# Patient Record
Sex: Female | Born: 1970 | Race: Black or African American | Hispanic: No | Marital: Married | Smoking: Never smoker
Health system: Southern US, Community
[De-identification: ages and names within clinical notes are randomized; demographics above are authoritative.]

## PROBLEM LIST (undated history)

## (undated) DIAGNOSIS — N6310 Unspecified lump in the right breast, unspecified quadrant: Secondary | ICD-10-CM

## (undated) DIAGNOSIS — N920 Excessive and frequent menstruation with regular cycle: Secondary | ICD-10-CM

## (undated) DIAGNOSIS — I10 Essential (primary) hypertension: Secondary | ICD-10-CM

## (undated) DIAGNOSIS — D219 Benign neoplasm of connective and other soft tissue, unspecified: Secondary | ICD-10-CM

## (undated) DIAGNOSIS — E559 Vitamin D deficiency, unspecified: Secondary | ICD-10-CM

## (undated) DIAGNOSIS — IMO0002 Reserved for concepts with insufficient information to code with codable children: Secondary | ICD-10-CM

## (undated) DIAGNOSIS — D649 Anemia, unspecified: Secondary | ICD-10-CM

## (undated) DIAGNOSIS — L680 Hirsutism: Secondary | ICD-10-CM

## (undated) HISTORY — DX: Unspecified lump in the right breast, unspecified quadrant: N63.10

## (undated) HISTORY — DX: Excessive and frequent menstruation with regular cycle: N92.0

## (undated) HISTORY — DX: Hirsutism: L68.0

## (undated) HISTORY — DX: Reserved for concepts with insufficient information to code with codable children: IMO0002

## (undated) HISTORY — PX: LIPOSUCTION: SHX10

## (undated) HISTORY — DX: Vitamin D deficiency, unspecified: E55.9

## (undated) HISTORY — PX: TUBAL LIGATION: SHX77

## (undated) HISTORY — PX: HYSTEROSCOPY WITH D & C: SHX1775

## (undated) HISTORY — PX: ABDOMINOPLASTY: SUR9

## (undated) HISTORY — DX: Benign neoplasm of connective and other soft tissue, unspecified: D21.9

---

## 1991-09-06 DIAGNOSIS — IMO0002 Reserved for concepts with insufficient information to code with codable children: Secondary | ICD-10-CM

## 1991-09-06 DIAGNOSIS — R87619 Unspecified abnormal cytological findings in specimens from cervix uteri: Secondary | ICD-10-CM

## 1991-09-06 HISTORY — DX: Unspecified abnormal cytological findings in specimens from cervix uteri: R87.619

## 1991-09-06 HISTORY — DX: Reserved for concepts with insufficient information to code with codable children: IMO0002

## 2008-09-05 HISTORY — PX: OTHER SURGICAL HISTORY: SHX169

## 2009-05-13 ENCOUNTER — Ambulatory Visit: Payer: Self-pay | Admitting: Family Medicine

## 2009-05-13 DIAGNOSIS — I10 Essential (primary) hypertension: Secondary | ICD-10-CM | POA: Insufficient documentation

## 2009-09-07 ENCOUNTER — Ambulatory Visit: Payer: Self-pay | Admitting: Family Medicine

## 2009-11-09 ENCOUNTER — Telehealth: Payer: Self-pay | Admitting: Family Medicine

## 2009-11-26 ENCOUNTER — Encounter (INDEPENDENT_AMBULATORY_CARE_PROVIDER_SITE_OTHER): Payer: Self-pay | Admitting: Cardiology

## 2009-11-26 ENCOUNTER — Ambulatory Visit (HOSPITAL_COMMUNITY): Admission: RE | Admit: 2009-11-26 | Discharge: 2009-11-26 | Payer: Self-pay | Admitting: Cardiology

## 2010-05-17 ENCOUNTER — Encounter: Admission: RE | Admit: 2010-05-17 | Discharge: 2010-05-17 | Payer: Self-pay | Admitting: Internal Medicine

## 2010-06-18 ENCOUNTER — Encounter: Admission: RE | Admit: 2010-06-18 | Discharge: 2010-06-18 | Payer: Self-pay | Admitting: Obstetrics and Gynecology

## 2010-09-26 ENCOUNTER — Encounter: Payer: Self-pay | Admitting: Internal Medicine

## 2010-10-05 NOTE — Letter (Signed)
Summary: Physician Authorization for Diet Program  Physician Authorization for Diet Program   Imported By: Maryln Gottron 09/11/2009 10:44:33  _____________________________________________________________________  External Attachment:    Type:   Image     Comment:   External Document

## 2010-10-05 NOTE — Assessment & Plan Note (Signed)
Summary: CONJUCTIVITIS // RS   Vital Signs:  Patient profile:   40 year old female Temp:     98.7 degrees F oral BP sitting:   140 / 90  (left arm) Cuff size:   large  Vitals Entered By: Sid Falcon LPN (September 07, 2009 11:58 AM) CC: Conjunctivitis both eyes?   History of Present Illness: Acute visit. 3 to four-day history of bilateral yellowish eye drainage and matting.   Mom and daughter with similar sxs. Denies blurred vision, nasal congestion, or fever. Used her daughter's sodium sulfate drops but had swelling of eye and severe burning. Uses contacts but keeping out since infection. No eye injury.  Problems Prior to Update: 1)  Family History Diabetes 1st Degree Relative  (ICD-V18.0) 2)  Hypertension  (ICD-401.9)  Allergies: 1)  Lisinopril (Lisinopril) 2)  Amoxicillin (Amoxicillin)  Review of Systems      See HPI  Physical Exam  General:  Well-developed,well-nourished,in no acute distress; alert,appropriate and cooperative throughout examination Head:  No preauricular adenopathy. Eyes:  mild conj erythema.  No crusted or purulent drainage at this time.pupils equal, pupils round, pupils reactive to light, corneas and lenses clear, no injection, no iris abnormalities, and no optic disk abnormalities.   Ears:  External ear exam shows no significant lesions or deformities.  Otoscopic examination reveals clear canals, tympanic membranes are intact bilaterally without bulging, retraction, inflammation or discharge. Hearing is grossly normal bilaterally. Nose:  External nasal examination shows no deformity or inflammation. Nasal mucosa are pink and moist without lesions or exudates.   Impression & Recommendations:  Problem # 1:  CONJUNCTIVITIS, BACTERIAL (ICD-372.03)  Her updated medication list for this problem includes:    Polytrim 10000-0.1 Unit/ml-% Soln (Polymyxin b-trimethoprim) .Marland Kitchen... 2 drops ou qid for 5 days  Complete Medication List: 1)  Amlodipine Besylate  5 Mg Tabs (Amlodipine besylate) .... One by mouth once daily 2)  Hydrochlorothiazide 25 Mg Tabs (Hydrochlorothiazide) .... One half tablet by mouth once daily 3)  Polytrim 10000-0.1 Unit/ml-% Soln (Polymyxin b-trimethoprim) .... 2 drops ou qid for 5 days Prescriptions: POLYTRIM 10000-0.1 UNIT/ML-% SOLN (POLYMYXIN B-TRIMETHOPRIM) 2 drops OU qid for 5 days  #96ml x 0   Entered and Authorized by:   Evelena Peat MD   Signed by:   Evelena Peat MD on 09/07/2009   Method used:   Electronically to        [facility] (retail)       [address]       [place], [place]  [address]       Ph: [phone]       Fax: [phone]   RxID:   [id number]

## 2010-10-05 NOTE — Progress Notes (Signed)
Summary: Pt has a new PCP-----FYI  Phone Note Call from Patient Call back at Home Phone [phone]   Caller: Patient----live call Summary of Call: Pt called and stated that she sees another primary care doctor. She said someone here called her and told her that she will need an ov with Dr Caryl Never. Initial call taken by: Warnell Forester,  November 09, 2009 11:05 AM  Follow-up for Phone Call        noted. Follow-up by: Evelena Peat MD,  November 09, 2009 12:52 PM

## 2011-08-12 ENCOUNTER — Emergency Department (HOSPITAL_COMMUNITY): Payer: BC Managed Care – PPO

## 2011-08-12 ENCOUNTER — Emergency Department (HOSPITAL_COMMUNITY)
Admission: EM | Admit: 2011-08-12 | Discharge: 2011-08-12 | Disposition: A | Discharge status: Home / Self Care | Payer: BC Managed Care – PPO | Attending: Emergency Medicine | Admitting: Emergency Medicine

## 2011-08-12 DIAGNOSIS — R05 Cough: Secondary | ICD-10-CM | POA: Insufficient documentation

## 2011-08-12 DIAGNOSIS — I10 Essential (primary) hypertension: Secondary | ICD-10-CM | POA: Insufficient documentation

## 2011-08-12 DIAGNOSIS — IMO0001 Reserved for inherently not codable concepts without codable children: Secondary | ICD-10-CM | POA: Insufficient documentation

## 2011-08-12 DIAGNOSIS — R5383 Other fatigue: Secondary | ICD-10-CM | POA: Insufficient documentation

## 2011-08-12 DIAGNOSIS — J069 Acute upper respiratory infection, unspecified: Secondary | ICD-10-CM | POA: Insufficient documentation

## 2011-08-12 DIAGNOSIS — R509 Fever, unspecified: Secondary | ICD-10-CM | POA: Insufficient documentation

## 2011-08-12 DIAGNOSIS — R059 Cough, unspecified: Secondary | ICD-10-CM | POA: Insufficient documentation

## 2011-08-12 DIAGNOSIS — R0602 Shortness of breath: Secondary | ICD-10-CM | POA: Insufficient documentation

## 2011-08-12 DIAGNOSIS — R51 Headache: Secondary | ICD-10-CM | POA: Insufficient documentation

## 2011-08-12 DIAGNOSIS — R5381 Other malaise: Secondary | ICD-10-CM | POA: Insufficient documentation

## 2011-08-12 HISTORY — DX: Anemia, unspecified: D64.9

## 2011-08-12 HISTORY — DX: Essential (primary) hypertension: I10

## 2011-08-12 LAB — DIFFERENTIAL
Basophils Relative: 1 % (ref 0–1)
Eosinophils Absolute: 0 10*3/uL (ref 0.0–0.7)
Eosinophils Relative: 0 % (ref 0–5)
Lymphocytes Relative: 39 % (ref 12–46)
Monocytes Relative: 9 % (ref 3–12)
Neutro Abs: 2.4 10*3/uL (ref 1.7–7.7)
Neutrophils Relative %: 51 % (ref 43–77)

## 2011-08-12 LAB — CBC
HCT: 33.3 % — ABNORMAL LOW (ref 36.0–46.0)
Hemoglobin: 11.1 g/dL — ABNORMAL LOW (ref 12.0–15.0)
RBC: 3.84 MIL/uL — ABNORMAL LOW (ref 3.87–5.11)

## 2011-08-12 LAB — PREGNANCY, URINE: Preg Test, Ur: NEGATIVE

## 2011-08-12 MED ORDER — OXYCODONE-ACETAMINOPHEN 5-325 MG PO TABS: 1.0000 | ORAL_TABLET | ORAL | Status: AC | PRN

## 2011-08-12 MED ORDER — SODIUM CHLORIDE 0.9 % IV BOLUS (SEPSIS)
500.0000 mL | Freq: Once | INTRAVENOUS | Status: AC
  Administered 2011-08-12: 500 mL via INTRAVENOUS

## 2011-08-12 NOTE — ED Notes (Signed)
Here with c/o sob and weakness as well as continued cough and congestion after week long flu and fever.  Pt denies pain at this time.

## 2011-08-12 NOTE — Discharge Instructions (Signed)

## 2011-08-12 NOTE — ED Notes (Signed)
Pt presents with 1 week h/o 5 day h/o fever and generalized body aches that began suddenly.  Pt reports cough that began yesteday, is dry, but pt reports shortness of breath.  +headaches; -nausea or vomiting.

## 2011-08-12 NOTE — ED Provider Notes (Signed)
History     CSN: [id number] Arrival date & time: 08/12/2011 12:42 PM   First MD Initiated Contact with Patient 08/12/11 1424      Chief Complaint  Patient presents with  . Fever    (Consider location/radiation/quality/duration/timing/severity/associated sxs/prior treatment) Patient is a 40 y.o. female presenting with fever. The history is provided by the patient.  Fever Primary symptoms of the febrile illness include fever, fatigue, cough, shortness of breath and myalgias. Primary symptoms do not include abdominal pain. The current episode started 3 to 5 days ago.   patient has had fever and myalgias for the last 5 days. She a cough that began yesterday. Today she has increasing shortness of breath. She does have some headaches. She states that she has a history of low iron. She states her hemoglobin is 7. She was previously on iron, but she stopped it after her period stop on spironolactone. She's since had a couple periods and was not back on the iron. She's had fatigue has not been able to walk and drive as much. No chest pain. No nausea vomiting diarrhea. No melena. No relief with cough medicine from the pharmacy.  Past Medical History  Diagnosis Date  . Hypertension   . Anemia     Past Surgical History  Procedure Date  . Cesarean section     No family history on file.  History  Substance Use Topics  . Smoking status: Never Smoker   . Smokeless tobacco: Not on file  . Alcohol Use: No    OB History    Grav Para Term Preterm Abortions TAB SAB Ect Mult Living                  Review of Systems  Constitutional: Positive for fever, appetite change and fatigue.  HENT: Negative for sore throat and neck pain.   Respiratory: Positive for cough and shortness of breath.   Cardiovascular: Negative for chest pain.  Gastrointestinal: Negative for abdominal pain and constipation.  Genitourinary: Negative for vaginal bleeding.  Musculoskeletal: Positive for myalgias. Negative  for back pain.  Neurological: Negative for numbness.  Psychiatric/Behavioral: Negative for confusion.    Allergies  Amoxicillin; Lisinopril; and Morphine and related  Home Medications   Current Outpatient Rx  Name Route Sig Dispense Refill  . BISACODYL 5 MG PO TBEC Oral Take 10 mg by mouth daily as needed. constipation     . DILTIAZEM HCL ER 180 MG PO CP24 Oral Take 180 mg by mouth daily.      Arnette Schaumann PLUS PO Oral Take 1 capsule by mouth daily.      Marland Kitchen SPIRONOLACTONE 50 MG PO TABS Oral Take 25 mg by mouth 2 (two) times daily.      . OXYCODONE-ACETAMINOPHEN 5-325 MG PO TABS Oral Take 1 tablet by mouth every 4 (four) hours as needed for pain. 15 tablet 0    BP 133/82  Pulse 64  Temp(Src) 98 F (36.7 C) (Oral)  Resp 15  Ht 5\' 7"  (1.702 m)  Wt 162 lb (73.483 kg)  BMI 25.37 kg/m2  SpO2 100%  LMP 08/12/2011  Physical Exam  Nursing note and vitals reviewed. Constitutional: She is oriented to person, place, and time. She appears well-developed and well-nourished.  HENT:  Head: Normocephalic and atraumatic.  Mouth/Throat: No oropharyngeal exudate.       Mild posterior pharyngeal erythema without exudate.  Eyes: EOM are normal. Pupils are equal, round, and reactive to light.  Neck: Normal range of motion.  Neck supple.  Cardiovascular: Normal rate, regular rhythm and normal heart sounds.   No murmur heard. Pulmonary/Chest: Effort normal and breath sounds normal. No respiratory distress. She has no wheezes. She has no rales.  Abdominal: Soft. Bowel sounds are normal. She exhibits no distension. There is no tenderness. There is no rebound and no guarding.  Musculoskeletal: Normal range of motion.  Neurological: She is alert and oriented to person, place, and time. No cranial nerve deficit.  Skin: Skin is warm and dry.  Psychiatric: She has a normal mood and affect. Her speech is normal.    ED Course  Procedures (including critical care time)  Labs Reviewed  CBC - Abnormal;  Notable for the following:    RBC 3.84 (*)    Hemoglobin 11.1 (*)    HCT 33.3 (*)    All other components within normal limits  DIFFERENTIAL  PREGNANCY, URINE   Dg Chest 2 View  08/12/2011  *RADIOLOGY REPORT*  Clinical Data: Fever and shortness of breath.  CHEST - 2 VIEW  Comparison: None.  Findings: Lungs are clear.  Heart size is normal.  No pneumothorax or pleural effusion.  IMPRESSION: Negative chest.  Original Report Authenticated By: Bernadene Bell. D'ALESSIO, M.D.     1. URI (upper respiratory infection)       MDM  Cough and fevers. Reassuring x-ray. History of anemia hemoglobin is now 11. She'll be discharged home.        Juliet Rude. Rubin Payor, MD 08/12/11 [phone]

## 2011-09-09 ENCOUNTER — Other Ambulatory Visit: Payer: Self-pay | Admitting: Obstetrics and Gynecology

## 2011-09-09 DIAGNOSIS — N6310 Unspecified lump in the right breast, unspecified quadrant: Secondary | ICD-10-CM

## 2011-09-15 ENCOUNTER — Ambulatory Visit
Admission: RE | Admit: 2011-09-15 | Discharge: 2011-09-15 | Disposition: A | Discharge status: Home / Self Care | Payer: BC Managed Care – PPO | Source: Ambulatory Visit | Attending: Obstetrics and Gynecology | Admitting: Obstetrics and Gynecology

## 2011-09-15 ENCOUNTER — Other Ambulatory Visit: Payer: Self-pay | Admitting: Obstetrics and Gynecology

## 2011-09-15 DIAGNOSIS — N6310 Unspecified lump in the right breast, unspecified quadrant: Secondary | ICD-10-CM

## 2011-09-18 DIAGNOSIS — N63 Unspecified lump in unspecified breast: Secondary | ICD-10-CM | POA: Insufficient documentation

## 2012-04-16 ENCOUNTER — Other Ambulatory Visit: Payer: Self-pay | Admitting: Obstetrics and Gynecology

## 2012-04-18 ENCOUNTER — Other Ambulatory Visit: Payer: Self-pay

## 2012-04-18 ENCOUNTER — Other Ambulatory Visit: Payer: Self-pay | Admitting: Obstetrics and Gynecology

## 2012-04-18 MED ORDER — INTEGRA PLUS PO CAPS: 1.0000 | ORAL_CAPSULE | Freq: Every day | ORAL | Status: DC

## 2012-04-18 NOTE — Telephone Encounter (Signed)
Spoke with pt informed rx sent to pharm pt voice understanding 

## 2012-04-18 NOTE — Telephone Encounter (Signed)
NIC[facility]LE/RX °

## 2012-05-22 ENCOUNTER — Encounter: Payer: Self-pay | Admitting: Obstetrics and Gynecology

## 2012-05-30 ENCOUNTER — Other Ambulatory Visit: Payer: Self-pay

## 2012-05-30 MED ORDER — FERROUS SULFATE 325 (65 FE) MG PO TABS: ORAL_TABLET | ORAL | Status: DC

## 2012-06-07 ENCOUNTER — Ambulatory Visit (INDEPENDENT_AMBULATORY_CARE_PROVIDER_SITE_OTHER): Payer: BC Managed Care – PPO | Admitting: Obstetrics and Gynecology

## 2012-06-07 ENCOUNTER — Encounter: Payer: Self-pay | Admitting: Obstetrics and Gynecology

## 2012-06-07 VITALS — BP 140/82 | Wt 187.0 lb

## 2012-06-07 DIAGNOSIS — N920 Excessive and frequent menstruation with regular cycle: Secondary | ICD-10-CM

## 2012-06-07 NOTE — Progress Notes (Signed)
When did bleeding start: 05/31/12 How  Long: 5 days How often changing pad/tampon: every 2 hours Bleeding Disorders: yes anemic Cramping: yes Contraception: yes BTL Fibroids: yes Hormone Therapy: no New Medications: no Menopausal Symptoms: no Vag. Discharge: no Abdominal Pain: no Increased Stress: no Pt states that her periods last the same amount of days as normal but are much heavier. She changes an ultra tampon q 1-2 hrs.  She is anemic.  She never took the lysteda and her menses is q 18-27 days.   Physical Examination: General appearance - alert, well appearing, and in no distress Heart - normal rate and regular rhythm Abdomen - soft, nontender, nondistended, no masses or organomegaly Pelvic - normal external genitalia, vulva, vagina, cervix, uterus and adnexa Menorrhagia EMBX/SHG @[place]  micronor to help stop the bleeding

## 2012-06-11 ENCOUNTER — Other Ambulatory Visit: Payer: Self-pay | Admitting: Obstetrics and Gynecology

## 2012-06-11 ENCOUNTER — Telehealth: Payer: Self-pay

## 2012-06-11 MED ORDER — NORETHINDRONE 0.35 MG PO TABS: 1.0000 | ORAL_TABLET | Freq: Every day | ORAL | Status: DC

## 2012-06-11 NOTE — Telephone Encounter (Signed)
VM message from pt requesting rx ND was supposed to send after 06/07/12 visit. Pt didn't indicate which med.

## 2012-06-11 NOTE — Telephone Encounter (Signed)
Tc to pt regarding msg.  Lm on vm that Micronor has been e-prescribed to pt's pharmacy, pt to call office with any questions.

## 2012-06-13 ENCOUNTER — Telehealth: Payer: Self-pay

## 2012-06-13 NOTE — Telephone Encounter (Signed)
Spoke with pt informed her [facility] was [facility] per [facility] and to call back if she has any other issues. Pt agrees and understands.

## 2012-06-13 NOTE — Telephone Encounter (Signed)
VM from pt. Pt states pharmacy didn't receive rx for Micronor.

## 2012-09-20 ENCOUNTER — Ambulatory Visit: Payer: BC Managed Care – PPO | Admitting: Obstetrics and Gynecology

## 2012-09-20 ENCOUNTER — Encounter: Payer: Self-pay | Admitting: Obstetrics and Gynecology

## 2012-09-20 ENCOUNTER — Ambulatory Visit: Payer: BC Managed Care – PPO

## 2012-09-20 VITALS — BP 140/82 | HR 84 | Wt 181.0 lb

## 2012-09-20 DIAGNOSIS — D219 Benign neoplasm of connective and other soft tissue, unspecified: Secondary | ICD-10-CM

## 2012-09-20 DIAGNOSIS — N920 Excessive and frequent menstruation with regular cycle: Secondary | ICD-10-CM

## 2012-09-20 LAB — POCT URINE PREGNANCY: Preg Test, Ur: NEGATIVE

## 2012-09-20 NOTE — Addendum Note (Signed)
Addended by: Henreitta Leber on: 09/20/2012 01:53 PM   Modules accepted: Orders

## 2012-09-20 NOTE — Patient Instructions (Signed)
Uterine Artery Embolization for Fibroids Uterine fibroids are non-cancerous (benign) smooth muscle tumors of the uterus. When they become large, they may produce symptoms of pain and bleeding. Fibroids are sometimes individually removed during surgery or removed with the uterus (hysterectomy).  One non-surgical treatment used to shrink fibroids is called uterine artery embolization. A specialist (interventional radiologist) uses a thin plastic hose(catheter) to inject material that blocks off the blood supply to the fibroid. In time, this causes the fibroid to shrink. PROCEDURE  Under local anesthetic (a medication that numbs part of the body) the radiologist makes a small cut in the groin. A catheter is then inserted into the main artery of the leg. Using fluoroscopy, your radiologist guides the catheter through the artery to the uterus. A series of images are taken while dye is injected. This is done to provide a road map of the blood supply to the uterus and fibroids. Tiny plastic spheres about the size of sand grains are then injected through the catheter. Metal coils may sometimes also be used to help block the artery. The particles lodge in tiny branches of the uterine artery that supplies blood to the fibroids. The procedure is repeated on the artery that supplies the other side of the uterus. The hospital stay is usually overnight. Normal activity can resume after about a week. Mild pain and cramping following the procedure is easily treated with medication and anti-inflammatory drugs. These usually last only a couple days.  RISKS AND COMPLICATIONS  Injury to the uterus from decreased blood supply may happen.  This could require removal of the uterus (hysterectomy).  Pain and bleeding can occur.  Infection and abscess (a cyst filled with pus).  A cyst filled with blood (hematoma).  Blood infection (septicemia).  Amenorrhea (no menstrual period).  Dying of tissue cells that cannot recover  (necrosis) to the bladder or lips of the vagina.  Fistula (a connection between organs or from organ to the skin).  Blood clot in the lung (pulmonary embolus).  Rarely death. EXPECTED OUTCOME An ultrasound or MRI is done in 6 months to make sure the fibroids have shrunk. The fibroids usually shrink to about half their original size. In most cases these effects are long lasting.   The uterus also shrinks but does not die. You may not be able to get pregnant following this procedure.  It cannot be estimated what the effects of the procedure will be on menses. Usually there is less bleeding.  The procedure may cause premature menopause or loss of menstrual cycle. HOME CARE INSTRUCTIONS   Follow your caregiver's advice regarding medications given to you, diet, activity and when to begin sexual activity.  See your caregiver for follow up care as directed.  Do not take aspirin it can cause bleeding. Only take over-the-counter or prescription medicines for pain, discomfort, or fever as directed by your caregiver.  Care for and change dressing as directed. SEEK MEDICAL CARE IF:   You develop a temperature of 102 F (38.9 C) or higher.  There is redness, swelling and pain around the wound.  You have pus draining from the wound.  You develop a rash. SEEK IMMEDIATE MEDICAL CARE IF:   You have bleeding from the wound.  You have difficulty breathing.  You develop chest pain.  You develop belly (abdominal) pain.  You develop leg pain.  You become dizzy and pass out. Document Released: 11/07/2005 Document Revised: 11/14/2011 Document Reviewed: 10/04/2007 [facility] Patient Information 2013 [place], [place].

## 2012-09-20 NOTE — Progress Notes (Addendum)
42 YO with fibroids  presents for a sono-hysterogram and endometrial biopsy because of persistent menorrhagia. Patient previously had been given management options to include ablation, Mirena, fibroid embolization, myomectomy and hormonal therapy however she wants to manage her fibroids and not just her bleeding.  O: U/S-SHG-uterus 9.43 x 8.92 x 7.31 cm with normal appearing ovaries;  #3 measurable fibroids: anterior right myometrial 6.0 x 5.5 x 5.4 cm; anterior fundal?subserosal: 3.4 x 3.8 x 3.7 cm; and anterior mid-uterine-myometrial 1.8 x 1.6 x 1.9 cm; Saline infusion of uterine cavity did not reveal any focal lesions, normal cavity shape and endometrial walls appear smooth (confirmed by 3D imaging)  UPT-negative  A: Menorrhagia     Fibroids     SHG & Endometrial Biopsy     S/P Tubal Sterilization  P:  Patient states she wants fibroid embolization-referrred for consult with interventional radiologist       Endometrial biopsy sampling to pathology-pending       RTO-AEx with Dr. Normand Sloop or prn  Henreitta Leber, PA-C

## 2012-09-24 LAB — PATHOLOGY

## 2012-10-01 ENCOUNTER — Telehealth: Payer: Self-pay

## 2012-10-01 DIAGNOSIS — D219 Benign neoplasm of connective and other soft tissue, unspecified: Secondary | ICD-10-CM

## 2012-10-01 NOTE — Telephone Encounter (Signed)
Spoke with pt informed lab results informed referral to dr.yamatgata for [place] pt has appt 10/02/12 at 10:00 pt voice understanding pt will call to r/s appt if unable to get off work

## 2012-10-02 ENCOUNTER — Inpatient Hospital Stay: Admission: RE | Admit: 2012-10-02 | Payer: BC Managed Care – PPO | Source: Ambulatory Visit

## 2012-10-10 ENCOUNTER — Telehealth: Payer: Self-pay

## 2012-10-10 NOTE — Telephone Encounter (Signed)
VM from pt request Intergra Plus rf. Lm for pt to c/b.

## 2012-10-11 ENCOUNTER — Telehealth: Payer: Self-pay

## 2012-10-11 NOTE — Telephone Encounter (Signed)
Please see msg

## 2012-10-16 NOTE — Telephone Encounter (Signed)
Spoke with pt requesting Integra Plus. Pt needs labs before ND is able to refill her iron per NG. Pt has an appt Feb. 27th & ND will evaluate her then. Pt agrees and has no further questions.

## 2012-11-01 ENCOUNTER — Ambulatory Visit: Payer: BC Managed Care – PPO | Admitting: Obstetrics and Gynecology

## 2012-11-02 ENCOUNTER — Telehealth: Payer: Self-pay | Admitting: Obstetrics and Gynecology

## 2012-11-02 NOTE — Telephone Encounter (Signed)
Call transferred to MB for scheduling.

## 2012-11-05 ENCOUNTER — Encounter: Payer: BC Managed Care – PPO | Admitting: Obstetrics and Gynecology

## 2012-11-07 ENCOUNTER — Encounter: Payer: Self-pay | Admitting: Certified Nurse Midwife

## 2012-11-07 ENCOUNTER — Ambulatory Visit: Payer: BC Managed Care – PPO | Admitting: Certified Nurse Midwife

## 2012-11-07 VITALS — BP 130/70 | Ht 67.0 in | Wt 186.0 lb

## 2012-11-07 DIAGNOSIS — N926 Irregular menstruation, unspecified: Secondary | ICD-10-CM

## 2012-11-08 ENCOUNTER — Telehealth: Payer: Self-pay | Admitting: Obstetrics and Gynecology

## 2012-11-08 MED ORDER — METRONIDAZOLE 500 MG PO TABS: 500.0000 mg | ORAL_TABLET | Freq: Three times a day (TID) | ORAL | Status: AC

## 2012-11-08 MED ORDER — FLUCONAZOLE 100 MG PO TABS: ORAL_TABLET | ORAL | Status: DC

## 2012-11-08 NOTE — Telephone Encounter (Signed)
Pt was seen by CA 11-07-2012 Pt is calling regarding Rx not being sent There is not a completed office note by CA in pt chart. Will have to consult w/ CA today  Pt called to be made aware  Pt not ava left message on pt voicemail  LC CMA

## 2012-11-25 NOTE — Progress Notes (Signed)
Pt. Here with c/o vagina d/c with irritation No vaginal lesions noted Cycles irreg. At times Pap is due O Alert and oriented  Thyroid nl  Heart and lungs nl  Back: no CVAT  Abd: soft and non tender  Bimanual - creamy whitish d/c without lesions   Pelvic WNL A Vaginitis  P Condoms encouraged  Pap done  Wet Prep done-Yeast  Diflucan 150 mgs

## 2013-01-07 ENCOUNTER — Other Ambulatory Visit: Payer: Self-pay

## 2013-01-07 DIAGNOSIS — Z1231 Encounter for screening mammogram for malignant neoplasm of breast: Secondary | ICD-10-CM

## 2013-02-12 ENCOUNTER — Ambulatory Visit: Payer: BC Managed Care – PPO

## 2013-05-24 ENCOUNTER — Ambulatory Visit: Payer: BC Managed Care – PPO

## 2013-06-07 ENCOUNTER — Ambulatory Visit: Payer: BC Managed Care – PPO

## 2013-06-14 ENCOUNTER — Ambulatory Visit
Admission: RE | Admit: 2013-06-14 | Discharge: 2013-06-14 | Disposition: A | Discharge status: Home / Self Care | Payer: BC Managed Care – PPO | Source: Ambulatory Visit

## 2013-06-14 DIAGNOSIS — Z1231 Encounter for screening mammogram for malignant neoplasm of breast: Secondary | ICD-10-CM

## 2013-10-09 ENCOUNTER — Ambulatory Visit (INDEPENDENT_AMBULATORY_CARE_PROVIDER_SITE_OTHER): Payer: BC Managed Care – PPO | Admitting: Emergency Medicine

## 2013-10-09 VITALS — BP 156/88 | HR 86 | Temp 98.6°F | Resp 17 | Ht 67.0 in | Wt 185.0 lb

## 2013-10-09 DIAGNOSIS — J020 Streptococcal pharyngitis: Secondary | ICD-10-CM

## 2013-10-09 DIAGNOSIS — J029 Acute pharyngitis, unspecified: Secondary | ICD-10-CM

## 2013-10-09 LAB — POCT RAPID STREP A (OFFICE): RAPID STREP A SCREEN: POSITIVE — AB

## 2013-10-09 MED ORDER — CLINDAMYCIN HCL 300 MG PO CAPS: 300.0000 mg | ORAL_CAPSULE | Freq: Three times a day (TID) | ORAL | Status: DC

## 2013-10-09 NOTE — Patient Instructions (Signed)
Strep Throat  Strep throat is an infection of the throat caused by a bacteria named Streptococcus pyogenes. Your caregiver may call the infection streptococcal "tonsillitis" or "pharyngitis" depending on whether there are signs of inflammation in the tonsils or back of the throat. Strep throat is most common in children aged 44 15 years during the cold months of the year, but it can occur in people of any age during any season. This infection is spread from person to person (contagious) through coughing, sneezing, or other close contact.  SYMPTOMS   · Fever or chills.  · Painful, swollen, red tonsils or throat.  · Pain or difficulty when swallowing.  · White or yellow spots on the tonsils or throat.  · Swollen, tender lymph nodes or "glands" of the neck or under the jaw.  · Red rash all over the body (rare).  DIAGNOSIS   Many different infections can cause the same symptoms. A test must be done to confirm the diagnosis so the right treatment can be given. A "rapid strep test" can help your caregiver make the diagnosis in a few minutes. If this test is not available, a light swab of the infected area can be used for a throat culture test. If a throat culture test is done, results are usually available in a day or two.  TREATMENT   Strep throat is treated with antibiotic medicine.  HOME CARE INSTRUCTIONS   · Gargle with 1 tsp of salt in 1 cup of warm water, 3 4 times per day or as needed for comfort.  · Family members who also have a sore throat or fever should be tested for strep throat and treated with antibiotics if they have the strep infection.  · Make sure everyone in your household washes their hands well.  · Do not share food, drinking cups, or personal items that could cause the infection to spread to others.  · You may need to eat a soft food diet until your sore throat gets better.  · Drink enough water and fluids to keep your urine clear or pale yellow. This will help prevent dehydration.  · Get plenty of  rest.  · Stay home from school, daycare, or work until you have been on antibiotics for 24 hours.  · Only take over-the-counter or prescription medicines for pain, discomfort, or fever as directed by your caregiver.  · If antibiotics are prescribed, take them as directed. Finish them even if you start to feel better.  SEEK MEDICAL CARE IF:   · The glands in your neck continue to enlarge.  · You develop a rash, cough, or earache.  · You cough up green, yellow-brown, or bloody sputum.  · You have pain or discomfort not controlled by medicines.  · Your problems seem to be getting worse rather than better.  SEEK IMMEDIATE MEDICAL CARE IF:   · You develop any new symptoms such as vomiting, severe headache, stiff or painful neck, chest pain, shortness of breath, or trouble swallowing.  · You develop severe throat pain, drooling, or changes in your voice.  · You develop swelling of the neck, or the skin on the neck becomes red and tender.  · You have a fever.  · You develop signs of dehydration, such as fatigue, dry mouth, and decreased urination.  · You become increasingly sleepy, or you cannot wake up completely.  Document Released: 08/19/2000 Document Revised: 08/08/2012 Document Reviewed: 10/21/2010  Exit[employer]® Patient Information ©2014 [employer].

## 2013-10-09 NOTE — Progress Notes (Signed)
[facility] [address], [place]  [address] [phone]  Date:  10/09/2013   Name:  Caitlin Stark   DOB:  03-08-1971   MRN:  [id number]  PCP:  Eulas Post, MD    Chief Complaint: Sore Throat   History of Present Illness:  Caitlin Stark is a 43 y.o. very pleasant female patient who presents with the following:  Treated for strep and completed antibiotic 2 weeks ago.   Was given clindamycin.  Now has recurrent symptoms of sore throat.  No cough or coryza.  No fever but chilled.  No nausea or vomiting.  No stool change or rash.  No improvement with over the counter medications or other home remedies. Denies other complaint or health concern today.   Patient Active Problem List   Diagnosis Date Noted  . HYPERTENSION 05/13/2009    Past Medical History  Diagnosis Date  . Hypertension   . Anemia   . Menorrhagia   . Fibroid   . Hirsutism   . Breast mass, right   . Abnormal Pap smear 1993    Past Surgical History  Procedure Laterality Date  . Cesarean section    . Tubal ligation    . Hysteroscopy w/d&c    . Abdominoplasty    . Liposuction    . Diastasis repair  2010    History  Substance Use Topics  . Smoking status: Never Smoker   . Smokeless tobacco: Not on file  . Alcohol Use: No    Family History  Problem Relation Age of Onset  . Hypertension Other   . Anemia Other   . Autoimmune disease Other     Allergies  Allergen Reactions  . Amoxicillin     REACTION: eyes swelling  . Lisinopril     REACTION: swelling of eyes and lower lip  . Morphine And Related Hives    Medication list has been reviewed and updated.  Current Outpatient Prescriptions on File Prior to Visit  Medication Sig Dispense Refill  . bisacodyl (DULCOLAX) 5 MG EC tablet Take 10 mg by mouth daily as needed. constipation       . fluconazole (DIFLUCAN) 100 MG tablet Diflucan 150 mgs  1 tablet  1  . diltiazem (DILACOR XR) 180 MG 24 hr capsule Take 180 mg  by mouth daily.         No current facility-administered medications on file prior to visit.    Review of Systems:  As per HPI, otherwise negative.    Physical Examination: Filed Vitals:   10/09/13 0956  BP: 156/88  Pulse: 86  Temp: 98.6 F (37 C)  Resp: 17   Filed Vitals:   10/09/13 0956  Height: 5\' 7"  (1.702 m)  Weight: 185 lb (83.915 kg)   Body mass index is 28.97 kg/(m^2). Ideal Body Weight: Weight in (lb) to have BMI = 25: 159.3  GEN: WDWN, NAD, Non-toxic, A & O x 3 HEENT: Atraumatic, Normocephalic. Neck supple. No masses, No LAD. Ears and Nose: No external deformity. CV: RRR, No M/G/R. No JVD. No thrill. No extra heart sounds. PULM: CTA B, no wheezes, crackles, rhonchi. No retractions. No resp. distress. No accessory muscle use. ABD: S, NT, ND, +BS. No rebound. No HSM. EXTR: No c/c/e NEURO Normal gait.  PSYCH: Normally interactive. Conversant. Not depressed or anxious appearing.  Calm demeanor.    Assessment and Plan: Strep throat  Signed,  Ellison Carwin, MD

## 2014-06-19 ENCOUNTER — Other Ambulatory Visit: Payer: Self-pay

## 2014-06-19 DIAGNOSIS — Z1239 Encounter for other screening for malignant neoplasm of breast: Secondary | ICD-10-CM

## 2014-07-09 ENCOUNTER — Ambulatory Visit: Payer: BC Managed Care – PPO

## 2014-07-25 ENCOUNTER — Other Ambulatory Visit: Payer: Self-pay

## 2014-07-25 DIAGNOSIS — Z1231 Encounter for screening mammogram for malignant neoplasm of breast: Secondary | ICD-10-CM

## 2014-07-30 ENCOUNTER — Ambulatory Visit
Admission: RE | Admit: 2014-07-30 | Discharge: 2014-07-30 | Disposition: A | Discharge status: Home / Self Care | Payer: BC Managed Care – PPO | Source: Ambulatory Visit

## 2014-07-30 DIAGNOSIS — Z1231 Encounter for screening mammogram for malignant neoplasm of breast: Secondary | ICD-10-CM

## 2014-12-10 ENCOUNTER — Other Ambulatory Visit: Payer: Self-pay | Admitting: Internal Medicine

## 2014-12-10 ENCOUNTER — Ambulatory Visit
Admission: RE | Admit: 2014-12-10 | Discharge: 2014-12-10 | Disposition: A | Discharge status: Home / Self Care | Payer: BC Managed Care – PPO | Source: Ambulatory Visit | Attending: Internal Medicine | Admitting: Internal Medicine

## 2014-12-10 DIAGNOSIS — R059 Cough, unspecified: Secondary | ICD-10-CM

## 2014-12-10 DIAGNOSIS — R05 Cough: Secondary | ICD-10-CM

## 2014-12-10 DIAGNOSIS — M791 Myalgia, unspecified site: Secondary | ICD-10-CM

## 2016-10-17 ENCOUNTER — Encounter: Payer: Self-pay | Admitting: Podiatry

## 2016-10-17 ENCOUNTER — Ambulatory Visit: Payer: BC Managed Care – PPO

## 2016-10-17 ENCOUNTER — Ambulatory Visit (INDEPENDENT_AMBULATORY_CARE_PROVIDER_SITE_OTHER): Payer: BC Managed Care – PPO

## 2016-10-17 ENCOUNTER — Ambulatory Visit (INDEPENDENT_AMBULATORY_CARE_PROVIDER_SITE_OTHER): Payer: BC Managed Care – PPO | Admitting: Podiatry

## 2016-10-17 DIAGNOSIS — M79671 Pain in right foot: Secondary | ICD-10-CM | POA: Diagnosis not present

## 2016-10-17 DIAGNOSIS — M722 Plantar fascial fibromatosis: Secondary | ICD-10-CM

## 2016-10-17 DIAGNOSIS — M79672 Pain in left foot: Secondary | ICD-10-CM

## 2016-10-17 MED ORDER — MELOXICAM 15 MG PO TABS: 15.0000 mg | ORAL_TABLET | Freq: Every day | ORAL | 2 refills | Status: DC

## 2016-10-17 MED ORDER — TRIAMCINOLONE ACETONIDE 10 MG/ML IJ SUSP
10.0000 mg | Freq: Once | INTRAMUSCULAR | Status: AC
  Administered 2016-10-17: 10 mg

## 2016-10-17 NOTE — Patient Instructions (Signed)

## 2016-10-17 NOTE — Progress Notes (Signed)
   Subjective:    Patient ID: Caitlin Stark, female    DOB: 05-Jun-1971, 46 y.o.   MRN: [id number]  HPI  46 year old female presents the office they for complaints of bilateral foot pain the left side worse than the right. She has had  Pain for about 1 month and she believes this may have started from an injury although she cannot recall a specific injury which started her symptoms. The only injury she can recall stepping on an uneven surface on the ground but she did not have any sudden pain after this. 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 She describes as a throbbing sensation to the plantar aspect of her heel. She's had no recent treatment for this. No other complaints at this time. Review of Systems  All other systems reviewed and are negative.      Objective:   Physical Exam General: AAO x3, NAD  Dermatological: Skin is warm, dry and supple bilateral. Nails x 10 are well manicured; remaining integument appears unremarkable at this time. There are no open sores, no preulcerative lesions, no rash or signs of infection present.  Vascular: Dorsalis Pedis artery and Posterior Tibial artery pedal pulses are 2/4 bilateral with immedate capillary fill time. There is no pain with calf compression, swelling, warmth, erythema.   Neruologic: Grossly intact via light touch bilateral. Vibratory intact via tuning fork bilateral. Protective threshold with Semmes Wienstein monofilament intact to all pedal sites bilateral. Negative tinel sign.   Musculoskeletal: Tenderness to palpation along the plantar medial tubercle of the calcaneus at the insertion of plantar fascia on the left >> right foot. There is no pain along the course of the plantar fascia within the arch of the foot. Plantar fascia appears to be intact. There is no pain with lateral compression of the calcaneus or pain with vibratory  sensation. There is no pain along the course or insertion of the achilles tendon. No other areas of tenderness to bilateral lower extremities. Muscular strength 5/5 in all groups tested bilateral.  Gait: Unassisted, Nonantalgic.     Assessment & Plan:  46 year old female with bilateral heel pain with the left side >> right; plantar fasciitis  -Treatment options discussed including all alternatives, risks, and complications -Etiology of symptoms were discussed -X-rays were obtained and reviewed with the patient. No evidence of acute fracture -Patient elects to proceed with steroid injection into the left heel. Under sterile skin preparation, a total of 2.5cc of kenalog 10, 0.5% Marcaine plain, and 2% lidocaine plain were infiltrated into the symptomatic area without complication. A band-aid was applied. Patient tolerated the injection well without complication. Post-injection care with discussed with the patient. Discussed with the patient to ice the area over the next couple of days to help prevent a steroid flare.  -Plantar fascial brace bilaterally -Prescribed mobic. Discussed side effects of the medication and directed to stop if any are to occur and call the office.  -She was scanned for orthotics but will check insurance and let her know. She will think about getting these.  -Stretching and icing daily.  -Discussed shoe changes.  -Follow-up in 3 weeks or sooner if any problems arise. In the meantime, encouraged to call the office with any questions, concerns, change in symptoms.   Celesta Gentile, DPM

## 2016-10-19 ENCOUNTER — Telehealth: Payer: Self-pay | Admitting: *Deleted

## 2016-10-19 NOTE — Telephone Encounter (Signed)
Called patient and left a message stating that her insurance pays at 80% and 20% is patient responsibility and to call the office back to let us know if she wants them or just to hold off in getting the inserts. Lattie Haw

## 2017-01-01 ENCOUNTER — Emergency Department (HOSPITAL_COMMUNITY): Payer: BC Managed Care – PPO

## 2017-01-01 ENCOUNTER — Encounter (HOSPITAL_COMMUNITY): Payer: Self-pay | Admitting: Emergency Medicine

## 2017-01-01 ENCOUNTER — Emergency Department (HOSPITAL_COMMUNITY)
Admission: EM | Admit: 2017-01-01 | Discharge: 2017-01-01 | Disposition: A | Discharge status: Home / Self Care | Payer: BC Managed Care – PPO | Attending: Emergency Medicine | Admitting: Emergency Medicine

## 2017-01-01 DIAGNOSIS — M546 Pain in thoracic spine: Secondary | ICD-10-CM | POA: Diagnosis not present

## 2017-01-01 DIAGNOSIS — Z79899 Other long term (current) drug therapy: Secondary | ICD-10-CM | POA: Diagnosis not present

## 2017-01-01 DIAGNOSIS — I10 Essential (primary) hypertension: Secondary | ICD-10-CM | POA: Diagnosis not present

## 2017-01-01 LAB — I-STAT TROPONIN, ED: TROPONIN I, POC: 0 ng/mL (ref 0.00–0.08)

## 2017-01-01 MED ORDER — NAPROXEN 500 MG PO TABS: 500.0000 mg | ORAL_TABLET | Freq: Two times a day (BID) | ORAL | 0 refills | Status: DC

## 2017-01-01 MED ORDER — METHOCARBAMOL 500 MG PO TABS: 1000.0000 mg | ORAL_TABLET | Freq: Four times a day (QID) | ORAL | 0 refills | Status: DC

## 2017-01-01 NOTE — ED Triage Notes (Signed)
Pt reports having sharp stabbing pain in right back that began suddenly tonight. Pt also reports that pain is worse with movement.

## 2017-01-01 NOTE — ED Notes (Signed)
Pt denies any heavy lifting, pulling, or urinary symptoms.

## 2017-01-01 NOTE — Discharge Instructions (Signed)
Please read and follow all provided instructions.  Your diagnoses today include:  1. Acute right-sided thoracic back pain     Tests performed today include:  Vital signs - see below for your results today  EKG - abnormal but no new changes suspected  Troponin - no signs of heart muscle irritation  Chest x-ray - no pneumonia or other problems with the lungs or bones  Medications prescribed:   Naproxen - anti-inflammatory pain medication  Do not exceed 500mg  naproxen every 12 hours, take with food  You have been prescribed an anti-inflammatory medication or NSAID. Take with food. Take smallest effective dose for the shortest duration needed for your pain. Stop taking if you experience stomach pain or vomiting.    Robaxin (methocarbamol) - muscle relaxer medication  DO NOT drive or perform any activities that require you to be awake and alert because this medicine can make you drowsy.   Take any prescribed medications only as directed.  Home care instructions:   Follow any educational materials contained in this packet  Please rest, use ice or heat on your back for the next several days  Do not lift, push, pull anything more than 10 pounds for the next week  Follow-up instructions: Please follow-up with your primary care provider in the next 1 week for further evaluation of your symptoms if not improved.   Return instructions:  SEEK IMMEDIATE MEDICAL ATTENTION IF YOU HAVE:  New numbness, tingling, weakness, or problem with the use of your arms or legs  Severe back pain not relieved with medications  Loss control of your bowels or bladder  Increasing pain in any areas of the body (such as chest or abdominal pain)  Shortness of breath, dizziness, or fainting.   Worsening nausea (feeling sick to your stomach), vomiting, fever, or sweats  Any other emergent concerns regarding your health   Additional Information:  Your vital signs today were: BP 136/84 (BP  Location: Left Arm)    Pulse 64    Temp 97.9 F (36.6 C) (Oral)    Resp 16    Ht 5\' 7"  (1.702 m)    Wt 79.4 kg    LMP 09/10/2012    SpO2 100%    BMI 27.41 kg/m  If your blood pressure (BP) was elevated above 135/85 this visit, please have this repeated by your doctor within one month. --------------

## 2017-01-01 NOTE — ED Notes (Signed)
Patient transported to X-ray 

## 2017-01-01 NOTE — ED Provider Notes (Signed)
[facility] DEPT Provider Note   CSN: [id number] Arrival date & time: 01/01/17  0157     History   Chief Complaint Chief Complaint  Patient presents with  . Back Pain    HPI Caitlin Stark is a 46 y.o. female.  Patient with history of abnormal EKG presents with complaint of acute onset of right thoracic back pain which awoke her from sleep at 10:30 PM. Patient denies preceding injuries. She has not had pain like this in the past. It is sharp in nature and changes with position. She denies any associated chest pains, shortness of breath. No lightheadedness or syncope. Pain is not made worse with deep breathing. No recent fevers or cough. She took Tylenol which did not improve her symptoms. No abdominal pain including right upper quadrant pain, vomiting. No history of cholecystectomy. Patient denies risk factors for pulmonary embolism including: unilateral leg swelling, history of DVT/PE/other blood clots, use of exogenous hormones, recent immobilizations, recent surgery, recent travel (>4hr segment), malignancy, hemoptysis. The course is constant. Aggravating factors: none. Alleviating factors: none.         Past Medical History:  Diagnosis Date  . Abnormal Pap smear 1993  . Anemia   . Breast mass, right   . Fibroid   . Hirsutism   . Hypertension   . Menorrhagia     Patient Active Problem List   Diagnosis Date Noted  . HYPERTENSION 05/13/2009    Past Surgical History:  Procedure Laterality Date  . ABDOMINOPLASTY    . CESAREAN SECTION    . Diastasis Repair  2010  . HYSTEROSCOPY W/D&C    . LIPOSUCTION    . TUBAL LIGATION      OB History    Gravida Para Term Preterm AB Living   2 2       2    SAB TAB Ectopic Multiple Live Births                   Home Medications    Prior to Admission medications   Medication Sig Start Date End Date Taking? Authorizing Provider  bisacodyl (DULCOLAX) 5 MG EC tablet Take 10 mg by mouth daily as needed. constipation      Historical Provider, MD  diltiazem (DILACOR XR) 180 MG 24 hr capsule Take 180 mg by mouth daily.      Historical Provider, MD  MATZIM LA 180 MG 24 hr tablet  10/10/16   Historical Provider, MD  meloxicam (MOBIC) 15 MG tablet Take 1 tablet (15 mg total) by mouth daily. 10/17/16 10/17/17  Trula Slade, DPM  methocarbamol (ROBAXIN) 500 MG tablet Take 2 tablets (1,000 mg total) by mouth 4 (four) times daily. 01/01/17   Carlisle Cater, PA-C  naproxen (NAPROSYN) 500 MG tablet Take 1 tablet (500 mg total) by mouth 2 (two) times daily. 01/01/17   Carlisle Cater, PA-C  spironolactone (ALDACTONE) 25 MG tablet  09/13/16   Historical Provider, MD    Family History Family History  Problem Relation Age of Onset  . Hypertension Other   . Anemia Other   . Autoimmune disease Other     Social History Social History  Substance Use Topics  . Smoking status: Never Smoker  . Smokeless tobacco: Never Used  . Alcohol use No     Allergies   Amoxicillin; Lisinopril; and Morphine and related   Review of Systems Review of Systems  Constitutional: Negative for diaphoresis, fever and unexpected weight change.  HENT: Negative for rhinorrhea and sore throat.  Eyes: Negative for redness.  Respiratory: Negative for cough and shortness of breath.   Cardiovascular: Negative for chest pain, palpitations and leg swelling.  Gastrointestinal: Negative for abdominal pain, constipation, diarrhea, nausea and vomiting.       Negative for fecal incontinence.   Genitourinary: Negative for dysuria, flank pain, hematuria, pelvic pain, vaginal bleeding and vaginal discharge.       Negative for urinary incontinence or retention.  Musculoskeletal: Positive for back pain. Negative for myalgias and neck pain.  Skin: Negative for rash.  Neurological: Negative for syncope, weakness, light-headedness, numbness and headaches.       Denies saddle paresthesias.  Psychiatric/Behavioral: The patient is not nervous/anxious.       Physical Exam Updated Vital Signs BP 136/84 (BP Location: Left Arm)   Pulse 64   Temp 97.9 F (36.6 C) (Oral)   Resp 16   Ht 5\' 7"  (1.702 m)   Wt 79.4 kg   LMP 09/10/2012   SpO2 100%   BMI 27.41 kg/m   Physical Exam  Constitutional: She appears well-developed and well-nourished.  HENT:  Head: Normocephalic and atraumatic.  Eyes: Conjunctivae are normal. Right eye exhibits no discharge. Left eye exhibits no discharge.  Neck: Normal range of motion. Neck supple.  Cardiovascular: Normal rate, regular rhythm and normal heart sounds.   Pulmonary/Chest: Effort normal and breath sounds normal.  Abdominal: Soft. There is no tenderness. There is no CVA tenderness.  Musculoskeletal: Normal range of motion.       Cervical back: Normal.       Thoracic back: She exhibits tenderness and spasm. She exhibits normal range of motion and no bony tenderness.       Lumbar back: Normal.       Back:  No step-off noted with palpation of spine.   Neurological: She is alert. She has normal strength and normal reflexes. No sensory deficit.  5/5 strength in entire lower extremities bilaterally. No sensation deficit.   Skin: Skin is warm and dry. No rash noted.  Psychiatric: She has a normal mood and affect.  Nursing note and vitals reviewed.    ED Treatments / Results  Labs (all labs ordered are listed, but only abnormal results are displayed) [place], ED   ED ECG REPORT   Date: 01/01/2017  Rate: 55  Rhythm: sinus bradycardia  QRS Axis: normal  Intervals: normal  ST/T Wave abnormalities: nonspecific ST/T changes  Conduction Disutrbances:none  Narrative Interpretation:   Old EKG Reviewed: none available  I have personally reviewed the EKG tracing and agree with the computerized printout as noted.  Radiology Dg Chest 2 View  Result Date: 01/01/2017 CLINICAL DATA:  Hervey Ard RIGHT infrascapular pain. History of hypertension. EXAM: CHEST  2 VIEW COMPARISON:   Chest radiograph December 10, 2014 FINDINGS: Cardiomediastinal silhouette is normal. No pleural effusions or focal consolidations. Trachea projects midline and there is no pneumothorax. Soft tissue planes and included osseous structures are non-suspicious. IMPRESSION: Normal chest. Electronically Signed   By: Elon Alas M.D.   On: 01/01/2017 04:11    Procedures Procedures (including critical care time)  Medications Ordered in ED Medications - No data to display   Initial Impression / Assessment and Plan / ED Course  I have reviewed the triage vital signs and the nursing notes.  Pertinent labs & imaging results that were available during my care of the patient were reviewed by me and considered in my medical decision making (see chart for details).  Vital signs reviewed and are as follows: Vitals:   01/01/17 0204  BP: 136/84  Pulse: 64  Resp: 16  Temp: 97.9 F (36.6 C)   EKG reviewed. Consistent with patient history of T-wave inversions, however I do not have an old one for comparison. Patient reports having stress testing in the past which was negative.  Patient is concerned about her heart because she had a brother that died in his early 6s of heart attack. Testing performed tonight to help rule this out and reassured patient. We discussed likelihood of blood clot which is low given history and exam.   Patient husband updated on results. Will treat with muscle relaxer, heat, NSAIDs. Encouraged PCP follow-up if symptoms are not improving. Patient provided with a copy of her EKG.  Final Clinical Impressions(s) / ED Diagnoses   Final diagnoses:  Acute right-sided thoracic back pain   Patient with back pain. Patient has palpable spasm and tenderness below right scapula. I suspect her pain is musculoskeletal in nature. Given her concerns we evaluated her for cardiopulmonary etiology. EKG is unchanged the patient history, troponin is negative, chest x-ray is clear. Patient is  PERC negative. No neurological deficits. Patient is ambulatory. No warning symptoms of back pain including: fecal incontinence, urinary retention or overflow incontinence, night sweats, waking from sleep with back pain, unexplained fevers or weight loss, h/o cancer, IVDU, recent trauma. No concern for cauda equina, epidural abscess, or other serious cause of back pain. Conservative measures such as rest, ice/heat and pain medicine indicated with PCP follow-up if no improvement with conservative management.    New Prescriptions New Prescriptions   METHOCARBAMOL (ROBAXIN) 500 MG TABLET    Take 2 tablets (1,000 mg total) by mouth 4 (four) times daily.   NAPROXEN (NAPROSYN) 500 MG TABLET    Take 1 tablet (500 mg total) by mouth 2 (two) times daily.     Carlisle Cater, PA-C 01/01/17 [place], MD 01/01/17 [phone]

## 2017-05-25 ENCOUNTER — Encounter: Payer: Self-pay | Admitting: Family Medicine

## 2017-08-24 ENCOUNTER — Encounter (INDEPENDENT_AMBULATORY_CARE_PROVIDER_SITE_OTHER): Payer: BC Managed Care – PPO

## 2017-09-06 ENCOUNTER — Ambulatory Visit (INDEPENDENT_AMBULATORY_CARE_PROVIDER_SITE_OTHER): Payer: BC Managed Care – PPO | Admitting: Family Medicine

## 2017-09-06 ENCOUNTER — Encounter (INDEPENDENT_AMBULATORY_CARE_PROVIDER_SITE_OTHER): Payer: Self-pay | Admitting: Family Medicine

## 2017-09-06 VITALS — BP 116/73 | HR 60 | Temp 98.0°F | Ht 67.0 in | Wt 197.0 lb

## 2017-09-06 DIAGNOSIS — Z0289 Encounter for other administrative examinations: Secondary | ICD-10-CM

## 2017-09-06 DIAGNOSIS — Z1331 Encounter for screening for depression: Secondary | ICD-10-CM | POA: Diagnosis not present

## 2017-09-06 DIAGNOSIS — R5383 Other fatigue: Secondary | ICD-10-CM | POA: Diagnosis not present

## 2017-09-06 DIAGNOSIS — I10 Essential (primary) hypertension: Secondary | ICD-10-CM | POA: Diagnosis not present

## 2017-09-06 DIAGNOSIS — R0602 Shortness of breath: Secondary | ICD-10-CM

## 2017-09-06 DIAGNOSIS — E669 Obesity, unspecified: Secondary | ICD-10-CM

## 2017-09-06 DIAGNOSIS — Z6830 Body mass index (BMI) 30.0-30.9, adult: Secondary | ICD-10-CM | POA: Diagnosis not present

## 2017-09-06 DIAGNOSIS — E559 Vitamin D deficiency, unspecified: Secondary | ICD-10-CM

## 2017-09-06 NOTE — Progress Notes (Signed)
Office: [phone]  /  Fax: [phone]   HPI:   Chief Complaint: [place] (MR# [id number]) is a 47 y.o. female who presents on 09/06/2017 for obesity evaluation and treatment. Current BMI is Body mass index is 30.85 kg/m.Caitlin Stark has struggled with weight gain since having her kids, worse with early menopause and has been unsuccessful in either losing weight or maintaining long term weight loss. 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 Caitlin Stark attended our information session and states she is currently in the action stage of change and ready to dedicate time achieving and maintaining a healthier weight.  Caitlin Stark states her family eats meals together she thinks her family will eat healthier with  her her desired weight loss is 34 lbs she started gaining weight after having children her heaviest weight ever was 217 lbs. she has significant food cravings issues  she snacks frequently in the evenings she frequently eats larger portions than normal  she has binge eating behaviors she struggles with emotional eating    Fatigue Caitlin Stark feels her energy is lower than it should be. This has worsened with weight gain and has not worsened recently. Caitlin Stark admits to daytime somnolence and  denies waking up still tired. Patient is at risk for obstructive sleep apnea. Patent has a history of symptoms of daytime fatigue. Patient generally gets 8 hours of sleep per night, and states they generally have restful sleep. Snoring is not present. Apneic episodes are present. Epworth Sleepiness Score is 6  Dyspnea on exertion Caitlin Stark notes increasing shortness of breath with exercising and seems to be worsening over time with weight gain. She notes getting out of breath sooner with activity than she used to. This has not gotten worse recently. Caitlin Stark denies orthopnea.  Vitamin D deficiency Caitlin Stark has a diagnosis of vitamin D deficiency, no recent labs. She is not currently taking  vit D or multi vitamin. Caitlin Stark admits fatigue and denies nausea, vomiting or muscle weakness.  Hypertension Caitlin Stark is a 47 y.o. female with hypertension. Her blood pressure is stable on Diltiazem and Spironolactone. Caitlin Stark denies chest pain or shortness of breath on exertion. She is working weight loss to help control her blood pressure with the goal of decreasing her risk of heart attack and stroke. Caitlin Stark blood pressure is currently stable.  Depression Screen Caitlin Stark's Food and Mood (modified PHQ-9) score was  Depression screen PHQ 2/9 09/06/2017  Decreased Interest 1  Down, Depressed, Hopeless 2  PHQ - 2 Score 3  Altered sleeping 1  Tired, decreased energy 2  Change in appetite 0  Feeling bad or failure about yourself  2  Trouble concentrating 0  Moving slowly or fidgety/restless 0  Suicidal thoughts 0  PHQ-9 Score 8  Difficult doing work/chores Somewhat difficult    ALLERGIES: Allergies  Allergen Reactions  . Amoxicillin     REACTION: eyes swelling  . Lisinopril     REACTION: swelling of eyes and lower lip  . Morphine And Related Hives    MEDICATIONS: Current Outpatient Medications on File Prior to Visit  Medication Sig Dispense Refill  . diltiazem (DILACOR XR) 180 MG 24 hr capsule Take 180 mg by mouth daily.      Marland Kitchen spironolactone (ALDACTONE) 25 MG tablet      No current facility-administered medications on file prior to visit.     PAST MEDICAL HISTORY: Past Medical History:  Diagnosis Date  . Abnormal Pap smear 1993  . Anemia   .  Breast mass, right   . Fibroid   . Hirsutism   . Hypertension   . Menorrhagia   . Vitamin D deficiency     PAST SURGICAL HISTORY: Past Surgical History:  Procedure Laterality Date  . ABDOMINOPLASTY    . CESAREAN SECTION    . Diastasis Repair  2010  . HYSTEROSCOPY W/D&C    . LIPOSUCTION    . TUBAL LIGATION      SOCIAL HISTORY: Social History   Tobacco Use  . Smoking status: Never Smoker  .  Smokeless tobacco: Never Used  Substance Use Topics  . Alcohol use: No  . Drug use: No    FAMILY HISTORY: Family History  Problem Relation Age of Onset  . Hypertension Other   . Anemia Other   . Autoimmune disease Other   . Diabetes Mother   . Hypertension Mother   . Hyperlipidemia Mother   . Obesity Mother   . Diabetes Father   . Hyperlipidemia Father   . Hypertension Father   . Sudden death Father   . Obesity Father     ROS: Review of Systems  Constitutional: Positive for malaise/fatigue.  HENT: Positive for congestion (nasal stuffiness).   Eyes:       Wear Glasses or Contacts  Respiratory: Positive for shortness of breath (on exertion).   Cardiovascular: Negative for chest pain.  Gastrointestinal: Positive for constipation. Negative for nausea and vomiting.  Musculoskeletal:       Muscle or Joint Pain Negative muscle weakness  Neurological: Negative for headaches.    PHYSICAL EXAM: Blood pressure 116/73, pulse 60, temperature 98 F (36.7 C), temperature source Oral, height 5\' 7"  (1.702 m), weight 197 lb (89.4 kg), last menstrual period 09/10/2012, SpO2 98 %. Body mass index is 30.85 kg/m. Physical Exam  Constitutional: She is oriented to person, place, and time. She appears well-developed and well-nourished.  Cardiovascular: Normal rate.  Pulmonary/Chest: Effort normal.  Musculoskeletal: Normal range of motion.  Neurological: She is oriented to person, place, and time.  Skin: Skin is warm and dry.  Psychiatric: She has a normal mood and affect. Her behavior is normal.  Vitals reviewed.   RECENT LABS AND TESTS: BMET No results found for: NA, K, CL, CO2, GLUCOSE, BUN, CREATININE, CALCIUM, GFRNONAA, GFRAA No results found for: HGBA1C No results found for: INSULIN CBC    Component Value Date/Time   WBC 5.0 08/12/2011 1508   RBC 3.84 (L) 08/12/2011 1508   HGB 11.1 (L) 08/12/2011 1508   HCT 33.3 (L) 08/12/2011 1508   PLT 244 08/12/2011 1508   MCV 86.7  08/12/2011 1508   MCH 28.9 08/12/2011 1508   MCHC 33.3 08/12/2011 1508   RDW 14.0 08/12/2011 1508   LYMPHSABS 2.0 08/12/2011 1508   MONOABS 0.5 08/12/2011 1508   EOSABS 0.0 08/12/2011 1508   BASOSABS 0.1 08/12/2011 1508   Iron/TIBC/Ferritin/ %Sat No results found for: IRON, TIBC, FERRITIN, IRONPCTSAT Lipid Panel  No results found for: CHOL, TRIG, HDL, CHOLHDL, VLDL, LDLCALC, LDLDIRECT Hepatic Function Panel  No results found for: PROT, ALBUMIN, AST, ALT, ALKPHOS, BILITOT, BILIDIR, IBILI No results found for: TSH  ECG  shows NSR with a rate of 63 BPM INDIRECT CALORIMETER done today shows a VO2 of 160 and a REE of 1111. Her calculated basal metabolic rate is 6789 thus her basal metabolic rate is worse than expected.    ASSESSMENT AND PLAN: Other fatigue - Plan: EKG 12-Lead, Vitamin B12, Folate, Hemoglobin A1c, Insulin, random, Lipid Panel With LDL/HDL  Ratio, T3, T4, free, TSH, CBC With Differential  Shortness of breath on exertion  Essential hypertension - Plan: Comprehensive metabolic panel  Vitamin D deficiency - Plan: VITAMIN D 25 Hydroxy (Vit-D Deficiency, Fractures)  Depression screening  Class 1 obesity with serious comorbidity and body mass index (BMI) of 30.0 to 30.9 in adult, unspecified obesity type  PLAN:  Fatigue Caitlin Stark was informed that her fatigue may be related to obesity, depression or many other causes. Labs will be ordered, and in the meanwhile Ayse has agreed to work on diet, exercise and weight loss to help with fatigue. Proper sleep hygiene was discussed including the need for 7-8 hours of quality sleep each night. A sleep study was not ordered based on symptoms and Epworth score.  Dyspnea on exertion Caitlin Stark's shortness of breath appears to be obesity related and exercise induced. She has agreed to work on weight loss and gradually increase exercise to treat her exercise induced shortness of breath. If Tiyona follows our instructions and loses weight  without improvement of her shortness of breath, we will plan to refer to pulmonology. We will monitor this condition regularly. Caitlin Stark agrees to this plan.  Vitamin D Deficiency Caitlin Stark was informed that low vitamin D levels contributes to fatigue and are associated with obesity, breast, and colon cancer. We will check labs and will follow up for routine testing of vitamin D, at least 2-3 times per year. Caitlin Stark agrees to follow up with our clinic in 2 weeks.  Hypertension We discussed sodium restriction, working on healthy weight loss, and a regular exercise program as the means to achieve improved blood pressure control. Caitlin Stark agreed with this plan and agreed to follow up as directed. We will continue to monitor her blood pressure as well as her progress with the above lifestyle modifications. We will check labs and she will continue her medications as prescribed and will watch for signs of hypotension as she continues her lifestyle modifications.  Depression Screen Caitlin Stark had a mildly positive depression screening. Depression is commonly associated with obesity and often results in emotional eating behaviors. We will monitor this closely and work on CBT to help improve the non-hunger eating patterns. Referral to Psychology may be required if no improvement is seen as she continues in our clinic.  Obesity Caitlin Stark is currently in the action stage of change and her goal is to continue with weight loss efforts She has agreed to follow the Category 2 plan Caitlin Stark has been instructed to work up to a goal of 150 minutes of combined cardio and strengthening exercise per week for weight loss and overall health benefits. We discussed the following Behavioral Modification Strategies today: increasing lean protein intake, decreasing simple carbohydrates, work on meal planning and easy cooking plans and dealing with family or coworker sabotage  Caitlin Stark has agreed to follow up with our clinic in 2 weeks. She was  informed of the importance of frequent follow up visits to maximize her success with intensive lifestyle modifications for her multiple health conditions. She was informed we would discuss her lab results at her next visit unless there is a critical issue that needs to be addressed sooner. Caitlin Stark agreed to keep her next visit at the agreed upon time to discuss these results.  I, Caitlin Stark, am acting as transcriptionist for Dennard Nip, MD  I have reviewed the above documentation for accuracy and completeness, and I agree with the above. -Dennard Nip, MD    OBESITY BEHAVIORAL INTERVENTION VISIT  Today's visit  was # 1 out of 22.  Starting weight: 197 lbs Starting date: 09/06/17 Today's weight : 197 lbs  Today's date: 09/06/2017 Total lbs lost to date: 0 (Patients must lose 7 lbs in the first 6 months to continue with counseling)   ASK: We discussed the diagnosis of obesity with Cannon Alderman today and Janiesha agreed to give [place] permission to discuss obesity behavioral modification therapy today.  ASSESS: Jozelyn has the diagnosis of obesity and her BMI today is 30.85 [place] is in the action stage of change   ADVISE: Elveta was educated on the multiple health risks of obesity as well as the benefit of weight loss to improve her health. She was advised of the need for long term treatment and the importance of lifestyle modifications.  AGREE: Multiple dietary modification options and treatment options were discussed and  Taryne agreed to follow the Category 2 plan We discussed the following Behavioral Modification Strategies today: increasing lean protein intake, decreasing simple carbohydrates, work on meal planning and easy cooking plans and dealing with family or coworker sabotage

## 2017-09-07 LAB — COMPREHENSIVE METABOLIC PANEL
ALBUMIN: 4.6 g/dL (ref 3.5–5.5)
ALK PHOS: 110 IU/L (ref 39–117)
ALT: 39 IU/L — ABNORMAL HIGH (ref 0–32)
AST: 28 IU/L (ref 0–40)
Albumin/Globulin Ratio: 1.4 (ref 1.2–2.2)
BILIRUBIN TOTAL: 0.4 mg/dL (ref 0.0–1.2)
BUN / CREAT RATIO: 13 (ref 9–23)
BUN: 13 mg/dL (ref 6–24)
CO2: 22 mmol/L (ref 20–29)
CREATININE: 1.04 mg/dL — AB (ref 0.57–1.00)
Calcium: 9.9 mg/dL (ref 8.7–10.2)
Chloride: 99 mmol/L (ref 96–106)
GFR calc non Af Amer: 64 mL/min/{1.73_m2} (ref >59)
GFR, EST AFRICAN AMERICAN: 74 mL/min/{1.73_m2} (ref >59)
GLOBULIN, TOTAL: 3.3 g/dL (ref 1.5–4.5)
Glucose: 82 mg/dL (ref 65–99)
Potassium: 4.2 mmol/L (ref 3.5–5.2)
SODIUM: 140 mmol/L (ref 134–144)
TOTAL PROTEIN: 7.9 g/dL (ref 6.0–8.5)

## 2017-09-07 LAB — CBC WITH DIFFERENTIAL
BASOS: 1 %
Basophils Absolute: 0.1 10*3/uL (ref 0.0–0.2)
EOS (ABSOLUTE): 0.1 10*3/uL (ref 0.0–0.4)
Eos: 2 %
HEMATOCRIT: 40.3 % (ref 34.0–46.6)
HEMOGLOBIN: 13.6 g/dL (ref 11.1–15.9)
IMMATURE GRANS (ABS): 0 10*3/uL (ref 0.0–0.1)
IMMATURE GRANULOCYTES: 0 %
LYMPHS: 29 %
Lymphocytes Absolute: 2.4 10*3/uL (ref 0.7–3.1)
MCH: 29.6 pg (ref 26.6–33.0)
MCHC: 33.7 g/dL (ref 31.5–35.7)
MCV: 88 fL (ref 79–97)
MONOCYTES: 7 %
MONOS ABS: 0.6 10*3/uL (ref 0.1–0.9)
Neutrophils Absolute: 5 10*3/uL (ref 1.4–7.0)
Neutrophils: 61 %
RBC: 4.6 x10E6/uL (ref 3.77–5.28)
RDW: 14.9 % (ref 12.3–15.4)
WBC: 8.2 10*3/uL (ref 3.4–10.8)

## 2017-09-07 LAB — LIPID PANEL WITH LDL/HDL RATIO
CHOLESTEROL TOTAL: 171 mg/dL (ref 100–199)
HDL: 59 mg/dL (ref >39)
LDL Calculated: 98 mg/dL (ref 0–99)
LDl/HDL Ratio: 1.7 ratio (ref 0.0–3.2)
TRIGLYCERIDES: 69 mg/dL (ref 0–149)
VLDL CHOLESTEROL CAL: 14 mg/dL (ref 5–40)

## 2017-09-07 LAB — HEMOGLOBIN A1C
ESTIMATED AVERAGE GLUCOSE: 123 mg/dL
Hgb A1c MFr Bld: 5.9 % — ABNORMAL HIGH (ref 4.8–5.6)

## 2017-09-07 LAB — VITAMIN D 25 HYDROXY (VIT D DEFICIENCY, FRACTURES): VIT D 25 HYDROXY: 20.6 ng/mL — AB (ref 30.0–100.0)

## 2017-09-07 LAB — TSH: TSH: 2.19 u[IU]/mL (ref 0.450–4.500)

## 2017-09-07 LAB — INSULIN, RANDOM: INSULIN: 7 u[IU]/mL (ref 2.6–24.9)

## 2017-09-07 LAB — T3: T3 TOTAL: 131 ng/dL (ref 71–180)

## 2017-09-07 LAB — VITAMIN B12: VITAMIN B 12: 629 pg/mL (ref 232–1245)

## 2017-09-07 LAB — T4, FREE: Free T4: 1.08 ng/dL (ref 0.82–1.77)

## 2017-09-07 LAB — FOLATE: Folate: 13.1 ng/mL (ref >3.0)

## 2017-09-11 ENCOUNTER — Ambulatory Visit (INDEPENDENT_AMBULATORY_CARE_PROVIDER_SITE_OTHER): Payer: BC Managed Care – PPO | Admitting: Family Medicine

## 2017-09-19 ENCOUNTER — Ambulatory Visit (INDEPENDENT_AMBULATORY_CARE_PROVIDER_SITE_OTHER): Payer: BC Managed Care – PPO | Admitting: Family Medicine

## 2017-09-19 VITALS — BP 109/70 | HR 72 | Temp 98.5°F | Ht 67.0 in | Wt 193.0 lb

## 2017-09-19 DIAGNOSIS — R945 Abnormal results of liver function studies: Secondary | ICD-10-CM

## 2017-09-19 DIAGNOSIS — I10 Essential (primary) hypertension: Secondary | ICD-10-CM

## 2017-09-19 DIAGNOSIS — R7303 Prediabetes: Secondary | ICD-10-CM

## 2017-09-19 DIAGNOSIS — E559 Vitamin D deficiency, unspecified: Secondary | ICD-10-CM | POA: Diagnosis not present

## 2017-09-19 DIAGNOSIS — Z6833 Body mass index (BMI) 33.0-33.9, adult: Secondary | ICD-10-CM | POA: Insufficient documentation

## 2017-09-19 DIAGNOSIS — E669 Obesity, unspecified: Secondary | ICD-10-CM | POA: Insufficient documentation

## 2017-09-19 DIAGNOSIS — Z9189 Other specified personal risk factors, not elsewhere classified: Secondary | ICD-10-CM

## 2017-09-19 DIAGNOSIS — Z6830 Body mass index (BMI) 30.0-30.9, adult: Secondary | ICD-10-CM | POA: Diagnosis not present

## 2017-09-19 DIAGNOSIS — R7989 Other specified abnormal findings of blood chemistry: Secondary | ICD-10-CM | POA: Insufficient documentation

## 2017-09-19 MED ORDER — VITAMIN D (ERGOCALCIFEROL) 1.25 MG (50000 UNIT) PO CAPS: 50000.0000 [IU] | ORAL_CAPSULE | ORAL | 0 refills | Status: DC

## 2017-09-19 NOTE — Progress Notes (Signed)
Office: [phone]  /  Fax: [phone]   HPI:   Chief Complaint: OBESITY Caitlin Stark is here to discuss her progress with her obesity treatment plan. She is on the Category 2 plan and is following her eating plan approximately 90 % of the time. She states she is exercising 0 minutes 0 times per week. [name] did well with the Category 2 plan. She struggled with breakfast options. She noted polyphagia the 1st week but this improved the 2nd week.  Her weight is 193 lb (87.5 kg) today and has had a weight loss of 4 pounds over a period of 2 weeks since her last visit. She has lost 4 lbs since starting treatment with [place].  Vitamin D deficiency Caitlin Stark has a new diagnosis of vitamin D deficiency. She is not on Vit D currently. She notes fatigue and denies nausea, vomiting or muscle weakness.  At risk for osteopenia and osteoporosis Caitlin Stark is at higher risk of osteopenia and osteoporosis due to vitamin D deficiency.   Pre-Diabetes Caitlin Stark has a new diagnosis of pre-diabetes based on her elevated Hgb A1c at 5.9 and was informed this puts her at greater risk of developing diabetes. She is doing well on lower simple carbohydrate plan. She is not taking metformin currently and continues to work on diet and exercise to decrease risk of diabetes. She notes polyphagia and denies nausea or hypoglycemia.  Elevated LFT Caitlin Stark has a diagnosis of very mildly elevated ALT. She denies abdominal pain or jaundice and has never been told of any liver problems in the past. She is not on statin and she has a history of mod ETOH intake.  Hypertension Caitlin Stark is a 47 y.o. female with hypertension. Shary's blood pressure is well controlled on spironolactone and diltiazem. She denies chest pain or headaches but she is at risk of blood pressure dropping too low. She is working weight loss to help control her blood pressure with the goal of decreasing her risk of heart attack and stroke. Caitlin Stark's blood pressure is  currently controlled.  ALLERGIES: Allergies  Allergen Reactions  . Amoxicillin     REACTION: eyes swelling  . Lisinopril     REACTION: swelling of eyes and lower lip  . Morphine And Related Hives    MEDICATIONS: Current Outpatient Medications on File Prior to Visit  Medication Sig Dispense Refill  . diltiazem (DILACOR XR) 180 MG 24 hr capsule Take 180 mg by mouth daily.      Marland Kitchen spironolactone (ALDACTONE) 25 MG tablet 25 mg daily.      No current facility-administered medications on file prior to visit.     PAST MEDICAL HISTORY: Past Medical History:  Diagnosis Date  . Abnormal Pap smear 1993  . Anemia   . Breast mass, right   . Fibroid   . Hirsutism   . Hypertension   . Menorrhagia   . Vitamin D deficiency     PAST SURGICAL HISTORY: Past Surgical History:  Procedure Laterality Date  . ABDOMINOPLASTY    . CESAREAN SECTION    . Diastasis Repair  2010  . HYSTEROSCOPY W/D&C    . LIPOSUCTION    . TUBAL LIGATION      SOCIAL HISTORY: Social History   Tobacco Use  . Smoking status: Never Smoker  . Smokeless tobacco: Never Used  Substance Use Topics  . Alcohol use: No  . Drug use: No    FAMILY HISTORY: Family History  Problem Relation Age of Onset  . Hypertension Other   .  Anemia Other   . Autoimmune disease Other   . Diabetes Mother   . Hypertension Mother   . Hyperlipidemia Mother   . Obesity Mother   . Diabetes Father   . Hyperlipidemia Father   . Hypertension Father   . Sudden death Father   . Obesity Father     ROS: Review of Systems  Constitutional: Positive for malaise/fatigue and weight loss.  Cardiovascular: Negative for chest pain.  Gastrointestinal: Negative for nausea and vomiting.  Musculoskeletal:       Negative muscle weakness  Neurological: Negative for headaches.  Endo/Heme/Allergies:       Negative hypoglycemia Positive polyphagia    PHYSICAL EXAM: Blood pressure 109/70, pulse 72, temperature 98.5 F (36.9 C), temperature  source Oral, height 5\' 7"  (1.702 m), weight 193 lb (87.5 kg), last menstrual period 09/10/2012, SpO2 98 %. Body mass index is 30.23 kg/m. Physical Exam  Constitutional: She is oriented to person, place, and time. She appears well-developed and well-nourished.  Cardiovascular: Normal rate.  Pulmonary/Chest: Effort normal.  Musculoskeletal: Normal range of motion.  Neurological: She is oriented to person, place, and time.  Skin: Skin is warm and dry.  Psychiatric: She has a normal mood and affect. Her behavior is normal.  Vitals reviewed.   RECENT LABS AND TESTS: BMET    Component Value Date/Time   NA 140 09/06/2017 0939   K 4.2 09/06/2017 0939   CL 99 09/06/2017 0939   CO2 22 09/06/2017 0939   GLUCOSE 82 09/06/2017 0939   BUN 13 09/06/2017 0939   CREATININE 1.04 (H) 09/06/2017 0939   CALCIUM 9.9 09/06/2017 0939   GFRNONAA 64 09/06/2017 0939   GFRAA 74 09/06/2017 0939   Lab Results  Component Value Date   HGBA1C 5.9 (H) 09/06/2017   Lab Results  Component Value Date   INSULIN 7.0 09/06/2017   CBC    Component Value Date/Time   WBC 8.2 09/06/2017 0939   WBC 5.0 08/12/2011 1508   RBC 4.60 09/06/2017 0939   RBC 3.84 (L) 08/12/2011 1508   HGB 13.6 09/06/2017 0939   HCT 40.3 09/06/2017 0939   PLT 244 08/12/2011 1508   MCV 88 09/06/2017 0939   MCH 29.6 09/06/2017 0939   MCH 28.9 08/12/2011 1508   MCHC 33.7 09/06/2017 0939   MCHC 33.3 08/12/2011 1508   RDW 14.9 09/06/2017 0939   LYMPHSABS 2.4 09/06/2017 0939   MONOABS 0.5 08/12/2011 1508   EOSABS 0.1 09/06/2017 0939   BASOSABS 0.1 09/06/2017 0939   Iron/TIBC/Ferritin/ %Sat No results found for: IRON, TIBC, FERRITIN, IRONPCTSAT Lipid Panel     Component Value Date/Time   CHOL 171 09/06/2017 0939   TRIG 69 09/06/2017 0939   HDL 59 09/06/2017 0939   LDLCALC 98 09/06/2017 0939   Hepatic Function Panel     Component Value Date/Time   PROT 7.9 09/06/2017 0939   ALBUMIN 4.6 09/06/2017 0939   AST 28 09/06/2017  0939   ALT 39 (H) 09/06/2017 0939   ALKPHOS 110 09/06/2017 0939   BILITOT 0.4 09/06/2017 0939      Component Value Date/Time   TSH 2.190 09/06/2017 0939  Results for DAMEISHA, TSCHIDA (MRN [id number]) as of 09/19/2017 16:16  Ref. Range 09/06/2017 09:39  Vitamin D, 25-Hydroxy Latest Ref Range: 30.0 - 100.0 ng/mL 20.6 (L)    ASSESSMENT AND PLAN: Vitamin D deficiency - Plan: Vitamin D, Ergocalciferol, (DRISDOL) 50000 units CAPS capsule  Prediabetes  Elevated LFTs  Essential hypertension  At risk for osteoporosis  Class 1 obesity with serious comorbidity and body mass index (BMI) of 30.0 to 30.9 in adult, unspecified obesity type  PLAN:  Vitamin D Deficiency Summer was informed that low vitamin D levels contributes to fatigue and are associated with obesity, breast, and colon cancer. Caitlin Stark agrees to start prescription Vit D @50 ,000 IU every week #4 with no refills. She will follow up for routine testing of vitamin D, at least 2-3 times per year. She was informed of the risk of over-replacement of vitamin D and agrees to not increase her dose unless she discusses this with [place] first. We will recheck labs in 3 months and Caitlin Stark agrees to follow up with our clinic in 2 weeks.  At risk for osteopenia and osteoporosis Caitlin Stark is at risk for osteopenia and osteoporsis due to her vitamin D deficiency. She was encouraged to take her vitamin D and follow her higher calcium diet and increase strengthening exercise to help strengthen her bones and decrease her risk of osteopenia and osteoporosis.  Pre-Diabetes Caitlin Stark will continue to work on weight loss, diet, exercise, and decreasing simple carbohydrates in her diet to help decrease the risk of diabetes. We dicussed metformin including benefits and risks. She was informed that eating too many simple carbohydrates or too many calories at one sitting increases the likelihood of GI side effects. Caitlin Stark declined metformin for now and a prescription was  not written today. We will recheck labs in 3 months and Caitlin Stark agrees to follow up with our clinic in 2 weeks as directed to monitor her progress.  Elevated LFT We discussed the likely diagnosis of non alcoholic fatty liver disease today and how this condition is obesity related. Caitlin Stark was educated on her risk of developing NASH or even liver failure and the only proven treatment for NAFLD was weight loss. Caitlin Stark agrees to continue with her weight loss efforts with healthier diet and exercise as an essential part of her treatment plan. We will recheck labs in 3 months and Caitlin Stark agrees to follow up with our clinic in 2 weeks.  Hypertension We discussed sodium restriction, working on healthy weight loss, and a regular exercise program as the means to achieve improved blood pressure control. Caitlin Stark agreed with this plan and agreed to follow up as directed. We will continue to monitor her blood pressure as well as her progress with the above lifestyle modifications. Caitlin Stark agrees to decrease spironolactone to 25 mg q AM (no refill needed) and will watch for signs of hypotension as she continues her lifestyle modifications. Caitlin Stark agrees to follow up with our clinic in 2 weeks and we will recheck blood pressure at that time.  Obesity Caitlin Stark is currently in the action stage of change. As such, her goal is to continue with weight loss efforts She has agreed to follow the Category 2 plan with breakfast options Caitlin Stark has been instructed to work up to a goal of 150 minutes of combined cardio and strengthening exercise per week for weight loss and overall health benefits. We discussed the following Behavioral Modification Strategies today: decreasing simple carbohydrates, work on meal planning and easy cooking plans, and no skipping meals   Caitlin Stark has agreed to follow up with our clinic in 2 weeks. She was informed of the importance of frequent follow up visits to maximize her success with intensive  lifestyle modifications for her multiple health conditions.   OBESITY BEHAVIORAL INTERVENTION VISIT  Today's visit was # 2 out of 22.  Starting weight: 197 lbs Starting date:  09/06/17 Today's weight : 193 lbs  Today's date: 09/19/2017 Total lbs lost to date: 4 (Patients must lose 7 lbs in the first 6 months to continue with counseling)   ASK: We discussed the diagnosis of obesity with Caitlin Stark today and Caitlin Stark agreed to give [place] permission to discuss obesity behavioral modification therapy today.  ASSESS: Caitlin Stark has the diagnosis of obesity and her BMI today is 30.22 Caitlin Stark is in the action stage of change   ADVISE: Caitlin Stark was educated on the multiple health risks of obesity as well as the benefit of weight loss to improve her health. She was advised of the need for long term treatment and the importance of lifestyle modifications.  AGREE: Multiple dietary modification options and treatment options were discussed and  Caitlin Stark agreed to the above obesity treatment plan.  I, Trixie Dredge, am acting as transcriptionist for Dennard Nip, MD  I have reviewed the above documentation for accuracy and completeness, and I agree with the above. -Dennard Nip, MD

## 2017-10-02 ENCOUNTER — Ambulatory Visit (INDEPENDENT_AMBULATORY_CARE_PROVIDER_SITE_OTHER): Payer: BC Managed Care – PPO | Admitting: Physician Assistant

## 2017-10-02 VITALS — BP 129/78 | HR 58 | Temp 98.2°F | Ht 67.0 in | Wt 191.0 lb

## 2017-10-02 DIAGNOSIS — I10 Essential (primary) hypertension: Secondary | ICD-10-CM | POA: Diagnosis not present

## 2017-10-02 DIAGNOSIS — Z6830 Body mass index (BMI) 30.0-30.9, adult: Secondary | ICD-10-CM | POA: Diagnosis not present

## 2017-10-02 DIAGNOSIS — E669 Obesity, unspecified: Secondary | ICD-10-CM

## 2017-10-02 NOTE — Progress Notes (Signed)
Office: [phone]  /  Fax: [phone]   HPI:   Chief Complaint: OBESITY Caitlin Stark is here to discuss her progress with her obesity treatment plan. She is on the Category 2 plan and is following her eating plan approximately 100 % of the time. She states she is exercising 0 minutes 0 times per week. Caitlin Stark continues to do well with weight loss. She would like more variety with her her lunch meal. Her weight is 191 lb (86.6 kg) today and has had a weight loss of 2 pounds over a period of 2 weeks since her last visit. She has lost 6 lbs since starting treatment with [place].  Hypertension Caitlin Stark is a 47 y.o. female with hypertension. She is on Dilacor and Aldactone was decreased from 25 mg to 12.5 mg. Caitlin Stark denies chest pain or shortness of breath on exertion. She is working weight loss to help control her blood pressure with the goal of decreasing her risk of heart attack and stroke. Caitlin Stark blood pressure is currently stable.  ALLERGIES: Allergies  Allergen Reactions  . Amoxicillin     REACTION: eyes swelling  . Lisinopril     REACTION: swelling of eyes and lower lip  . Morphine And Related Hives    MEDICATIONS: Current Outpatient Medications on File Prior to Visit  Medication Sig Dispense Refill  . diltiazem (DILACOR XR) 180 MG 24 hr capsule Take 180 mg by mouth daily.      Caitlin Stark Kitchen spironolactone (ALDACTONE) 25 MG tablet 25 mg daily.     . Vitamin D, Ergocalciferol, (DRISDOL) 50000 units CAPS capsule Take 1 capsule (50,000 Units total) by mouth every 7 (seven) days. 4 capsule 0   No current facility-administered medications on file prior to visit.     PAST MEDICAL HISTORY: Past Medical History:  Diagnosis Date  . Abnormal Pap smear 1993  . Anemia   . Breast mass, right   . Fibroid   . Hirsutism   . Hypertension   . Menorrhagia   . Vitamin D deficiency     PAST SURGICAL HISTORY: Past Surgical History:  Procedure Laterality Date  . ABDOMINOPLASTY    .  CESAREAN SECTION    . Diastasis Repair  2010  . HYSTEROSCOPY W/D&C    . LIPOSUCTION    . TUBAL LIGATION      SOCIAL HISTORY: Social History   Tobacco Use  . Smoking status: Never Smoker  . Smokeless tobacco: Never Used  Substance Use Topics  . Alcohol use: No  . Drug use: No    FAMILY HISTORY: Family History  Problem Relation Age of Onset  . Hypertension Other   . Anemia Other   . Autoimmune disease Other   . Diabetes Mother   . Hypertension Mother   . Hyperlipidemia Mother   . Obesity Mother   . Diabetes Father   . Hyperlipidemia Father   . Hypertension Father   . Sudden death Father   . Obesity Father     ROS: Review of Systems  Constitutional: Positive for weight loss.  Respiratory: Negative for shortness of breath (on exertion).   Cardiovascular: Negative for chest pain.    PHYSICAL EXAM: Blood pressure 129/78, pulse (!) 58, temperature 98.2 F (36.8 C), temperature source Oral, height 5\' 7"  (1.702 m), weight 191 lb (86.6 kg), last menstrual period 09/10/2012, SpO2 98 %. Body mass index is 29.91 kg/m. Physical Exam  Constitutional: She is oriented to person, place, and time. She appears well-developed and well-nourished.  Cardiovascular: Bradycardia present.  Pulmonary/Chest: Effort normal.  Musculoskeletal: Normal range of motion.  Neurological: She is oriented to person, place, and time.  Skin: Skin is warm and dry.  Psychiatric: She has a normal mood and affect. Her behavior is normal.  Vitals reviewed.   RECENT LABS AND TESTS: BMET    Component Value Date/Time   NA 140 09/06/2017 0939   K 4.2 09/06/2017 0939   CL 99 09/06/2017 0939   CO2 22 09/06/2017 0939   GLUCOSE 82 09/06/2017 0939   BUN 13 09/06/2017 0939   CREATININE 1.04 (H) 09/06/2017 0939   CALCIUM 9.9 09/06/2017 0939   GFRNONAA 64 09/06/2017 0939   GFRAA 74 09/06/2017 0939   Lab Results  Component Value Date   HGBA1C 5.9 (H) 09/06/2017   Lab Results  Component Value Date     INSULIN 7.0 09/06/2017   CBC    Component Value Date/Time   WBC 8.2 09/06/2017 0939   WBC 5.0 08/12/2011 1508   RBC 4.60 09/06/2017 0939   RBC 3.84 (L) 08/12/2011 1508   HGB 13.6 09/06/2017 0939   HCT 40.3 09/06/2017 0939   PLT 244 08/12/2011 1508   MCV 88 09/06/2017 0939   MCH 29.6 09/06/2017 0939   MCH 28.9 08/12/2011 1508   MCHC 33.7 09/06/2017 0939   MCHC 33.3 08/12/2011 1508   RDW 14.9 09/06/2017 0939   LYMPHSABS 2.4 09/06/2017 0939   MONOABS 0.5 08/12/2011 1508   EOSABS 0.1 09/06/2017 0939   BASOSABS 0.1 09/06/2017 0939   Iron/TIBC/Ferritin/ %Sat No results found for: IRON, TIBC, FERRITIN, IRONPCTSAT Lipid Panel     Component Value Date/Time   CHOL 171 09/06/2017 0939   TRIG 69 09/06/2017 0939   HDL 59 09/06/2017 0939   LDLCALC 98 09/06/2017 0939   Hepatic Function Panel     Component Value Date/Time   PROT 7.9 09/06/2017 0939   ALBUMIN 4.6 09/06/2017 0939   AST 28 09/06/2017 0939   ALT 39 (H) 09/06/2017 0939   ALKPHOS 110 09/06/2017 0939   BILITOT 0.4 09/06/2017 0939      Component Value Date/Time   TSH 2.190 09/06/2017 0939    ASSESSMENT AND PLAN: Essential hypertension  Class 1 obesity with serious comorbidity and body mass index (BMI) of 30.0 to 30.9 in adult, unspecified obesity type  PLAN:  Hypertension We discussed sodium restriction, working on healthy weight loss, and a regular exercise program as the means to achieve improved blood pressure control. Caitlin Stark agreed with this plan and agreed to follow up as directed. We will continue to monitor her blood pressure as well as her progress with the above lifestyle modifications. She was advised to keep a blood pressure log at home and bring for review. Caitlin Stark will continue her medications as prescribed and will watch for signs of hypotension as she continues her lifestyle modifications.  We spent > than 50% of the 15 minute visit on the counseling as documented in the note.  Obesity Caitlin Stark is  currently in the action stage of change. As such, her goal is to continue with weight loss efforts She has agreed to keep a food journal with 400 to 500 calories and 35 grams of protein at lunch daily and follow the Category 2 plan Caitlin Stark has been instructed to work up to a goal of 150 minutes of combined cardio and strengthening exercise per week for weight loss and overall health benefits. We discussed the following Behavioral Modification Strategies today: increasing lean protein intake and  work on meal planning and easy cooking plans  Caitlin Stark has agreed to follow up with our clinic in 2 weeks. She was informed of the importance of frequent follow up visits to maximize her success with intensive lifestyle modifications for her multiple health conditions.   OBESITY BEHAVIORAL INTERVENTION VISIT  Today's visit was # 3 out of 22.  Starting weight: 197 lbs Starting date: 09/06/17 Today's weight : 191 lbs Today's date: 10/02/2017 Total lbs lost to date: 6 (Patients must lose 7 lbs in the first 6 months to continue with counseling)   ASK: We discussed the diagnosis of obesity with Caitlin Stark today and Caitlin Stark agreed to give [place] permission to discuss obesity behavioral modification therapy today.  ASSESS: Caitlin Stark has the diagnosis of obesity and her BMI today is 29.91 Caitlin Stark is in the action stage of change   ADVISE: Caitlin Stark was educated on the multiple health risks of obesity as well as the benefit of weight loss to improve her health. She was advised of the need for long term treatment and the importance of lifestyle modifications.  AGREE: Multiple dietary modification options and treatment options were discussed and  Caitlin Stark agreed to the above obesity treatment plan.   Corey Skains, am acting as transcriptionist for [employer], PA-C I, Lacy Duverney [facility], have reviewed this note and agree with its contents.

## 2017-10-16 ENCOUNTER — Ambulatory Visit (INDEPENDENT_AMBULATORY_CARE_PROVIDER_SITE_OTHER): Payer: BC Managed Care – PPO | Admitting: Physician Assistant

## 2017-10-24 ENCOUNTER — Ambulatory Visit (INDEPENDENT_AMBULATORY_CARE_PROVIDER_SITE_OTHER): Payer: BC Managed Care – PPO | Admitting: Physician Assistant

## 2017-10-26 ENCOUNTER — Ambulatory Visit (INDEPENDENT_AMBULATORY_CARE_PROVIDER_SITE_OTHER): Payer: BC Managed Care – PPO | Admitting: Physician Assistant

## 2017-10-26 VITALS — BP 121/76 | HR 55 | Temp 97.6°F | Ht 67.0 in | Wt 191.0 lb

## 2017-10-26 DIAGNOSIS — E669 Obesity, unspecified: Secondary | ICD-10-CM | POA: Diagnosis not present

## 2017-10-26 DIAGNOSIS — Z6830 Body mass index (BMI) 30.0-30.9, adult: Secondary | ICD-10-CM

## 2017-10-26 DIAGNOSIS — I10 Essential (primary) hypertension: Secondary | ICD-10-CM

## 2017-10-26 MED ORDER — SPIRONOLACTONE 25 MG PO TABS: 25.0000 mg | ORAL_TABLET | Freq: Two times a day (BID) | ORAL | Status: DC

## 2017-10-30 NOTE — Progress Notes (Signed)
Office: [phone]  /  Fax: [phone]   HPI:   Chief Complaint: OBESITY Caitlin Stark is here to discuss her progress with her obesity treatment plan. She is on the keep a food journal with 400 to 500 calories and 35 grams of protein at lunch daily and the Category 2 plan and is following her eating plan approximately 100 % of the time. She states she is exercising 0 minutes 0 times per week. Caitlin Stark maintained her weight. She states she is frustrated that she has not lost any weight. Caitlin Stark states she follows the structured meal plan, however, she often exceeds her allowed snack calories.  Her weight is 191 lb (86.6 kg) today and has maintained weight over a period of 3 weeks since her last visit. She has lost 6 lbs since starting treatment with [place].  Hypertension Caitlin Stark is a 47 y.o. female with hypertension. She has been back to taking her Spironolactone 25 mg twice daily. Caitlin Stark denies chest pain or shortness of breath on exertion. She is working weight loss to help control her blood pressure with the goal of decreasing her risk of heart attack and stroke. Caitlin Stark blood pressure is stable.  ALLERGIES: Allergies  Allergen Reactions  . Amoxicillin     REACTION: eyes swelling  . Lisinopril     REACTION: swelling of eyes and lower lip  . Morphine And Related Hives    MEDICATIONS: Current Outpatient Medications on File Prior to Visit  Medication Sig Dispense Refill  . diltiazem (DILACOR XR) 180 MG 24 hr capsule Take 180 mg by mouth daily.      . Vitamin D, Ergocalciferol, (DRISDOL) 50000 units CAPS capsule Take 1 capsule (50,000 Units total) by mouth every 7 (seven) days. 4 capsule 0   No current facility-administered medications on file prior to visit.     PAST MEDICAL HISTORY: Past Medical History:  Diagnosis Date  . Abnormal Pap smear 1993  . Anemia   . Breast mass, right   . Fibroid   . Hirsutism   . Hypertension   . Menorrhagia   . Vitamin D  deficiency     PAST SURGICAL HISTORY: Past Surgical History:  Procedure Laterality Date  . ABDOMINOPLASTY    . CESAREAN SECTION    . Diastasis Repair  2010  . HYSTEROSCOPY W/D&C    . LIPOSUCTION    . TUBAL LIGATION      SOCIAL HISTORY: Social History   Tobacco Use  . Smoking status: Never Smoker  . Smokeless tobacco: Never Used  Substance Use Topics  . Alcohol use: No  . Drug use: No    FAMILY HISTORY: Family History  Problem Relation Age of Onset  . Hypertension Other   . Anemia Other   . Autoimmune disease Other   . Diabetes Mother   . Hypertension Mother   . Hyperlipidemia Mother   . Obesity Mother   . Diabetes Father   . Hyperlipidemia Father   . Hypertension Father   . Sudden death Father   . Obesity Father     ROS: Review of Systems  Constitutional: Negative for weight loss.  Respiratory: Negative for shortness of breath (on exertion).   Cardiovascular: Negative for chest pain.    PHYSICAL EXAM: Blood pressure 121/76, pulse (!) 55, temperature 97.6 F (36.4 C), temperature source Oral, height 5\' 7"  (1.702 m), weight 191 lb (86.6 kg), last menstrual period 09/10/2012, SpO2 98 %. Body mass index is 29.91 kg/m. Physical Exam  Constitutional: She  is oriented to person, place, and time. She appears well-developed and well-nourished.  Cardiovascular: Bradycardia present.  Pulmonary/Chest: Effort normal.  Musculoskeletal: Normal range of motion.  Neurological: She is oriented to person, place, and time.  Skin: Skin is warm and dry.  Psychiatric: She has a normal mood and affect. Her behavior is normal.  Vitals reviewed.   RECENT LABS AND TESTS: BMET    Component Value Date/Time   NA 140 09/06/2017 0939   K 4.2 09/06/2017 0939   CL 99 09/06/2017 0939   CO2 22 09/06/2017 0939   GLUCOSE 82 09/06/2017 0939   BUN 13 09/06/2017 0939   CREATININE 1.04 (H) 09/06/2017 0939   CALCIUM 9.9 09/06/2017 0939   GFRNONAA 64 09/06/2017 0939   GFRAA 74  09/06/2017 0939   Lab Results  Component Value Date   HGBA1C 5.9 (H) 09/06/2017   Lab Results  Component Value Date   INSULIN 7.0 09/06/2017   CBC    Component Value Date/Time   WBC 8.2 09/06/2017 0939   WBC 5.0 08/12/2011 1508   RBC 4.60 09/06/2017 0939   RBC 3.84 (L) 08/12/2011 1508   HGB 13.6 09/06/2017 0939   HCT 40.3 09/06/2017 0939   PLT 244 08/12/2011 1508   MCV 88 09/06/2017 0939   MCH 29.6 09/06/2017 0939   MCH 28.9 08/12/2011 1508   MCHC 33.7 09/06/2017 0939   MCHC 33.3 08/12/2011 1508   RDW 14.9 09/06/2017 0939   LYMPHSABS 2.4 09/06/2017 0939   MONOABS 0.5 08/12/2011 1508   EOSABS 0.1 09/06/2017 0939   BASOSABS 0.1 09/06/2017 0939   Iron/TIBC/Ferritin/ %Sat No results found for: IRON, TIBC, FERRITIN, IRONPCTSAT Lipid Panel     Component Value Date/Time   CHOL 171 09/06/2017 0939   TRIG 69 09/06/2017 0939   HDL 59 09/06/2017 0939   LDLCALC 98 09/06/2017 0939   Hepatic Function Panel     Component Value Date/Time   PROT 7.9 09/06/2017 0939   ALBUMIN 4.6 09/06/2017 0939   AST 28 09/06/2017 0939   ALT 39 (H) 09/06/2017 0939   ALKPHOS 110 09/06/2017 0939   BILITOT 0.4 09/06/2017 0939      Component Value Date/Time   TSH 2.190 09/06/2017 0939    ASSESSMENT AND PLAN: Essential hypertension  Class 1 obesity with serious comorbidity and body mass index (BMI) of 30.0 to 30.9 in adult, unspecified obesity type  PLAN:  Hypertension We discussed sodium restriction, working on healthy weight loss, and a regular exercise program as the means to achieve improved blood pressure control. Kathrene agreed with this plan and agreed to follow up as directed. We will continue to monitor her blood pressure as well as her progress with the above lifestyle modifications. She will continue her medications as prescribed and will watch for signs of hypotension as she continues her lifestyle modifications.  We spent > than 50% of the 15 minute visit on the counseling as  documented in the note.  Obesity Caitlin Stark is currently in the action stage of change. As such, her goal is to continue with weight loss efforts She has agreed to keep a food journal with 400 to 500 calories and 35 grams of protein at lunch daily and follow the Category 2 plan Caitlin Stark has been instructed to work up to a goal of 150 minutes of combined cardio and strengthening exercise per week for weight loss and overall health benefits. We discussed the following Behavioral Modification Strategies today: increasing lean protein intake and better snacking choices  Caitlin Stark has agreed to follow up with our clinic in 2 weeks. She was informed of the importance of frequent follow up visits to maximize her success with intensive lifestyle modifications for her multiple health conditions.    OBESITY BEHAVIORAL INTERVENTION VISIT  Today's visit was # 4 out of 22.  Starting weight: 197 lbs Starting date: 09/06/17 Today's weight : 191 lbs Today's date: 10/26/2017 Total lbs lost to date: 6 (Patients must lose 7 lbs in the first 6 months to continue with counseling)   ASK: We discussed the diagnosis of obesity with Caitlin Stark today and Caitlin Stark agreed to give [place] permission to discuss obesity behavioral modification therapy today.  ASSESS: Caitlin Stark has the diagnosis of obesity and her BMI today is 29.91 Caitlin Stark is in the action stage of change   ADVISE: Caitlin Stark was educated on the multiple health risks of obesity as well as the benefit of weight loss to improve her health. She was advised of the need for long term treatment and the importance of lifestyle modifications.  AGREE: Multiple dietary modification options and treatment options were discussed and  Caitlin Stark agreed to the above obesity treatment plan.   Corey Skains, am acting as transcriptionist for [employer], PA-C I, Lacy Duverney [facility], have reviewed this note and agree with its content.

## 2017-11-09 ENCOUNTER — Ambulatory Visit (INDEPENDENT_AMBULATORY_CARE_PROVIDER_SITE_OTHER): Payer: BC Managed Care – PPO | Admitting: Family Medicine

## 2017-11-09 ENCOUNTER — Ambulatory Visit (INDEPENDENT_AMBULATORY_CARE_PROVIDER_SITE_OTHER): Payer: BC Managed Care – PPO | Admitting: Physician Assistant

## 2017-11-09 VITALS — BP 116/74 | HR 54 | Temp 97.8°F | Ht 67.0 in | Wt 187.0 lb

## 2017-11-09 DIAGNOSIS — E669 Obesity, unspecified: Secondary | ICD-10-CM | POA: Diagnosis not present

## 2017-11-09 DIAGNOSIS — E559 Vitamin D deficiency, unspecified: Secondary | ICD-10-CM | POA: Diagnosis not present

## 2017-11-09 DIAGNOSIS — Z6830 Body mass index (BMI) 30.0-30.9, adult: Secondary | ICD-10-CM | POA: Diagnosis not present

## 2017-11-09 NOTE — Progress Notes (Addendum)
Office: [phone]  /  Fax: [phone]   HPI:   Chief Complaint: OBESITY Caitlin Stark is here to discuss her progress with her obesity treatment plan. She is on the keep a food journal with 400-500 calories and 35 grams of protein at lunch daily and follow the Category 2 plan and is following her eating plan approximately 80 % of the time. She states she is exercising 0 minutes 0 times per week. Caitlin Stark continues to do well with weight loss. She has been sick with stomach virus and has skipped some meals. She is now better and back on track with her eating.  Her weight is 187 lb (84.8 kg) today and has had a weight loss of 4 pounds over a period of 2 weeks since her last visit. She has lost 10 lbs since starting treatment with [place].  Vitamin D Deficiency Caitlin Stark has a diagnosis of vitamin D deficiency. She is currently taking prescription Vit D and denies nausea, vomiting or muscle weakness.  ALLERGIES: Allergies  Allergen Reactions  . Amoxicillin     REACTION: eyes swelling  . Lisinopril     REACTION: swelling of eyes and lower lip  . Morphine And Related Hives    MEDICATIONS: Current Outpatient Medications on File Prior to Visit  Medication Sig Dispense Refill  . diltiazem (DILACOR XR) 180 MG 24 hr capsule Take 180 mg by mouth daily.      Marland Kitchen spironolactone (ALDACTONE) 25 MG tablet Take 1 tablet (25 mg total) by mouth 2 (two) times daily.    . Vitamin D, Ergocalciferol, (DRISDOL) 50000 units CAPS capsule Take 1 capsule (50,000 Units total) by mouth every 7 (seven) days. 4 capsule 0   No current facility-administered medications on file prior to visit.     PAST MEDICAL HISTORY: Past Medical History:  Diagnosis Date  . Abnormal Pap smear 1993  . Anemia   . Breast mass, right   . Fibroid   . Hirsutism   . Hypertension   . Menorrhagia   . Vitamin D deficiency     PAST SURGICAL HISTORY: Past Surgical History:  Procedure Laterality Date  . ABDOMINOPLASTY    . CESAREAN  SECTION    . Diastasis Repair  2010  . HYSTEROSCOPY W/D&C    . LIPOSUCTION    . TUBAL LIGATION      SOCIAL HISTORY: Social History   Tobacco Use  . Smoking status: Never Smoker  . Smokeless tobacco: Never Used  Substance Use Topics  . Alcohol use: No  . Drug use: No    FAMILY HISTORY: Family History  Problem Relation Age of Onset  . Hypertension Other   . Anemia Other   . Autoimmune disease Other   . Diabetes Mother   . Hypertension Mother   . Hyperlipidemia Mother   . Obesity Mother   . Diabetes Father   . Hyperlipidemia Father   . Hypertension Father   . Sudden death Father   . Obesity Father     ROS: Review of Systems  Constitutional: Positive for weight loss.  Gastrointestinal: Negative for nausea and vomiting.  Musculoskeletal:       Negative muscle weakness    PHYSICAL EXAM: Blood pressure 116/74, pulse (!) 54, temperature 97.8 F (36.6 C), temperature source Oral, height 5\' 7"  (1.702 m), weight 187 lb (84.8 kg), last menstrual period 09/10/2012, SpO2 98 %. Body mass index is 29.29 kg/m. Physical Exam  Constitutional: She is oriented to person, place, and time. She appears well-developed  and well-nourished.  Cardiovascular: Bradycardia present.  Pulmonary/Chest: Effort normal.  Musculoskeletal: Normal range of motion.  Neurological: She is oriented to person, place, and time.  Skin: Skin is warm and dry.  Psychiatric: She has a normal mood and affect. Her behavior is normal.  Vitals reviewed.   RECENT LABS AND TESTS: BMET    Component Value Date/Time   NA 140 09/06/2017 0939   K 4.2 09/06/2017 0939   CL 99 09/06/2017 0939   CO2 22 09/06/2017 0939   GLUCOSE 82 09/06/2017 0939   BUN 13 09/06/2017 0939   CREATININE 1.04 (H) 09/06/2017 0939   CALCIUM 9.9 09/06/2017 0939   GFRNONAA 64 09/06/2017 0939   GFRAA 74 09/06/2017 0939   Lab Results  Component Value Date   HGBA1C 5.9 (H) 09/06/2017   Lab Results  Component Value Date   INSULIN  7.0 09/06/2017   CBC    Component Value Date/Time   WBC 8.2 09/06/2017 0939   WBC 5.0 08/12/2011 1508   RBC 4.60 09/06/2017 0939   RBC 3.84 (L) 08/12/2011 1508   HGB 13.6 09/06/2017 0939   HCT 40.3 09/06/2017 0939   PLT 244 08/12/2011 1508   MCV 88 09/06/2017 0939   MCH 29.6 09/06/2017 0939   MCH 28.9 08/12/2011 1508   MCHC 33.7 09/06/2017 0939   MCHC 33.3 08/12/2011 1508   RDW 14.9 09/06/2017 0939   LYMPHSABS 2.4 09/06/2017 0939   MONOABS 0.5 08/12/2011 1508   EOSABS 0.1 09/06/2017 0939   BASOSABS 0.1 09/06/2017 0939   Iron/TIBC/Ferritin/ %Sat No results found for: IRON, TIBC, FERRITIN, IRONPCTSAT Lipid Panel     Component Value Date/Time   CHOL 171 09/06/2017 0939   TRIG 69 09/06/2017 0939   HDL 59 09/06/2017 0939   LDLCALC 98 09/06/2017 0939   Hepatic Function Panel     Component Value Date/Time   PROT 7.9 09/06/2017 0939   ALBUMIN 4.6 09/06/2017 0939   AST 28 09/06/2017 0939   ALT 39 (H) 09/06/2017 0939   ALKPHOS 110 09/06/2017 0939   BILITOT 0.4 09/06/2017 0939      Component Value Date/Time   TSH 2.190 09/06/2017 0939  Results for JAHLEAH, MARISCAL (MRN [id number]) as of 11/09/2017 16:32  Ref. Range 09/06/2017 09:39  Vitamin D, 25-Hydroxy Latest Ref Range: 30.0 - 100.0 ng/mL 20.6 (L)    ASSESSMENT AND PLAN: Vitamin D deficiency  Class 1 obesity with serious comorbidity and body mass index (BMI) of 30.0 to 30.9 in adult, unspecified obesity type - Starting BMI greater then 30  PLAN:  Vitamin D Deficiency Caitlin Stark was informed that low vitamin D levels contributes to fatigue and are associated with obesity, breast, and colon cancer. Caitlin Stark agrees to continue taking prescription Vit D @50 ,000 IU every week #4 and will follow up for routine testing of vitamin D, at least 2-3 times per year. She was informed of the risk of over-replacement of vitamin D and agrees to not increase her dose unless she discusses this with [place] first. Caitlin Stark agrees to follow up with our  clinic in  2 weeks.  We spent > than 50% of the 15 minute visit on the counseling as documented in the note.  Obesity Caitlin Stark is currently in the action stage of change. As such, her goal is to continue with weight loss efforts She has agreed to keep a food journal with 400-500 calories and 35 grams of protein at lunch daily and follow the Category 2 plan Caitlin Stark has been instructed to  work up to a goal of 150 minutes of combined cardio and strengthening exercise per week for weight loss and overall health benefits. We discussed the following Behavioral Modification Strategies today: increasing lean protein intake and no skipping meals   Caitlin Stark has agreed to follow up with our clinic in 2 weeks. She was informed of the importance of frequent follow up visits to maximize her success with intensive lifestyle modifications for her multiple health conditions.   OBESITY BEHAVIORAL INTERVENTION VISIT  Today's visit was # 5 out of 22.  Starting weight: 197 lbs Starting date: 09/06/17 Today's weight : 187 lbs  Today's date: 11/09/2017 Total lbs lost to date: 10 (Patients must lose 7 lbs in the first 6 months to continue with counseling)   ASK: We discussed the diagnosis of obesity with Caitlin Stark today and Caitlin Stark agreed to give [place] permission to discuss obesity behavioral modification therapy today.  ASSESS: Caitlin Stark has the diagnosis of obesity and her BMI today is 29.28 Caitlin Stark is in the action stage of change   ADVISE: Caitlin Stark was educated on the multiple health risks of obesity as well as the benefit of weight loss to improve her health. She was advised of the need for long term treatment and the importance of lifestyle modifications.  AGREE: Multiple dietary modification options and treatment options were discussed and  Caitlin Stark agreed to the above obesity treatment plan.   Caitlin Stark, am acting as transcriptionist for Lacy Duverney, PA-C I, Lacy Duverney [facility], have reviewed this  note and agree with its content.

## 2017-11-27 ENCOUNTER — Ambulatory Visit (INDEPENDENT_AMBULATORY_CARE_PROVIDER_SITE_OTHER): Payer: BC Managed Care – PPO | Admitting: Physician Assistant

## 2017-11-27 VITALS — BP 122/72 | HR 60 | Temp 98.0°F | Ht 67.0 in | Wt 186.0 lb

## 2017-11-27 DIAGNOSIS — R7303 Prediabetes: Secondary | ICD-10-CM | POA: Diagnosis not present

## 2017-11-27 DIAGNOSIS — E669 Obesity, unspecified: Secondary | ICD-10-CM

## 2017-11-27 DIAGNOSIS — Z6830 Body mass index (BMI) 30.0-30.9, adult: Secondary | ICD-10-CM | POA: Diagnosis not present

## 2017-11-27 NOTE — Progress Notes (Signed)
Office: [phone]  /  Fax: [phone]   HPI:   Chief Complaint: OBESITY Caitlin Stark is here to discuss her progress with her obesity treatment plan. She is on the keep a food journal with 400 to 500 calories and 35 grams of protein at lunch and the Category 2 plan and is following her eating plan approximately 90 % of the time. She states she is walking 20 minutes 7 times per week. Caitlin Stark continues to do well with weight loss. She has had increased cravings and she snacked more. Her weight is 186 lb (84.4 kg) today and has had a weight loss of 1 pound over a period of 3 to 4 weeks since her last visit. She has lost 11 lbs since starting treatment with [place].  Pre-Diabetes Caitlin Stark has a diagnosis of pre-diabetes based on her elevated Hgb A1c and was informed this puts her at greater risk of developing diabetes. She is not taking metformin currently and continues to work on diet and exercise to decrease risk of diabetes. She admits polyphagia and denies nausea or hypoglycemia.  ALLERGIES: Allergies  Allergen Reactions  . Amoxicillin     REACTION: eyes swelling  . Lisinopril     REACTION: swelling of eyes and lower lip  . Morphine And Related Hives    MEDICATIONS: Current Outpatient Medications on File Prior to Visit  Medication Sig Dispense Refill  . diltiazem (DILACOR XR) 180 MG 24 hr capsule Take 180 mg by mouth daily.      Marland Kitchen spironolactone (ALDACTONE) 25 MG tablet Take 1 tablet (25 mg total) by mouth 2 (two) times daily.    . Vitamin D, Ergocalciferol, (DRISDOL) 50000 units CAPS capsule Take 1 capsule (50,000 Units total) by mouth every 7 (seven) days. 4 capsule 0   No current facility-administered medications on file prior to visit.     PAST MEDICAL HISTORY: Past Medical History:  Diagnosis Date  . Abnormal Pap smear 1993  . Anemia   . Breast mass, right   . Fibroid   . Hirsutism   . Hypertension   . Menorrhagia   . Vitamin D deficiency     PAST SURGICAL HISTORY: Past  Surgical History:  Procedure Laterality Date  . ABDOMINOPLASTY    . CESAREAN SECTION    . Diastasis Repair  2010  . HYSTEROSCOPY W/D&C    . LIPOSUCTION    . TUBAL LIGATION      SOCIAL HISTORY: Social History   Tobacco Use  . Smoking status: Never Smoker  . Smokeless tobacco: Never Used  Substance Use Topics  . Alcohol use: No  . Drug use: No    FAMILY HISTORY: Family History  Problem Relation Age of Onset  . Hypertension Other   . Anemia Other   . Autoimmune disease Other   . Diabetes Mother   . Hypertension Mother   . Hyperlipidemia Mother   . Obesity Mother   . Diabetes Father   . Hyperlipidemia Father   . Hypertension Father   . Sudden death Father   . Obesity Father     ROS: Review of Systems  Constitutional: Positive for weight loss.  Gastrointestinal: Negative for nausea.  Endo/Heme/Allergies:       Postive for polyphagia  Negative for hypoglycemia    PHYSICAL EXAM: Blood pressure 122/72, pulse 60, temperature 98 F (36.7 C), temperature source Oral, height 5\' 7"  (1.702 m), weight 186 lb (84.4 kg), last menstrual period 09/10/2012, SpO2 98 %. Body mass index is 29.13 kg/m.  Physical Exam  Constitutional: She is oriented to person, place, and time. She appears well-developed and well-nourished.  Cardiovascular: Normal rate.  Pulmonary/Chest: Effort normal.  Musculoskeletal: Normal range of motion.  Neurological: She is oriented to person, place, and time.  Skin: Skin is warm and dry.  Psychiatric: She has a normal mood and affect. Her behavior is normal.  Vitals reviewed.   RECENT LABS AND TESTS: BMET    Component Value Date/Time   NA 140 09/06/2017 0939   K 4.2 09/06/2017 0939   CL 99 09/06/2017 0939   CO2 22 09/06/2017 0939   GLUCOSE 82 09/06/2017 0939   BUN 13 09/06/2017 0939   CREATININE 1.04 (H) 09/06/2017 0939   CALCIUM 9.9 09/06/2017 0939   GFRNONAA 64 09/06/2017 0939   GFRAA 74 09/06/2017 0939   Lab Results  Component Value  Date   HGBA1C 5.9 (H) 09/06/2017   Lab Results  Component Value Date   INSULIN 7.0 09/06/2017   CBC    Component Value Date/Time   WBC 8.2 09/06/2017 0939   WBC 5.0 08/12/2011 1508   RBC 4.60 09/06/2017 0939   RBC 3.84 (L) 08/12/2011 1508   HGB 13.6 09/06/2017 0939   HCT 40.3 09/06/2017 0939   PLT 244 08/12/2011 1508   MCV 88 09/06/2017 0939   MCH 29.6 09/06/2017 0939   MCH 28.9 08/12/2011 1508   MCHC 33.7 09/06/2017 0939   MCHC 33.3 08/12/2011 1508   RDW 14.9 09/06/2017 0939   LYMPHSABS 2.4 09/06/2017 0939   MONOABS 0.5 08/12/2011 1508   EOSABS 0.1 09/06/2017 0939   BASOSABS 0.1 09/06/2017 0939   Iron/TIBC/Ferritin/ %Sat No results found for: IRON, TIBC, FERRITIN, IRONPCTSAT Lipid Panel     Component Value Date/Time   CHOL 171 09/06/2017 0939   TRIG 69 09/06/2017 0939   HDL 59 09/06/2017 0939   LDLCALC 98 09/06/2017 0939   Hepatic Function Panel     Component Value Date/Time   PROT 7.9 09/06/2017 0939   ALBUMIN 4.6 09/06/2017 0939   AST 28 09/06/2017 0939   ALT 39 (H) 09/06/2017 0939   ALKPHOS 110 09/06/2017 0939   BILITOT 0.4 09/06/2017 0939      Component Value Date/Time   TSH 2.190 09/06/2017 0939   Results for [name], [name] (MRN [id number]) as of 11/27/2017 17:14  Ref. Range 09/06/2017 09:39  Vitamin D, 25-Hydroxy Latest Ref Range: 30.0 - 100.0 ng/mL 20.6 (L)   ASSESSMENT AND PLAN: Prediabetes  Class 1 obesity with serious comorbidity and body mass index (BMI) of 30.0 to 30.9 in adult, unspecified obesity type - Starting BMI greater then 30  PLAN:  Pre-Diabetes Caitlin Stark will continue to work on weight loss, exercise, and decreasing simple carbohydrates in her diet to help decrease the risk of diabetes. We dicussed metformin including benefits and risks. She was informed that eating too many simple carbohydrates or too many calories at one sitting increases the likelihood of GI side effects. Caitlin Stark declined metformin today and a prescription was not  written today. Caitlin Stark agreed to follow up with [place] as directed to monitor her progress.  We spent > than 50% of the 15 minute visit on the counseling as documented in the note.  Obesity Caitlin Stark is currently in the action stage of change. As such, her goal is to continue with weight loss efforts She has agreed to keep a food journal with 300 grams of protein at breakfast daily and follow the category 2 plan Caitlin Stark has been instructed to work  up to a goal of 150 minutes of combined cardio and strengthening exercise per week for weight loss and overall health benefits. We discussed the following Behavioral Modification Strategies today: increasing lean protein intake and better snacking choices  Caitlin Stark has agreed to follow up with our clinic in 2 weeks. She was informed of the importance of frequent follow up visits to maximize her success with intensive lifestyle modifications for her multiple health conditions.   OBESITY BEHAVIORAL INTERVENTION VISIT  Today's visit was # 6 out of 22.  Starting weight: 197 lbs Starting date: 09/06/17 Today's weight : 186 lbs Today's date: 11/27/2017 Total lbs lost to date: 11 (Patients must lose 7 lbs in the first 6 months to continue with counseling)   ASK: We discussed the diagnosis of obesity with Caitlin Stark today and Caitlin Stark agreed to give [place] permission to discuss obesity behavioral modification therapy today.  ASSESS: Caitlin Stark has the diagnosis of obesity and her BMI today is 29.12 Caitlin Stark is in the action stage of change   ADVISE: Caitlin Stark was educated on the multiple health risks of obesity as well as the benefit of weight loss to improve her health. She was advised of the need for long term treatment and the importance of lifestyle modifications.  AGREE: Multiple dietary modification options and treatment options were discussed and  Caitlin Stark agreed to the above obesity treatment plan.   Corey Skains, am acting as transcriptionist for [employer], PA-C I, Lacy Duverney [facility], have reviewed this note and agree with its content

## 2017-12-14 ENCOUNTER — Ambulatory Visit (INDEPENDENT_AMBULATORY_CARE_PROVIDER_SITE_OTHER): Payer: BC Managed Care – PPO | Admitting: Family Medicine

## 2018-01-02 ENCOUNTER — Ambulatory Visit (INDEPENDENT_AMBULATORY_CARE_PROVIDER_SITE_OTHER): Payer: BC Managed Care – PPO | Admitting: Family Medicine

## 2018-01-02 VITALS — BP 117/73 | HR 59 | Temp 97.8°F | Ht 67.0 in | Wt 185.0 lb

## 2018-01-02 DIAGNOSIS — E669 Obesity, unspecified: Secondary | ICD-10-CM | POA: Diagnosis not present

## 2018-01-02 DIAGNOSIS — E559 Vitamin D deficiency, unspecified: Secondary | ICD-10-CM | POA: Diagnosis not present

## 2018-01-02 DIAGNOSIS — R7303 Prediabetes: Secondary | ICD-10-CM

## 2018-01-02 DIAGNOSIS — Z9189 Other specified personal risk factors, not elsewhere classified: Secondary | ICD-10-CM

## 2018-01-02 DIAGNOSIS — Z6830 Body mass index (BMI) 30.0-30.9, adult: Secondary | ICD-10-CM

## 2018-01-02 MED ORDER — METFORMIN HCL 500 MG PO TABS: 500.0000 mg | ORAL_TABLET | Freq: Every day | ORAL | 0 refills | Status: DC

## 2018-01-02 MED ORDER — VITAMIN D (ERGOCALCIFEROL) 1.25 MG (50000 UNIT) PO CAPS: 50000.0000 [IU] | ORAL_CAPSULE | ORAL | 0 refills | Status: DC

## 2018-01-02 NOTE — Progress Notes (Signed)
Office: [phone]  /  Fax: [phone]   HPI:   Chief Complaint: OBESITY Caitlin Stark is here to discuss her progress with her obesity treatment plan. She is on the Category 2 plan and is following her eating plan approximately 80 % of the time. She states she is walking 10,000 to 15,000 steps a day for exercise. Caitlin Stark has been snacking on carb rich foods and is over snacking. She is eating significant amounts of snacks and feels frustrated with her weight loss. Her weight is 185 lb (83.9 kg) today and has had a weight loss of 1 pound over a period of 5 weeks since her last visit. She has lost 12 lbs since starting treatment with [place].  Vitamin D deficiency Caitlin Stark has a diagnosis of vitamin D deficiency. She is currently taking vit D and denies nausea, vomiting or muscle weakness.  Pre-Diabetes Caitlin Stark has a diagnosis of pre-diabetes based on her elevated Hgb A1c and was informed this puts her at greater risk of developing diabetes. She is snacking on carbohydrate rich foods. Caitlin Stark had discussed starting metformin with [facility]. Caitlin Stark continues to work on diet and exercise to decrease risk of diabetes. She denies nausea or hypoglycemia.  At risk for diabetes Caitlin Stark is at higher than average risk for developing diabetes due to her obesity and pre-diabetes. She currently denies polyuria or polydipsia.  ALLERGIES: Allergies  Allergen Reactions  . Amoxicillin     REACTION: eyes swelling  . Lisinopril     REACTION: swelling of eyes and lower lip  . Morphine And Related Hives    MEDICATIONS: Current Outpatient Medications on File Prior to Visit  Medication Sig Dispense Refill  . diltiazem (DILACOR XR) 180 MG 24 hr capsule Take 180 mg by mouth daily.      Marland Kitchen spironolactone (ALDACTONE) 25 MG tablet Take 1 tablet (25 mg total) by mouth 2 (two) times daily.    . Vitamin D, Ergocalciferol, (DRISDOL) 50000 units CAPS capsule Take 1 capsule (50,000 Units total) by mouth every 7 (seven) days. 4  capsule 0   No current facility-administered medications on file prior to visit.     PAST MEDICAL HISTORY: Past Medical History:  Diagnosis Date  . Abnormal Pap smear 1993  . Anemia   . Breast mass, right   . Fibroid   . Hirsutism   . Hypertension   . Menorrhagia   . Vitamin D deficiency     PAST SURGICAL HISTORY: Past Surgical History:  Procedure Laterality Date  . ABDOMINOPLASTY    . CESAREAN SECTION    . Diastasis Repair  2010  . HYSTEROSCOPY W/D&C    . LIPOSUCTION    . TUBAL LIGATION      SOCIAL HISTORY: Social History   Tobacco Use  . Smoking status: Never Smoker  . Smokeless tobacco: Never Used  Substance Use Topics  . Alcohol use: No  . Drug use: No    FAMILY HISTORY: Family History  Problem Relation Age of Onset  . Hypertension Other   . Anemia Other   . Autoimmune disease Other   . Diabetes Mother   . Hypertension Mother   . Hyperlipidemia Mother   . Obesity Mother   . Diabetes Father   . Hyperlipidemia Father   . Hypertension Father   . Sudden death Father   . Obesity Father     ROS: Review of Systems  Constitutional: Positive for weight loss.  Gastrointestinal: Negative for nausea and vomiting.  Genitourinary: Negative for frequency.  Musculoskeletal:       Negative for muscle weakness  Endo/Heme/Allergies: Negative for polydipsia.    PHYSICAL EXAM: Blood pressure 117/73, pulse (!) 59, temperature 97.8 F (36.6 C), temperature source Oral, height 5\' 7"  (1.702 m), weight 185 lb (83.9 kg), last menstrual period 09/10/2012, SpO2 99 %. Body mass index is 28.98 kg/m. Physical Exam  Constitutional: She is oriented to person, place, and time. She appears well-developed and well-nourished.  Cardiovascular: Normal rate.  Pulmonary/Chest: Effort normal.  Musculoskeletal: Normal range of motion.  Neurological: She is oriented to person, place, and time.  Skin: Skin is warm and dry.  Psychiatric: She has a normal mood and affect. Her  behavior is normal.  Vitals reviewed.   RECENT LABS AND TESTS: BMET    Component Value Date/Time   NA 140 09/06/2017 0939   K 4.2 09/06/2017 0939   CL 99 09/06/2017 0939   CO2 22 09/06/2017 0939   GLUCOSE 82 09/06/2017 0939   BUN 13 09/06/2017 0939   CREATININE 1.04 (H) 09/06/2017 0939   CALCIUM 9.9 09/06/2017 0939   GFRNONAA 64 09/06/2017 0939   GFRAA 74 09/06/2017 0939   Lab Results  Component Value Date   HGBA1C 5.9 (H) 09/06/2017   Lab Results  Component Value Date   INSULIN 7.0 09/06/2017   CBC    Component Value Date/Time   WBC 8.2 09/06/2017 0939   WBC 5.0 08/12/2011 1508   RBC 4.60 09/06/2017 0939   RBC 3.84 (L) 08/12/2011 1508   HGB 13.6 09/06/2017 0939   HCT 40.3 09/06/2017 0939   PLT 244 08/12/2011 1508   MCV 88 09/06/2017 0939   MCH 29.6 09/06/2017 0939   MCH 28.9 08/12/2011 1508   MCHC 33.7 09/06/2017 0939   MCHC 33.3 08/12/2011 1508   RDW 14.9 09/06/2017 0939   LYMPHSABS 2.4 09/06/2017 0939   MONOABS 0.5 08/12/2011 1508   EOSABS 0.1 09/06/2017 0939   BASOSABS 0.1 09/06/2017 0939   Iron/TIBC/Ferritin/ %Sat No results found for: IRON, TIBC, FERRITIN, IRONPCTSAT Lipid Panel     Component Value Date/Time   CHOL 171 09/06/2017 0939   TRIG 69 09/06/2017 0939   HDL 59 09/06/2017 0939   LDLCALC 98 09/06/2017 0939   Hepatic Function Panel     Component Value Date/Time   PROT 7.9 09/06/2017 0939   ALBUMIN 4.6 09/06/2017 0939   AST 28 09/06/2017 0939   ALT 39 (H) 09/06/2017 0939   ALKPHOS 110 09/06/2017 0939   BILITOT 0.4 09/06/2017 0939      Component Value Date/Time   TSH 2.190 09/06/2017 0939   Results for Caitlin Stark (MRN [id number]) as of 01/02/2018 11:59  Ref. Range 09/06/2017 09:39  Vitamin D, 25-Hydroxy Latest Ref Range: 30.0 - 100.0 ng/mL 20.6 (L)   ASSESSMENT AND PLAN: Vitamin D deficiency - Plan: VITAMIN D 25 Hydroxy (Vit-D Deficiency, Fractures), Vitamin D, Ergocalciferol, (DRISDOL) 50000 units CAPS capsule  Prediabetes  - Plan: Comprehensive metabolic panel, Hemoglobin A1c, Insulin, random, metFORMIN (GLUCOPHAGE) 500 MG tablet  At risk for diabetes mellitus  Class 1 obesity with serious comorbidity and body mass index (BMI) of 30.0 to 30.9 in adult, unspecified obesity type - Starting BMI greater then 30  PLAN:  Vitamin D Deficiency Caitlin Stark was informed that low vitamin D levels contributes to fatigue and are associated with obesity, breast, and colon cancer. She agrees to continue to take prescription Vit D @50 ,000 IU every week #4 with no refills. We will check labs today and she will  follow up for routine testing of vitamin D, at least 2-3 times per year. She was informed of the risk of over-replacement of vitamin D and agrees to not increase her dose unless she discusses this with [place] first. Larrisa agrees to follow up with our clinic in 2 weeks.  Pre-Diabetes Caitlin Stark will continue to work on weight loss, exercise, and decreasing simple carbohydrates in her diet to help decrease the risk of diabetes. We dicussed metformin including benefits and risks. She was informed that eating too many simple carbohydrates or too many calories at one sitting increases the likelihood of GI side effects. Caitlin Stark agrees to start metformin 500 mg qAM #30 with no refills and follow up with [place] as directed to monitor her progress.  Diabetes risk counseling Caitlin Stark was given extended (15 minutes) diabetes prevention counseling today. She is 47 y.o. female and has risk factors for diabetes including obesity and pre-diabetes. We discussed intensive lifestyle modifications today with an emphasis on weight loss as well as increasing exercise and decreasing simple carbohydrates in her diet.  Obesity Caitlin Stark is currently in the action stage of change. As such, her goal is to continue with weight loss efforts She has agreed to follow the Category 2 plan Caitlin Stark has been instructed to work up to a goal of 150 minutes of combined cardio and  strengthening exercise per week for weight loss and overall health benefits. We discussed the following Behavioral Modification Strategies today: increase H2O intake, better snacking choices, planning for success, increasing lean protein intake, work on meal planning and easy cooking plans and ways to avoid night time snacking  Caitlin Stark has agreed to follow up with our clinic in 2 weeks. She was informed of the importance of frequent follow up visits to maximize her success with intensive lifestyle modifications for her multiple health conditions.   OBESITY BEHAVIORAL INTERVENTION VISIT  Today's visit was # 7 out of 22.  Starting weight: 197 lbs Starting date: 09/06/17 Today's weight : 185 lbs Today's date: 01/02/2018 Total lbs lost to date: 12 (Patients must lose 7 lbs in the first 6 months to continue with counseling)   ASK: We discussed the diagnosis of obesity with Caitlin Stark today and Caitlin Stark agreed to give [place] permission to discuss obesity behavioral modification therapy today.  ASSESS: Caitlin Stark has the diagnosis of obesity and her BMI today is 28.97 Caitlin Stark is in the action stage of change   ADVISE: Caitlin Stark was educated on the multiple health risks of obesity as well as the benefit of weight loss to improve her health. She was advised of the need for long term treatment and the importance of lifestyle modifications.  AGREE: Multiple dietary modification options and treatment options were discussed and  Caitlin Stark agreed to the above obesity treatment plan.  I, Doreene Nest, am acting as transcriptionist for Eber Jones, MD  I have reviewed the above documentation for accuracy and completeness, and I agree with the above. - Ilene Qua, MD

## 2018-01-03 LAB — COMPREHENSIVE METABOLIC PANEL
ALBUMIN: 4.6 g/dL (ref 3.5–5.5)
ALK PHOS: 112 IU/L (ref 39–117)
ALT: 14 IU/L (ref 0–32)
AST: 16 IU/L (ref 0–40)
Albumin/Globulin Ratio: 1.5 (ref 1.2–2.2)
BUN / CREAT RATIO: 12 (ref 9–23)
BUN: 12 mg/dL (ref 6–24)
Bilirubin Total: 0.4 mg/dL (ref 0.0–1.2)
CO2: 22 mmol/L (ref 20–29)
CREATININE: 1 mg/dL (ref 0.57–1.00)
Calcium: 10.1 mg/dL (ref 8.7–10.2)
Chloride: 101 mmol/L (ref 96–106)
GFR calc non Af Amer: 67 mL/min/{1.73_m2} (ref >59)
GFR, EST AFRICAN AMERICAN: 78 mL/min/{1.73_m2} (ref >59)
GLUCOSE: 85 mg/dL (ref 65–99)
Globulin, Total: 3.1 g/dL (ref 1.5–4.5)
Potassium: 4.6 mmol/L (ref 3.5–5.2)
Sodium: 142 mmol/L (ref 134–144)
TOTAL PROTEIN: 7.7 g/dL (ref 6.0–8.5)

## 2018-01-03 LAB — HEMOGLOBIN A1C
Est. average glucose Bld gHb Est-mCnc: 117 mg/dL
HEMOGLOBIN A1C: 5.7 % — AB (ref 4.8–5.6)

## 2018-01-03 LAB — VITAMIN D 25 HYDROXY (VIT D DEFICIENCY, FRACTURES): Vit D, 25-Hydroxy: 43.6 ng/mL (ref 30.0–100.0)

## 2018-01-03 LAB — INSULIN, RANDOM: INSULIN: 10.1 u[IU]/mL (ref 2.6–24.9)

## 2018-01-17 ENCOUNTER — Ambulatory Visit (INDEPENDENT_AMBULATORY_CARE_PROVIDER_SITE_OTHER): Payer: BC Managed Care – PPO | Admitting: Physician Assistant

## 2018-01-17 VITALS — BP 119/74 | HR 59 | Temp 98.1°F | Ht 67.0 in | Wt 186.0 lb

## 2018-01-17 DIAGNOSIS — Z9189 Other specified personal risk factors, not elsewhere classified: Secondary | ICD-10-CM

## 2018-01-17 DIAGNOSIS — R7303 Prediabetes: Secondary | ICD-10-CM | POA: Diagnosis not present

## 2018-01-17 DIAGNOSIS — E559 Vitamin D deficiency, unspecified: Secondary | ICD-10-CM

## 2018-01-17 DIAGNOSIS — Z6830 Body mass index (BMI) 30.0-30.9, adult: Secondary | ICD-10-CM | POA: Diagnosis not present

## 2018-01-17 DIAGNOSIS — E669 Obesity, unspecified: Secondary | ICD-10-CM

## 2018-01-17 MED ORDER — VITAMIN D (ERGOCALCIFEROL) 1.25 MG (50000 UNIT) PO CAPS: 50000.0000 [IU] | ORAL_CAPSULE | ORAL | 0 refills | Status: DC

## 2018-01-17 MED ORDER — METFORMIN HCL 500 MG PO TABS: 500.0000 mg | ORAL_TABLET | Freq: Two times a day (BID) | ORAL | 0 refills | Status: DC

## 2018-01-17 NOTE — Progress Notes (Signed)
Office: [phone]  /  Fax: [phone]   HPI:   Chief Complaint: OBESITY Caitlin Stark is here to discuss her progress with her obesity treatment plan. She is on the Category 2 plan and is following her eating plan approximately 70 % of the time. She states she is walking for 60 minutes 7 times per week. Caitlin Stark had increase celebration eating. She is working on increasing her protein intake and follow the meal plan.  Her weight is 186 lb (84.4 kg) today and has gained 1 pound since her last visit. She has lost 11 lbs since starting treatment with [place].  Pre-Diabetes Caitlin Stark has a diagnosis of pre-diabetes based on her elevated Hgb A1c and was informed this puts her at greater risk of developing diabetes. She is taking metformin currently and continues to work on diet and exercise to decrease risk of diabetes. She notes polyphagia in the evening and denies nausea or hypoglycemia.  At risk for diabetes Caitlin Stark is at higher than average risk for developing diabetes due to her obesity and pre-diabetes. She currently denies polyuria or polydipsia.  Vitamin D Deficiency Caitlin Stark has a diagnosis of vitamin D deficiency. She is currently taking prescription Vit D and denies nausea, vomiting or muscle weakness.  ALLERGIES: Allergies  Allergen Reactions  . Amoxicillin     REACTION: eyes swelling  . Lisinopril     REACTION: swelling of eyes and lower lip  . Morphine And Related Hives    MEDICATIONS: Current Outpatient Medications on File Prior to Visit  Medication Sig Dispense Refill  . diltiazem (DILACOR XR) 180 MG 24 hr capsule Take 180 mg by mouth daily.      Marland Kitchen spironolactone (ALDACTONE) 25 MG tablet Take 1 tablet (25 mg total) by mouth 2 (two) times daily.     No current facility-administered medications on file prior to visit.     PAST MEDICAL HISTORY: Past Medical History:  Diagnosis Date  . Abnormal Pap smear 1993  . Anemia   . Breast mass, right   . Fibroid   . Hirsutism   .  Hypertension   . Menorrhagia   . Vitamin D deficiency     PAST SURGICAL HISTORY: Past Surgical History:  Procedure Laterality Date  . ABDOMINOPLASTY    . CESAREAN SECTION    . Diastasis Repair  2010  . HYSTEROSCOPY W/D&C    . LIPOSUCTION    . TUBAL LIGATION      SOCIAL HISTORY: Social History   Tobacco Use  . Smoking status: Never Smoker  . Smokeless tobacco: Never Used  Substance Use Topics  . Alcohol use: No  . Drug use: No    FAMILY HISTORY: Family History  Problem Relation Age of Onset  . Hypertension Other   . Anemia Other   . Autoimmune disease Other   . Diabetes Mother   . Hypertension Mother   . Hyperlipidemia Mother   . Obesity Mother   . Diabetes Father   . Hyperlipidemia Father   . Hypertension Father   . Sudden death Father   . Obesity Father     ROS: Review of Systems  Constitutional: Negative for weight loss.  Gastrointestinal: Negative for nausea and vomiting.  Genitourinary: Negative for frequency.  Musculoskeletal:       Negative muscle weakness  Endo/Heme/Allergies: Negative for polydipsia.       Positive polyphagia Negative hypoglycemia    PHYSICAL EXAM: Blood pressure 119/74, pulse (!) 59, temperature 98.1 F (36.7 C), temperature source Oral, height  5\' 7"  (1.702 m), weight 186 lb (84.4 kg), last menstrual period 09/10/2012, SpO2 98 %. Body mass index is 29.13 kg/m. Physical Exam  Constitutional: She is oriented to person, place, and time. She appears well-developed and well-nourished.  Cardiovascular: Bradycardia present.  Pulmonary/Chest: Effort normal.  Musculoskeletal: Normal range of motion.  Neurological: She is oriented to person, place, and time.  Skin: Skin is warm and dry.  Psychiatric: She has a normal mood and affect. Her behavior is normal.  Vitals reviewed.   RECENT LABS AND TESTS: BMET    Component Value Date/Time   NA 142 01/02/2018 0957   K 4.6 01/02/2018 0957   CL 101 01/02/2018 0957   CO2 22  01/02/2018 0957   GLUCOSE 85 01/02/2018 0957   BUN 12 01/02/2018 0957   CREATININE 1.00 01/02/2018 0957   CALCIUM 10.1 01/02/2018 0957   GFRNONAA 67 01/02/2018 0957   GFRAA 78 01/02/2018 0957   Lab Results  Component Value Date   HGBA1C 5.7 (H) 01/02/2018   HGBA1C 5.9 (H) 09/06/2017   Lab Results  Component Value Date   INSULIN 10.1 01/02/2018   INSULIN 7.0 09/06/2017   CBC    Component Value Date/Time   WBC 8.2 09/06/2017 0939   WBC 5.0 08/12/2011 1508   RBC 4.60 09/06/2017 0939   RBC 3.84 (L) 08/12/2011 1508   HGB 13.6 09/06/2017 0939   HCT 40.3 09/06/2017 0939   PLT 244 08/12/2011 1508   MCV 88 09/06/2017 0939   MCH 29.6 09/06/2017 0939   MCH 28.9 08/12/2011 1508   MCHC 33.7 09/06/2017 0939   MCHC 33.3 08/12/2011 1508   RDW 14.9 09/06/2017 0939   LYMPHSABS 2.4 09/06/2017 0939   MONOABS 0.5 08/12/2011 1508   EOSABS 0.1 09/06/2017 0939   BASOSABS 0.1 09/06/2017 0939   Iron/TIBC/Ferritin/ %Sat No results found for: IRON, TIBC, FERRITIN, IRONPCTSAT Lipid Panel     Component Value Date/Time   CHOL 171 09/06/2017 0939   TRIG 69 09/06/2017 0939   HDL 59 09/06/2017 0939   LDLCALC 98 09/06/2017 0939   Hepatic Function Panel     Component Value Date/Time   PROT 7.7 01/02/2018 0957   ALBUMIN 4.6 01/02/2018 0957   AST 16 01/02/2018 0957   ALT 14 01/02/2018 0957   ALKPHOS 112 01/02/2018 0957   BILITOT 0.4 01/02/2018 0957      Component Value Date/Time   TSH 2.190 09/06/2017 0939  Results for Caitlin, Stark (MRN [id number]) as of 01/17/2018 17:56  Ref. Range 01/02/2018 09:57  Vitamin D, 25-Hydroxy Latest Ref Range: 30.0 - 100.0 ng/mL 43.6    ASSESSMENT AND PLAN: Prediabetes - Plan: metFORMIN (GLUCOPHAGE) 500 MG tablet  Vitamin D deficiency - Plan: Vitamin D, Ergocalciferol, (DRISDOL) 50000 units CAPS capsule  At risk for diabetes mellitus  Class 1 obesity with serious comorbidity and body mass index (BMI) of 30.0 to 30.9 in adult, unspecified obesity  type - Starting BMI greater then 30  PLAN:  Pre-Diabetes Caitlin Stark will continue to work on weight loss, exercise, and decreasing simple carbohydrates in her diet to help decrease the risk of diabetes. We dicussed metformin including benefits and risks. She was informed that eating too many simple carbohydrates or too many calories at one sitting increases the likelihood of GI side effects. Caitlin Stark agrees to increase metformin 500 mg to BID #60 with no refills. Caitlin Stark agrees to follow up with our clinic in 3 weeks as directed to monitor her progress.  Diabetes risk counselling Caitlin Stark was  given extended (15 minutes) diabetes prevention counseling today. She is 47 y.o. female and has risk factors for diabetes including obesity and pre-diabetes. We discussed intensive lifestyle modifications today with an emphasis on weight loss as well as increasing exercise and decreasing simple carbohydrates in her diet.  Vitamin D Deficiency Caitlin Stark was informed that low vitamin D levels contributes to fatigue and are associated with obesity, breast, and colon cancer. Caitlin Stark agrees to continue taking prescription Vit D @50 ,000 IU every week #4 and we will refill for 1 month. She will follow up for routine testing of vitamin D, at least 2-3 times per year. She was informed of the risk of over-replacement of vitamin D and agrees to not increase her dose unless she discusses this with [place] first. Caitlin Stark agrees to follow up with our clinic in 3 weeks.  Obesity Caitlin Stark is currently in the action stage of change. As such, her goal is to continue with weight loss efforts She has agreed to follow the Category 2 plan Caitlin Stark has been instructed to work up to a goal of 150 minutes of combined cardio and strengthening exercise per week for weight loss and overall health benefits. We discussed the following Behavioral Modification Strategies today: increasing lean protein intake and work on meal planning and easy cooking  plans   Anicka has agreed to follow up with our clinic in 3 weeks. She was informed of the importance of frequent follow up visits to maximize her success with intensive lifestyle modifications for her multiple health conditions.   OBESITY BEHAVIORAL INTERVENTION VISIT  Today's visit was # 8 out of 22.  Starting weight: 197 lbs Starting date: 09/16/17 Today's weight : 186 lbs Today's date: 01/17/2018 Total lbs lost to date: 11 (Patients must lose 7 lbs in the first 6 months to continue with counseling)   ASK: We discussed the diagnosis of obesity with Caitlin Stark today and Caitlin Stark agreed to give [place] permission to discuss obesity behavioral modification therapy today.  ASSESS: Caitlin Stark has the diagnosis of obesity and her BMI today is 29.12 Caitlin Stark is in the action stage of change   ADVISE: Caitlin Stark was educated on the multiple health risks of obesity as well as the benefit of weight loss to improve her health. She was advised of the need for long term treatment and the importance of lifestyle modifications.  AGREE: Multiple dietary modification options and treatment options were discussed and  Caitlin Stark agreed to the above obesity treatment plan.   Wilhemena Durie, am acting as transcriptionist for Lacy Duverney, PA-C I, Lacy Duverney [facility], have reviewed this note and agree with its content

## 2018-02-05 ENCOUNTER — Encounter (INDEPENDENT_AMBULATORY_CARE_PROVIDER_SITE_OTHER): Payer: Self-pay

## 2018-02-05 ENCOUNTER — Ambulatory Visit (INDEPENDENT_AMBULATORY_CARE_PROVIDER_SITE_OTHER): Payer: BC Managed Care – PPO | Admitting: Physician Assistant

## 2018-02-14 ENCOUNTER — Ambulatory Visit (INDEPENDENT_AMBULATORY_CARE_PROVIDER_SITE_OTHER): Payer: BC Managed Care – PPO | Admitting: Family Medicine

## 2018-02-14 ENCOUNTER — Encounter (INDEPENDENT_AMBULATORY_CARE_PROVIDER_SITE_OTHER): Payer: Self-pay

## 2018-02-15 ENCOUNTER — Ambulatory Visit (INDEPENDENT_AMBULATORY_CARE_PROVIDER_SITE_OTHER): Payer: BC Managed Care – PPO | Admitting: Family Medicine

## 2018-02-15 VITALS — BP 115/72 | HR 65 | Temp 98.1°F | Ht 67.0 in | Wt 186.0 lb

## 2018-02-15 DIAGNOSIS — Z6830 Body mass index (BMI) 30.0-30.9, adult: Secondary | ICD-10-CM

## 2018-02-15 DIAGNOSIS — E669 Obesity, unspecified: Secondary | ICD-10-CM | POA: Diagnosis not present

## 2018-02-15 DIAGNOSIS — Z9189 Other specified personal risk factors, not elsewhere classified: Secondary | ICD-10-CM

## 2018-02-15 DIAGNOSIS — E559 Vitamin D deficiency, unspecified: Secondary | ICD-10-CM | POA: Diagnosis not present

## 2018-02-15 DIAGNOSIS — R7303 Prediabetes: Secondary | ICD-10-CM | POA: Diagnosis not present

## 2018-02-15 MED ORDER — VITAMIN D (ERGOCALCIFEROL) 1.25 MG (50000 UNIT) PO CAPS: 50000.0000 [IU] | ORAL_CAPSULE | ORAL | 0 refills | Status: DC

## 2018-02-15 MED ORDER — METFORMIN HCL 500 MG PO TABS: 500.0000 mg | ORAL_TABLET | Freq: Two times a day (BID) | ORAL | 0 refills | Status: DC

## 2018-02-15 NOTE — Progress Notes (Signed)
Office: [phone]  /  Fax: [phone]   HPI:   Chief Complaint: OBESITY Caitlin Stark is here to discuss her progress with her obesity treatment plan. She is on the Category 2 plan and is following her eating plan approximately 60 % of the time. She states she is walking 10,000 to 15,000 7 days per week. . Caitlin Stark has some traveling in the next two weeks, in which meals are provided, so she will try to make better choices. Her weight is 186 lb (84.4 kg) today and has maintained weight over a period of 4 weeks since her last visit. She has lost 11 lbs since starting treatment with [place].  Vitamin D deficiency Caitlin Stark has a diagnosis of vitamin D deficiency. Mata is currently taking vit D. Fatigue is improving and she denies nausea, vomiting or muscle weakness.  Pre-Diabetes Caitlin Stark has a diagnosis of prediabetes based on her elevated Hgb A1c and was informed this puts her at greater risk of developing diabetes. Metformin was increased at the last appointment to 1 pill BID, but she noticed some increased gas. Caitlin Stark continues to work on diet and exercise to decrease risk of diabetes. She denies nausea or hypoglycemia.  At risk for diabetes Caitlin Stark is at higher than average risk for developing diabetes due to her obesity and pre-diabetes. She currently denies polyuria or polydipsia.  ALLERGIES: Allergies  Allergen Reactions  . Amoxicillin     REACTION: eyes swelling  . Lisinopril     REACTION: swelling of eyes and lower lip  . Morphine And Related Hives    MEDICATIONS: Current Outpatient Medications on File Prior to Visit  Medication Sig Dispense Refill  . diltiazem (DILACOR XR) 180 MG 24 hr capsule Take 180 mg by mouth daily.      Marland Kitchen spironolactone (ALDACTONE) 25 MG tablet Take 1 tablet (25 mg total) by mouth 2 (two) times daily.     No current facility-administered medications on file prior to visit.     PAST MEDICAL HISTORY: Past Medical History:  Diagnosis Date  . Abnormal Pap  smear 1993  . Anemia   . Breast mass, right   . Fibroid   . Hirsutism   . Hypertension   . Menorrhagia   . Vitamin D deficiency     PAST SURGICAL HISTORY: Past Surgical History:  Procedure Laterality Date  . ABDOMINOPLASTY    . CESAREAN SECTION    . Diastasis Repair  2010  . HYSTEROSCOPY W/D&C    . LIPOSUCTION    . TUBAL LIGATION      SOCIAL HISTORY: Social History   Tobacco Use  . Smoking status: Never Smoker  . Smokeless tobacco: Never Used  Substance Use Topics  . Alcohol use: No  . Drug use: No    FAMILY HISTORY: Family History  Problem Relation Age of Onset  . Hypertension Other   . Anemia Other   . Autoimmune disease Other   . Diabetes Mother   . Hypertension Mother   . Hyperlipidemia Mother   . Obesity Mother   . Diabetes Father   . Hyperlipidemia Father   . Hypertension Father   . Sudden death Father   . Obesity Father     ROS: Review of Systems  Constitutional: Positive for malaise/fatigue. Negative for weight loss.  Gastrointestinal: Negative for nausea and vomiting.       Positive for gas  Genitourinary: Negative for frequency.  Musculoskeletal:       Negative for muscle weakness  Endo/Heme/Allergies: Negative for  polydipsia.       Negative for hypoglycemia    PHYSICAL EXAM: Blood pressure 115/72, pulse 65, temperature 98.1 F (36.7 C), temperature source Oral, height 5\' 7"  (1.702 m), weight 186 lb (84.4 kg), last menstrual period 09/10/2012, SpO2 98 %. Body mass index is 29.13 kg/m. Physical Exam  Constitutional: She is oriented to person, place, and time. She appears well-developed and well-nourished.  Cardiovascular: Normal rate.  Pulmonary/Chest: Effort normal.  Musculoskeletal: Normal range of motion.  Neurological: She is oriented to person, place, and time.  Skin: Skin is warm and dry.  Psychiatric: She has a normal mood and affect. Her behavior is normal.  Vitals reviewed.   RECENT LABS AND TESTS: BMET    Component  Value Date/Time   NA 142 01/02/2018 0957   K 4.6 01/02/2018 0957   CL 101 01/02/2018 0957   CO2 22 01/02/2018 0957   GLUCOSE 85 01/02/2018 0957   BUN 12 01/02/2018 0957   CREATININE 1.00 01/02/2018 0957   CALCIUM 10.1 01/02/2018 0957   GFRNONAA 67 01/02/2018 0957   GFRAA 78 01/02/2018 0957   Lab Results  Component Value Date   HGBA1C 5.7 (H) 01/02/2018   HGBA1C 5.9 (H) 09/06/2017   Lab Results  Component Value Date   INSULIN 10.1 01/02/2018   INSULIN 7.0 09/06/2017   CBC    Component Value Date/Time   WBC 8.2 09/06/2017 0939   WBC 5.0 08/12/2011 1508   RBC 4.60 09/06/2017 0939   RBC 3.84 (L) 08/12/2011 1508   HGB 13.6 09/06/2017 0939   HCT 40.3 09/06/2017 0939   PLT 244 08/12/2011 1508   MCV 88 09/06/2017 0939   MCH 29.6 09/06/2017 0939   MCH 28.9 08/12/2011 1508   MCHC 33.7 09/06/2017 0939   MCHC 33.3 08/12/2011 1508   RDW 14.9 09/06/2017 0939   LYMPHSABS 2.4 09/06/2017 0939   MONOABS 0.5 08/12/2011 1508   EOSABS 0.1 09/06/2017 0939   BASOSABS 0.1 09/06/2017 0939   Iron/TIBC/Ferritin/ %Sat No results found for: IRON, TIBC, FERRITIN, IRONPCTSAT Lipid Panel     Component Value Date/Time   CHOL 171 09/06/2017 0939   TRIG 69 09/06/2017 0939   HDL 59 09/06/2017 0939   LDLCALC 98 09/06/2017 0939   Hepatic Function Panel     Component Value Date/Time   PROT 7.7 01/02/2018 0957   ALBUMIN 4.6 01/02/2018 0957   AST 16 01/02/2018 0957   ALT 14 01/02/2018 0957   ALKPHOS 112 01/02/2018 0957   BILITOT 0.4 01/02/2018 0957      Component Value Date/Time   TSH 2.190 09/06/2017 0939   Results for Caitlin Stark, Caitlin Stark (MRN [id number]) as of 02/15/2018 14:20  Ref. Range 01/02/2018 09:57  Vitamin D, 25-Hydroxy Latest Ref Range: 30.0 - 100.0 ng/mL 43.6   ASSESSMENT AND PLAN: Vitamin D deficiency - Plan: Vitamin D, Ergocalciferol, (DRISDOL) 50000 units CAPS capsule  Prediabetes - Plan: metFORMIN (GLUCOPHAGE) 500 MG tablet  At risk for diabetes mellitus  Class 1  obesity with serious comorbidity and body mass index (BMI) of 30.0 to 30.9 in adult, unspecified obesity type - Starting BMI greater then 30  PLAN:  Vitamin D Deficiency Caitlin Stark was informed that low vitamin D levels contributes to fatigue and are associated with obesity, breast, and colon cancer. She agrees to continue to take prescription Vit D @50 ,000 IU every week #4 with no refills and will follow up for routine testing of vitamin D, at least 2-3 times per year. She was informed of the  risk of over-replacement of vitamin D and agrees to not increase her dose unless she discusses this with [place] first. Caitlin Stark agrees to follow up as directed.  Pre-Diabetes Siren will continue to work on weight loss, exercise, and decreasing simple carbohydrates in her diet to help decrease the risk of diabetes. We dicussed metformin including benefits and risks. She was informed that eating too many simple carbohydrates or too many calories at one sitting increases the likelihood of GI side effects. Caitlin Stark agrees to decrease metformin to 1 pill daily. A prescription was written today for metformin 500 mg BID #60 with no refills. Caitlin Stark agreed to follow up with [place] as directed to monitor her progress.  Diabetes risk counseling Caitlin Stark was given extended (15 minutes) diabetes prevention counseling today. She is 47 y.o. female and has risk factors for diabetes including obesity and pre-diabetes. We discussed intensive lifestyle modifications today with an emphasis on weight loss as well as increasing exercise and decreasing simple carbohydrates in her diet.  Obesity Caitlin Stark is currently in the action stage of change. As such, her goal is to continue with weight loss efforts She has agreed to follow the Category 2 plan Caitlin Stark has been instructed to work up to a goal of 150 minutes of combined cardio and strengthening exercise per week for weight loss and overall health benefits. We discussed the following Behavioral  Modification Strategies today: planning for success, better snacking choices, increasing lean protein intake, increasing vegetables and work on meal planning and easy cooking plans  Caitlin Stark has agreed to follow up with our clinic in 2 weeks. She was informed of the importance of frequent follow up visits to maximize her success with intensive lifestyle modifications for her multiple health conditions.   OBESITY BEHAVIORAL INTERVENTION VISIT  Today's visit was # 9 out of 22.  Starting weight: 197 lbs Starting date: 09/16/17 Today's weight : 186 lbs Today's date: 02/15/2018 Total lbs lost to date: 11 (Patients must lose 7 lbs in the first 6 months to continue with counseling)   ASK: We discussed the diagnosis of obesity with Caitlin Stark today and Caitlin Stark agreed to give [place] permission to discuss obesity behavioral modification therapy today.  ASSESS: Caitlin Stark has the diagnosis of obesity and her BMI today is 29.12 Caitlin Stark is in the action stage of change   ADVISE: Caitlin Stark was educated on the multiple health risks of obesity as well as the benefit of weight loss to improve her health. She was advised of the need for long term treatment and the importance of lifestyle modifications.  AGREE: Multiple dietary modification options and treatment options were discussed and  Caitlin Stark agreed to the above obesity treatment plan.  I, Doreene Nest, am acting as transcriptionist for Eber Jones, MD  I have reviewed the above documentation for accuracy and completeness, and I agree with the above. - Ilene Qua, MD

## 2018-03-05 ENCOUNTER — Ambulatory Visit (INDEPENDENT_AMBULATORY_CARE_PROVIDER_SITE_OTHER): Payer: BC Managed Care – PPO | Admitting: Family Medicine

## 2018-03-05 VITALS — BP 116/71 | HR 67 | Temp 97.6°F | Ht 67.0 in | Wt 190.0 lb

## 2018-03-05 DIAGNOSIS — Z6830 Body mass index (BMI) 30.0-30.9, adult: Secondary | ICD-10-CM

## 2018-03-05 DIAGNOSIS — R7303 Prediabetes: Secondary | ICD-10-CM

## 2018-03-05 DIAGNOSIS — E669 Obesity, unspecified: Secondary | ICD-10-CM | POA: Diagnosis not present

## 2018-03-05 DIAGNOSIS — E559 Vitamin D deficiency, unspecified: Secondary | ICD-10-CM | POA: Diagnosis not present

## 2018-03-05 NOTE — Progress Notes (Signed)
Office: [phone]  /  Fax: [phone]   HPI:   Chief Complaint: OBESITY Caitlin Stark is here to discuss her progress with her obesity treatment plan. She is on the Category 2 plan and is following her eating plan approximately 60 % of the time. She states she was in [place] walking for 2 hours 3 times per week. Caitlin Stark was in [place] last week so found it difficult to eat on plan. Feels like she ate a bit too much chicken the first 3 weeks of the plan.  Her weight is 190 lb (86.2 kg) today and has gained 4 pounds since her last visit. She has lost 7 lbs since starting treatment with [place].  Vitamin D Deficiency Caitlin Stark has a diagnosis of vitamin D deficiency. She is currently taking prescription Vit D. She notes improving fatigue and denies nausea, vomiting or muscle weakness.  Pre-Diabetes Caitlin Stark has a diagnosis of pre-diabetes based on her elevated Hgb A1c and was informed this puts her at greater risk of developing diabetes. She denies hypoglycemia and GI side effects of metformin, and continues to work on diet and exercise to decrease risk of diabetes.   ALLERGIES: Allergies  Allergen Reactions  . Amoxicillin     REACTION: eyes swelling  . Lisinopril     REACTION: swelling of eyes and lower lip  . Morphine And Related Hives    MEDICATIONS: Current Outpatient Medications on File Prior to Visit  Medication Sig Dispense Refill  . diltiazem (DILACOR XR) 180 MG 24 hr capsule Take 180 mg by mouth daily.      . metFORMIN (GLUCOPHAGE) 500 MG tablet Take 1 tablet (500 mg total) by mouth 2 (two) times daily with a meal. 60 tablet 0  . spironolactone (ALDACTONE) 25 MG tablet Take 1 tablet (25 mg total) by mouth 2 (two) times daily.    . Vitamin D, Ergocalciferol, (DRISDOL) 50000 units CAPS capsule Take 1 capsule (50,000 Units total) by mouth every 7 (seven) days. 4 capsule 0   No current facility-administered medications on file prior to visit.     PAST MEDICAL HISTORY: Past Medical  History:  Diagnosis Date  . Abnormal Pap smear 1993  . Anemia   . Breast mass, right   . Fibroid   . Hirsutism   . Hypertension   . Menorrhagia   . Vitamin D deficiency     PAST SURGICAL HISTORY: Past Surgical History:  Procedure Laterality Date  . ABDOMINOPLASTY    . CESAREAN SECTION    . Diastasis Repair  2010  . HYSTEROSCOPY W/D&C    . LIPOSUCTION    . TUBAL LIGATION      SOCIAL HISTORY: Social History   Tobacco Use  . Smoking status: Never Smoker  . Smokeless tobacco: Never Used  Substance Use Topics  . Alcohol use: No  . Drug use: No    FAMILY HISTORY: Family History  Problem Relation Age of Onset  . Hypertension Other   . Anemia Other   . Autoimmune disease Other   . Diabetes Mother   . Hypertension Mother   . Hyperlipidemia Mother   . Obesity Mother   . Diabetes Father   . Hyperlipidemia Father   . Hypertension Father   . Sudden death Father   . Obesity Father     ROS: Review of Systems  Constitutional: Positive for malaise/fatigue. Negative for weight loss.  Gastrointestinal: Negative for nausea and vomiting.  Musculoskeletal:       Negative muscle weakness  Endo/Heme/Allergies:       Negative hypoglycemia    PHYSICAL EXAM: Blood pressure 116/71, pulse 67, temperature 97.6 F (36.4 C), temperature source Oral, height 5\' 7"  (1.702 m), weight 190 lb (86.2 kg), last menstrual period 09/10/2012, SpO2 99 %. Body mass index is 29.76 kg/m. Physical Exam  Constitutional: She is oriented to person, place, and time. She appears well-developed and well-nourished.  Cardiovascular: Normal rate.  Pulmonary/Chest: Effort normal.  Musculoskeletal: Normal range of motion.  Neurological: She is oriented to person, place, and time.  Skin: Skin is warm and dry.  Psychiatric: She has a normal mood and affect. Her behavior is normal.  Vitals reviewed.   RECENT LABS AND TESTS: BMET    Component Value Date/Time   NA 142 01/02/2018 0957   K 4.6  01/02/2018 0957   CL 101 01/02/2018 0957   CO2 22 01/02/2018 0957   GLUCOSE 85 01/02/2018 0957   BUN 12 01/02/2018 0957   CREATININE 1.00 01/02/2018 0957   CALCIUM 10.1 01/02/2018 0957   GFRNONAA 67 01/02/2018 0957   GFRAA 78 01/02/2018 0957   Lab Results  Component Value Date   HGBA1C 5.7 (H) 01/02/2018   HGBA1C 5.9 (H) 09/06/2017   Lab Results  Component Value Date   INSULIN 10.1 01/02/2018   INSULIN 7.0 09/06/2017   CBC    Component Value Date/Time   WBC 8.2 09/06/2017 0939   WBC 5.0 08/12/2011 1508   RBC 4.60 09/06/2017 0939   RBC 3.84 (L) 08/12/2011 1508   HGB 13.6 09/06/2017 0939   HCT 40.3 09/06/2017 0939   PLT 244 08/12/2011 1508   MCV 88 09/06/2017 0939   MCH 29.6 09/06/2017 0939   MCH 28.9 08/12/2011 1508   MCHC 33.7 09/06/2017 0939   MCHC 33.3 08/12/2011 1508   RDW 14.9 09/06/2017 0939   LYMPHSABS 2.4 09/06/2017 0939   MONOABS 0.5 08/12/2011 1508   EOSABS 0.1 09/06/2017 0939   BASOSABS 0.1 09/06/2017 0939   Iron/TIBC/Ferritin/ %Sat No results found for: IRON, TIBC, FERRITIN, IRONPCTSAT Lipid Panel     Component Value Date/Time   CHOL 171 09/06/2017 0939   TRIG 69 09/06/2017 0939   HDL 59 09/06/2017 0939   LDLCALC 98 09/06/2017 0939   Hepatic Function Panel     Component Value Date/Time   PROT 7.7 01/02/2018 0957   ALBUMIN 4.6 01/02/2018 0957   AST 16 01/02/2018 0957   ALT 14 01/02/2018 0957   ALKPHOS 112 01/02/2018 0957   BILITOT 0.4 01/02/2018 0957      Component Value Date/Time   TSH 2.190 09/06/2017 0939  Results for ADREONA, BRAND (MRN [id number]) as of 03/05/2018 15:17  Ref. Range 01/02/2018 09:57  Vitamin D, 25-Hydroxy Latest Ref Range: 30.0 - 100.0 ng/mL 43.6    ASSESSMENT AND PLAN: Vitamin D deficiency  Prediabetes  Class 1 obesity with serious comorbidity and body mass index (BMI) of 30.0 to 30.9 in adult, unspecified obesity type - Starting BMI greater then 30  PLAN:  Vitamin D Deficiency Caitlin Stark was informed that low  vitamin D levels contributes to fatigue and are associated with obesity, breast, and colon cancer. Caitlin Stark agrees to continue taking prescription Vit D @50 ,000 IU every week, no refill needed. She will follow up for routine testing of vitamin D, at least 2-3 times per year. She was informed of the risk of over-replacement of vitamin D and agrees to not increase her dose unless she discusses this with [place] first. Caitlin Stark agrees to follow up with  our clinic in 2 weeks.  Pre-Diabetes Caitlin Stark will continue to work on weight loss, exercise, and decreasing simple carbohydrates in her diet to help decrease the risk of diabetes. We dicussed metformin including benefits and risks. She was informed that eating too many simple carbohydrates or too many calories at one sitting increases the likelihood of GI side effects. Caitlin Stark agrees to continue taking metformin, no refill needed and she agrees to follow up with our clinic in 2 weeks as directed to monitor her progress.  We spent > than 50% of the 15 minute visit on the counseling as documented in the note.  Obesity Caitlin Stark is currently in the action stage of change. As such, her goal is to continue with weight loss efforts She has agreed to follow the Category 2 plan + 100 calories Caitlin Stark has been instructed to work up to a goal of 150 minutes of combined cardio and strengthening exercise per week for weight loss and overall health benefits. We discussed the following Behavioral Modification Strategies today: increasing lean protein intake, increasing vegetables, work on meal planning and easy cooking plans, and planning for success Caitlin Stark is going to start orange therapy.  Caitlin Stark has agreed to follow up with our clinic in 2 weeks. She was informed of the importance of frequent follow up visits to maximize her success with intensive lifestyle modifications for her multiple health conditions.   OBESITY BEHAVIORAL INTERVENTION VISIT  Today's visit was # 10 out of  22.  Starting weight: 197 lbs Starting date: 09/06/17 Today's weight : 190 lbs  Today's date: 03/05/2018 Total lbs lost to date: 7 (Patients must lose 7 lbs in the first 6 months to continue with counseling)   ASK: We discussed the diagnosis of obesity with Caitlin Stark today and Caitlin Stark agreed to give [place] permission to discuss obesity behavioral modification therapy today.  ASSESS: Caitlin Stark has the diagnosis of obesity and her BMI today is 29.75 Caitlin Stark is in the action stage of change   ADVISE: Caitlin Stark was educated on the multiple health risks of obesity as well as the benefit of weight loss to improve her health. She was advised of the need for long term treatment and the importance of lifestyle modifications.  AGREE: Multiple dietary modification options and treatment options were discussed and  Caitlin Stark agreed to the above obesity treatment plan.  I, Trixie Dredge, am acting as transcriptionist for Ilene Qua, MD  I have reviewed the above documentation for accuracy and completeness, and I agree with the above. - Ilene Qua, MD

## 2018-03-22 ENCOUNTER — Ambulatory Visit (INDEPENDENT_AMBULATORY_CARE_PROVIDER_SITE_OTHER): Payer: BC Managed Care – PPO | Admitting: Physician Assistant

## 2018-03-22 VITALS — BP 113/69 | HR 63 | Temp 97.7°F | Ht 67.0 in | Wt 195.0 lb

## 2018-03-22 DIAGNOSIS — E559 Vitamin D deficiency, unspecified: Secondary | ICD-10-CM | POA: Diagnosis not present

## 2018-03-22 DIAGNOSIS — E669 Obesity, unspecified: Secondary | ICD-10-CM | POA: Diagnosis not present

## 2018-03-22 DIAGNOSIS — I10 Essential (primary) hypertension: Secondary | ICD-10-CM

## 2018-03-22 DIAGNOSIS — Z9189 Other specified personal risk factors, not elsewhere classified: Secondary | ICD-10-CM

## 2018-03-22 DIAGNOSIS — Z6830 Body mass index (BMI) 30.0-30.9, adult: Secondary | ICD-10-CM | POA: Diagnosis not present

## 2018-03-22 MED ORDER — VITAMIN D (ERGOCALCIFEROL) 1.25 MG (50000 UNIT) PO CAPS: 50000.0000 [IU] | ORAL_CAPSULE | ORAL | 0 refills | Status: DC

## 2018-03-22 NOTE — Progress Notes (Signed)
Office: [phone]  /  Fax: [phone]   HPI:   Chief Complaint: OBESITY Caitlin Stark Stark here to discuss her progress with her obesity treatment plan. She Stark on the Category 2 plan + 100 calories and Stark following her eating plan approximately 80 % of the time. She states she Stark doing orange theory and home workout for 60 minutes 4 times per week. Caitlin Stark has increased her exercise and has needed and increase in her calories because her hunger has not been as well controlled. Also, pt states she has not been eating the recommended protein on the meal plan due to some financial reasons. Her weight Stark 195 lb (88.5 kg) today and has gained 5 pounds since her last visit. She has lost 2 lbs since starting treatment with [place].  Vitamin D Deficiency Caitlin Stark has a diagnosis of vitamin D deficiency. She Stark currently taking prescription Vit D and denies nausea, vomiting or muscle weakness.  Hypertension Caitlin Stark Stark a 47 y.o. female with hypertension. Caitlin Stark's blood pressure Stark stable and she denies chest pain or shortness of breath. She Stark working weight loss to help control her blood pressure with the goal of decreasing her risk of heart attack and stroke. Caitlin Stark's blood pressure Stark currently controlled.  At risk for cardiovascular disease Caitlin Stark at a higher than average risk for cardiovascular disease due to obesity and hypertension. She currently denies any chest pain.  ALLERGIES: Allergies  Allergen Reactions  . Amoxicillin     REACTION: eyes swelling  . Lisinopril     REACTION: swelling of eyes and lower lip  . Morphine And Related Hives    MEDICATIONS: Current Outpatient Medications on File Prior to Visit  Medication Sig Dispense Refill  . diltiazem (DILACOR XR) 180 MG 24 hr capsule Take 180 mg by mouth daily.      . metFORMIN (GLUCOPHAGE) 500 MG tablet Take 1 tablet (500 mg total) by mouth 2 (two) times daily with a meal. 60 tablet 0  . spironolactone (ALDACTONE) 25 MG tablet  Take 1 tablet (25 mg total) by mouth 2 (two) times daily.    . Vitamin D, Ergocalciferol, (DRISDOL) 50000 units CAPS capsule Take 1 capsule (50,000 Units total) by mouth every 7 (seven) days. 4 capsule 0   No current facility-administered medications on file prior to visit.     PAST MEDICAL HISTORY: Past Medical History:  Diagnosis Date  . Abnormal Pap smear 1993  . Anemia   . Breast mass, right   . Fibroid   . Hirsutism   . Hypertension   . Menorrhagia   . Vitamin D deficiency     PAST SURGICAL HISTORY: Past Surgical History:  Procedure Laterality Date  . ABDOMINOPLASTY    . CESAREAN SECTION    . Diastasis Repair  2010  . HYSTEROSCOPY W/D&C    . LIPOSUCTION    . TUBAL LIGATION      SOCIAL HISTORY: Social History   Tobacco Use  . Smoking status: Never Smoker  . Smokeless tobacco: Never Used  Substance Use Topics  . Alcohol use: No  . Drug use: No    FAMILY HISTORY: Family History  Problem Relation Age of Onset  . Hypertension Other   . Anemia Other   . Autoimmune disease Other   . Diabetes Mother   . Hypertension Mother   . Hyperlipidemia Mother   . Obesity Mother   . Diabetes Father   . Hyperlipidemia Father   . Hypertension Father   .  Sudden death Father   . Obesity Father     ROS: Review of Systems  Constitutional: Negative for weight loss.  Respiratory: Negative for shortness of breath.   Cardiovascular: Negative for chest pain.  Gastrointestinal: Negative for nausea and vomiting.  Musculoskeletal:       Negative muscle weakness    PHYSICAL EXAM: Blood pressure 113/69, pulse 63, temperature 97.7 F (36.5 C), temperature source Oral, height 5\' 7"  (1.702 m), weight 195 lb (88.5 kg), last menstrual period 09/10/2012, SpO2 98 %. Body mass index Stark 30.54 kg/m. Physical Exam  Constitutional: She Stark oriented to person, place, and time. She appears well-developed and well-nourished.  Cardiovascular: Normal rate.  Pulmonary/Chest: Effort normal.    Musculoskeletal: Normal range of motion.  Neurological: She Stark oriented to person, place, and time.  Skin: Skin Stark warm and dry.  Psychiatric: She has a normal mood and affect. Her behavior Stark normal.  Vitals reviewed.   RECENT LABS AND TESTS: BMET    Component Value Date/Time   NA 142 01/02/2018 0957   K 4.6 01/02/2018 0957   CL 101 01/02/2018 0957   CO2 22 01/02/2018 0957   GLUCOSE 85 01/02/2018 0957   BUN 12 01/02/2018 0957   CREATININE 1.00 01/02/2018 0957   CALCIUM 10.1 01/02/2018 0957   GFRNONAA 67 01/02/2018 0957   GFRAA 78 01/02/2018 0957   Lab Results  Component Value Date   HGBA1C 5.7 (H) 01/02/2018   HGBA1C 5.9 (H) 09/06/2017   Lab Results  Component Value Date   INSULIN 10.1 01/02/2018   INSULIN 7.0 09/06/2017   CBC    Component Value Date/Time   WBC 8.2 09/06/2017 0939   WBC 5.0 08/12/2011 1508   RBC 4.60 09/06/2017 0939   RBC 3.84 (L) 08/12/2011 1508   HGB 13.6 09/06/2017 0939   HCT 40.3 09/06/2017 0939   PLT 244 08/12/2011 1508   MCV 88 09/06/2017 0939   MCH 29.6 09/06/2017 0939   MCH 28.9 08/12/2011 1508   MCHC 33.7 09/06/2017 0939   MCHC 33.3 08/12/2011 1508   RDW 14.9 09/06/2017 0939   LYMPHSABS 2.4 09/06/2017 0939   MONOABS 0.5 08/12/2011 1508   EOSABS 0.1 09/06/2017 0939   BASOSABS 0.1 09/06/2017 0939   Iron/TIBC/Ferritin/ %Sat No results found for: IRON, TIBC, FERRITIN, IRONPCTSAT Lipid Panel     Component Value Date/Time   CHOL 171 09/06/2017 0939   TRIG 69 09/06/2017 0939   HDL 59 09/06/2017 0939   LDLCALC 98 09/06/2017 0939   Hepatic Function Panel     Component Value Date/Time   PROT 7.7 01/02/2018 0957   ALBUMIN 4.6 01/02/2018 0957   AST 16 01/02/2018 0957   ALT 14 01/02/2018 0957   ALKPHOS 112 01/02/2018 0957   BILITOT 0.4 01/02/2018 0957      Component Value Date/Time   TSH 2.190 09/06/2017 0939  Results for Caitlin Stark (MRN [id number]) as of 03/22/2018 16:16  Ref. Range 01/02/2018 09:57  Vitamin D,  25-Hydroxy Latest Ref Range: 30.0 - 100.0 ng/mL 43.6    ASSESSMENT AND PLAN: Vitamin D deficiency - Plan: Vitamin D, Ergocalciferol, (DRISDOL) 50000 units CAPS capsule  Essential hypertension  At risk for heart disease  Class 1 obesity with serious comorbidity and body mass index (BMI) of 30.0 to 30.9 in adult, unspecified obesity type  PLAN:  Vitamin D Deficiency Caitlin Stark was informed that low vitamin D levels contributes to fatigue and are associated with obesity, breast, and colon cancer. Caitlin Stark agrees to continue taking  prescription Vit D @50 ,000 IU every week #4 and we will refill for 1 month. She will follow up for routine testing of vitamin D, at least 2-3 times per year. She was informed of the risk of over-replacement of vitamin D and agrees to not increase her dose unless she discusses this with [place] first. Delmy agrees to follow up with our clinic in 2 weeks.  Hypertension We discussed sodium restriction, working on healthy weight loss, and a regular exercise program as the means to achieve improved blood pressure control. Caitlin Stark agreed with this plan and agreed to follow up as directed. We will continue to monitor her blood pressure as well as her progress with the above lifestyle modifications. Caitlin Stark agrees to continue her medications and will watch for signs of hypotension as she continues her lifestyle modifications. Caitlin Stark agrees to follow up with our clinic in 2 weeks.  Cardiovascular risk counselling Caitlin Stark was given extended (15 minutes) coronary artery disease prevention counseling today. She Stark 47 y.o. female and has risk factors for heart disease including obesity and hypertension. We discussed intensive lifestyle modifications today with an emphasis on specific weight loss instructions and strategies. Pt was also informed of the importance of increasing exercise and decreasing saturated fats to help prevent heart disease.  Obesity Caitlin Stark Stark currently in the action stage  of change. As such, her goal Stark to continue with weight loss efforts She has agreed to follow the Category 2 plan + 100 calories Caitlin Stark has been instructed to work up to a goal of 150 minutes of combined cardio and strengthening exercise per week for weight loss and overall health benefits. We discussed the following Behavioral Modification Strategies today: increasing lean protein intake and work on meal planning and easy cooking plans   Caitlin Stark has agreed to follow up with our clinic in 2 weeks. She was informed of the importance of frequent follow up visits to maximize her success with intensive lifestyle modifications for her multiple health conditions.   OBESITY BEHAVIORAL INTERVENTION VISIT  Today's visit was # 11 out of 22.  Starting weight: 197 lbs Starting date: 09/06/17 Today's weight : 195 lbs  Today's date: 03/22/2018 Total lbs lost to date: 2 (Patients must lose 7 lbs in the first 6 months to continue with counseling)   ASK: We discussed the diagnosis of obesity with Caitlin Stark today and Caitlin Stark agreed to give [place] permission to discuss obesity behavioral modification therapy today.  ASSESS: Caitlin Stark has the diagnosis of obesity and her BMI today Stark 30.53 Caitlin Stark Stark in the action stage of change   ADVISE: Caitlin Stark was educated on the multiple health risks of obesity as well as the benefit of weight loss to improve her health. She was advised of the need for long term treatment and the importance of lifestyle modifications.  AGREE: Multiple dietary modification options and treatment options were discussed and  Addelynn agreed to the above obesity treatment plan.   Wilhemena Durie, am acting as transcriptionist for Lacy Duverney, PA-C I, Lacy Duverney [facility], have reviewed this note and agree with its content

## 2018-04-03 ENCOUNTER — Other Ambulatory Visit (INDEPENDENT_AMBULATORY_CARE_PROVIDER_SITE_OTHER): Payer: Self-pay | Admitting: Family Medicine

## 2018-04-03 DIAGNOSIS — R7303 Prediabetes: Secondary | ICD-10-CM

## 2018-04-16 ENCOUNTER — Ambulatory Visit (INDEPENDENT_AMBULATORY_CARE_PROVIDER_SITE_OTHER): Payer: BC Managed Care – PPO | Admitting: Physician Assistant

## 2018-04-23 ENCOUNTER — Ambulatory Visit (INDEPENDENT_AMBULATORY_CARE_PROVIDER_SITE_OTHER): Payer: BC Managed Care – PPO | Admitting: Family Medicine

## 2018-04-23 VITALS — BP 128/77 | HR 68 | Temp 97.8°F | Ht 67.0 in | Wt 200.0 lb

## 2018-04-23 DIAGNOSIS — Z9189 Other specified personal risk factors, not elsewhere classified: Secondary | ICD-10-CM | POA: Diagnosis not present

## 2018-04-23 DIAGNOSIS — E669 Obesity, unspecified: Secondary | ICD-10-CM

## 2018-04-23 DIAGNOSIS — E559 Vitamin D deficiency, unspecified: Secondary | ICD-10-CM | POA: Diagnosis not present

## 2018-04-23 DIAGNOSIS — Z6831 Body mass index (BMI) 31.0-31.9, adult: Secondary | ICD-10-CM

## 2018-04-23 DIAGNOSIS — R7303 Prediabetes: Secondary | ICD-10-CM

## 2018-04-23 DIAGNOSIS — R0602 Shortness of breath: Secondary | ICD-10-CM

## 2018-04-23 DIAGNOSIS — R5383 Other fatigue: Secondary | ICD-10-CM

## 2018-04-23 MED ORDER — VITAMIN D (ERGOCALCIFEROL) 1.25 MG (50000 UNIT) PO CAPS: 50000.0000 [IU] | ORAL_CAPSULE | ORAL | 0 refills | Status: DC

## 2018-04-23 NOTE — Progress Notes (Signed)
Office: [phone]  /  Fax: [phone]   HPI:   Chief Complaint: OBESITY Caitlin Stark is here to discuss her progress with her obesity treatment plan. She is on the Category 2 plan +100 calories and is following her eating plan approximately 85 % of the time. She states she is doing [employer] 60 minutes 3 times per week. Caitlin Stark is frustrated today and has had two visits of gaining weight. She has been consistently exercising at least three times a week. Her weight is 200 lb (90.7 kg) today and has not lost weight since her last visit. She has lost 0 lbs since starting treatment with [place].  Vitamin D deficiency Caitlin Stark has a diagnosis of vitamin D deficiency. Caitlin Stark is currently taking vit D and she admits fatigue, but denies nausea, vomiting or muscle weakness.  At risk for osteopenia and osteoporosis Caitlin Stark is at higher risk of osteopenia and osteoporosis due to vitamin D deficiency.   Pre-Diabetes Caitlin Stark has a diagnosis of prediabetes based on her elevated Hgb A1c and was informed this puts her at greater risk of developing diabetes. Her last A1c from 01/02/18 improved to 5.7. She is taking metformin currently and continues to work on diet and exercise to decrease risk of diabetes. She denies nausea or hypoglycemia.  Fatigue Caitlin Stark reports significant fatigue, even with Vitamin D replacement.   Dyspnea with Exercise Caitlin Stark notes increasing shortness of breath with exercising and seems to be worsening over time with weight gain. She notes getting out of breath sooner with activity than she used to. This has not gotten worse recently. Caitlin Stark denies shortness of breath at rest or orthopnea.  ALLERGIES: Allergies  Allergen Reactions  . Amoxicillin     REACTION: eyes swelling  . Lisinopril     REACTION: swelling of eyes and lower lip  . Morphine And Related Hives    MEDICATIONS: Current Outpatient Medications on File Prior to Visit  Medication Sig Dispense Refill  . diltiazem  (DILACOR XR) 180 MG 24 hr capsule Take 180 mg by mouth daily.      . metFORMIN (GLUCOPHAGE) 500 MG tablet TAKE  (1)  TABLET TWICE A DAY WITH MEALS (BREAKFAST AND SUPPER) 60 tablet 0  . spironolactone (ALDACTONE) 25 MG tablet Take 1 tablet (25 mg total) by mouth 2 (two) times daily.     No current facility-administered medications on file prior to visit.     PAST MEDICAL HISTORY: Past Medical History:  Diagnosis Date  . Abnormal Pap smear 1993  . Anemia   . Breast mass, right   . Fibroid   . Hirsutism   . Hypertension   . Menorrhagia   . Vitamin D deficiency     PAST SURGICAL HISTORY: Past Surgical History:  Procedure Laterality Date  . ABDOMINOPLASTY    . CESAREAN SECTION    . Diastasis Repair  2010  . HYSTEROSCOPY W/D&C    . LIPOSUCTION    . TUBAL LIGATION      SOCIAL HISTORY: Social History   Tobacco Use  . Smoking status: Never Smoker  . Smokeless tobacco: Never Used  Substance Use Topics  . Alcohol use: No  . Drug use: No    FAMILY HISTORY: Family History  Problem Relation Age of Onset  . Hypertension Other   . Anemia Other   . Autoimmune disease Other   . Diabetes Mother   . Hypertension Mother   . Hyperlipidemia Mother   . Obesity Mother   . Diabetes Father   .  Hyperlipidemia Father   . Hypertension Father   . Sudden death Father   . Obesity Father     ROS: Review of Systems  Constitutional: Positive for malaise/fatigue. Negative for weight loss.  Respiratory: Positive for shortness of breath (on exertion).   Gastrointestinal: Negative for nausea and vomiting.  Musculoskeletal:       Negative for muscle weakness  Endo/Heme/Allergies:       Negative for hypoglycemia    PHYSICAL EXAM: Blood pressure 128/77, pulse 68, temperature 97.8 F (36.6 C), temperature source Oral, height 5\' 7"  (1.702 m), weight 200 lb (90.7 kg), last menstrual period 09/10/2012, SpO2 98 %. Body mass index is 31.32 kg/m. Physical Exam  Constitutional: She is  oriented to person, place, and time. She appears well-developed and well-nourished.  Cardiovascular: Normal rate.  Pulmonary/Chest: Effort normal.  Musculoskeletal: Normal range of motion.  Neurological: She is oriented to person, place, and time.  Skin: Skin is warm and dry.  Psychiatric: She has a normal mood and affect. Her behavior is normal.  Vitals reviewed.   RECENT LABS AND TESTS: BMET    Component Value Date/Time   NA 142 01/02/2018 0957   K 4.6 01/02/2018 0957   CL 101 01/02/2018 0957   CO2 22 01/02/2018 0957   GLUCOSE 85 01/02/2018 0957   BUN 12 01/02/2018 0957   CREATININE 1.00 01/02/2018 0957   CALCIUM 10.1 01/02/2018 0957   GFRNONAA 67 01/02/2018 0957   GFRAA 78 01/02/2018 0957   Lab Results  Component Value Date   HGBA1C 5.7 (H) 01/02/2018   HGBA1C 5.9 (H) 09/06/2017   Lab Results  Component Value Date   INSULIN 10.1 01/02/2018   INSULIN 7.0 09/06/2017   CBC    Component Value Date/Time   WBC 8.2 09/06/2017 0939   WBC 5.0 08/12/2011 1508   RBC 4.60 09/06/2017 0939   RBC 3.84 (L) 08/12/2011 1508   HGB 13.6 09/06/2017 0939   HCT 40.3 09/06/2017 0939   PLT 244 08/12/2011 1508   MCV 88 09/06/2017 0939   MCH 29.6 09/06/2017 0939   MCH 28.9 08/12/2011 1508   MCHC 33.7 09/06/2017 0939   MCHC 33.3 08/12/2011 1508   RDW 14.9 09/06/2017 0939   LYMPHSABS 2.4 09/06/2017 0939   MONOABS 0.5 08/12/2011 1508   EOSABS 0.1 09/06/2017 0939   BASOSABS 0.1 09/06/2017 0939   Iron/TIBC/Ferritin/ %Sat No results found for: IRON, TIBC, FERRITIN, IRONPCTSAT Lipid Panel     Component Value Date/Time   CHOL 171 09/06/2017 0939   TRIG 69 09/06/2017 0939   HDL 59 09/06/2017 0939   LDLCALC 98 09/06/2017 0939   Hepatic Function Panel     Component Value Date/Time   PROT 7.7 01/02/2018 0957   ALBUMIN 4.6 01/02/2018 0957   AST 16 01/02/2018 0957   ALT 14 01/02/2018 0957   ALKPHOS 112 01/02/2018 0957   BILITOT 0.4 01/02/2018 0957      Component Value Date/Time    TSH 2.190 09/06/2017 0939   Results for Caitlin Stark, Caitlin Stark (MRN [id number]) as of 04/23/2018 16:55  Ref. Range 01/02/2018 09:57  Vitamin D, 25-Hydroxy Latest Ref Range: 30.0 - 100.0 ng/mL 43.6   ASSESSMENT AND PLAN: Vitamin D deficiency - Plan: Vitamin D, Ergocalciferol, (DRISDOL) 50000 units CAPS capsule  Prediabetes  Other fatigue  Shortness of breath on exertion  At risk for osteoporosis  Class 1 obesity with serious comorbidity and body mass index (BMI) of 31.0 to 31.9 in adult, unspecified obesity type  PLAN:  Vitamin  D Deficiency Aubriauna was informed that low vitamin D levels contributes to fatigue and are associated with obesity, breast, and colon cancer. She agrees to continue to take prescription Vit D @50 ,000 IU every week #4 with no refills and will follow up for routine testing of vitamin D, at least 2-3 times per year. She was informed of the risk of over-replacement of vitamin D and agrees to not increase her dose unless she discusses this with [place] first. Aliyanah agrees to follow up as directed.  At risk for osteopenia and osteoporosis Quinne is at risk for osteopenia and osteoporosis due to her vitamin D deficiency. She was encouraged to take her vitamin D and follow her higher calcium diet and increase strengthening exercise to help strengthen her bones and decrease her risk of osteopenia and osteoporosis.  Pre-Diabetes Kaesha will continue to work on weight loss, exercise, and decreasing simple carbohydrates in her diet to help decrease the risk of diabetes. We dicussed metformin including benefits and risks. She was informed that eating too many simple carbohydrates or too many calories at one sitting increases the likelihood of GI side effects. Kytzia will continue metformin for now and a prescription was not written today. We will repeat labs at the next appointment. Margaretha agreed to follow up with [place] as directed to monitor her progress.  Fatigue Muna has agreed to  work on diet, exercise and weight loss to help with fatigue. Proper sleep hygiene was discussed including the need for 7-8 hours of quality sleep each night. Indirect Calorimetry was repeated today.  Dyspnea with exercise Farra's shortness of breath appears to be obesity related and exercise induced. The indirect calorimeter results showed VO2 of 326 and a REE of 2267. She has agreed to work on weight loss and gradually increase exercise to treat her exercise induced shortness of breath.   Obesity Shanyah is currently in the action stage of change. As such, her goal is to continue with weight loss efforts She has agreed to keep a food journal with 1700 to 1800 calories and 100+ grams of protein  daily Mirra has been instructed to work up to a goal of 150 minutes of combined cardio and strengthening exercise per week for weight loss and overall health benefits. We discussed the following Behavioral Modification Strategies today: planning for success, increasing lean protein intake and work on meal planning and easy cooking plans  Lillymae has agreed to follow up with our clinic in 2 weeks. She was informed of the importance of frequent follow up visits to maximize her success with intensive lifestyle modifications for her multiple health conditions.   OBESITY BEHAVIORAL INTERVENTION VISIT  Today's visit was # 12 out of 22.  Starting weight: 197 lbs Starting date: 09/06/17 Today's weight : 200 lbs  Today's date: 04/23/2018 Total lbs lost to date: 0    ASK: We discussed the diagnosis of obesity with Aavya Meunier today and Misk agreed to give [place] permission to discuss obesity behavioral modification therapy today.  ASSESS: Virga has the diagnosis of obesity and her BMI today is 31.32 Darcelle is in the action stage of change   ADVISE: Deitra was educated on the multiple health risks of obesity as well as the benefit of weight loss to improve her health. She was advised of the need  for long term treatment and the importance of lifestyle modifications.  AGREE: Multiple dietary modification options and treatment options were discussed and  Tahni agreed to the above obesity treatment plan.  I, Doreene Nest, am acting as transcriptionist for Eber Jones, MD  I have reviewed the above documentation for accuracy and completeness, and I agree with the above. - Ilene Qua, MD

## 2018-04-24 ENCOUNTER — Telehealth (INDEPENDENT_AMBULATORY_CARE_PROVIDER_SITE_OTHER): Payer: Self-pay | Admitting: Family Medicine

## 2018-04-24 ENCOUNTER — Encounter (INDEPENDENT_AMBULATORY_CARE_PROVIDER_SITE_OTHER): Payer: Self-pay

## 2018-04-24 NOTE — Telephone Encounter (Signed)
Sent the patient a my chart message. Caitlin Stark, [place]

## 2018-04-24 NOTE — Telephone Encounter (Signed)
Caitlin Stark wants to know if she should reduce her calorie intake on days that she does not go to the gym.  She also states that she thinks she has eaten most of her calories today and wants some instruction on what to do about that.  Please advise.  Thank you.

## 2018-04-24 NOTE — Telephone Encounter (Signed)
Patient presented to the office.  She stated that when she was seen yesterday that Dr. Adair Patter changed her calorie intake to 1700-1800 a day due to her exercising.  She wants to know that on the days she doesn't exercise, does she continue to eat 1700-1800 calories a day or reduce to 1400 a day.  [phone]

## 2018-05-21 ENCOUNTER — Ambulatory Visit (INDEPENDENT_AMBULATORY_CARE_PROVIDER_SITE_OTHER): Payer: BC Managed Care – PPO | Admitting: Family Medicine

## 2018-05-21 VITALS — BP 123/78 | HR 60 | Temp 97.4°F | Ht 67.0 in | Wt 197.0 lb

## 2018-05-21 DIAGNOSIS — R7303 Prediabetes: Secondary | ICD-10-CM

## 2018-05-21 DIAGNOSIS — E559 Vitamin D deficiency, unspecified: Secondary | ICD-10-CM

## 2018-05-21 DIAGNOSIS — Z6831 Body mass index (BMI) 31.0-31.9, adult: Secondary | ICD-10-CM

## 2018-05-21 DIAGNOSIS — E669 Obesity, unspecified: Secondary | ICD-10-CM | POA: Diagnosis not present

## 2018-05-21 DIAGNOSIS — Z9189 Other specified personal risk factors, not elsewhere classified: Secondary | ICD-10-CM

## 2018-05-21 MED ORDER — VITAMIN D (ERGOCALCIFEROL) 1.25 MG (50000 UNIT) PO CAPS: 50000.0000 [IU] | ORAL_CAPSULE | ORAL | 0 refills | Status: DC

## 2018-05-22 LAB — VITAMIN D 25 HYDROXY (VIT D DEFICIENCY, FRACTURES): Vit D, 25-Hydroxy: 51.8 ng/mL (ref 30.0–100.0)

## 2018-05-22 LAB — VITAMIN B12: Vitamin B-12: 687 pg/mL (ref 232–1245)

## 2018-05-22 LAB — INSULIN, RANDOM: INSULIN: 4.5 u[IU]/mL (ref 2.6–24.9)

## 2018-05-22 LAB — HEMOGLOBIN A1C
ESTIMATED AVERAGE GLUCOSE: 111 mg/dL
Hgb A1c MFr Bld: 5.5 % (ref 4.8–5.6)

## 2018-05-22 NOTE — Progress Notes (Signed)
Office: [phone]  /  Fax: [phone]   HPI:   Chief Complaint: OBESITY Caitlin Stark is here to discuss her progress with her obesity treatment plan. She is keeping a food journal with 1700 to 1800 calories and 100+ grams of protein and is following her eating plan approximately 100 % of the time. She states she is doing [employer] for 60 minutes 3 times per week. Caitlin Stark is hitting her protein goal daily. She is decreasing her carbs to 100 grams per day. [name] is enjoying [employer] 3 times a week.  Her weight is 197 lb (89.4 kg) today and has had a weight loss of 3 pounds over a period of 3 weeks since her last visit. She has lost 0 lbs since starting treatment with [place].  Vitamin D deficiency Caitlin Stark has a diagnosis of vitamin D deficiency. She is currently taking vit D. She admits fatigue and denies nausea, vomiting or muscle weakness.  Pre-Diabetes Caitlin Stark has a diagnosis of pre-diabetes based on her elevated Hgb A1c and was informed this puts her at greater risk of developing diabetes. She is taking metformin currently and is not having GI side effects. She continues to work on diet and exercise to decrease risk of diabetes. She admits to having carb cravings.  At risk for diabetes Caitlin Stark is at higher than average risk for developing diabetes due to her pre-diabetes and obesity.   ALLERGIES: Allergies  Allergen Reactions  . Amoxicillin     REACTION: eyes swelling  . Lisinopril     REACTION: swelling of eyes and lower lip  . Morphine And Related Hives    MEDICATIONS: Current Outpatient Medications on File Prior to Visit  Medication Sig Dispense Refill  . diltiazem (DILACOR XR) 180 MG 24 hr capsule Take 180 mg by mouth daily.      . metFORMIN (GLUCOPHAGE) 500 MG tablet TAKE  (1)  TABLET TWICE A DAY WITH MEALS (BREAKFAST AND SUPPER) 60 tablet 0  . spironolactone (ALDACTONE) 25 MG tablet Take 1 tablet (25 mg total) by mouth 2 (two) times daily.     No current  facility-administered medications on file prior to visit.     PAST MEDICAL HISTORY: Past Medical History:  Diagnosis Date  . Abnormal Pap smear 1993  . Anemia   . Breast mass, right   . Fibroid   . Hirsutism   . Hypertension   . Menorrhagia   . Vitamin D deficiency     PAST SURGICAL HISTORY: Past Surgical History:  Procedure Laterality Date  . ABDOMINOPLASTY    . CESAREAN SECTION    . Diastasis Repair  2010  . HYSTEROSCOPY W/D&C    . LIPOSUCTION    . TUBAL LIGATION      SOCIAL HISTORY: Social History   Tobacco Use  . Smoking status: Never Smoker  . Smokeless tobacco: Never Used  Substance Use Topics  . Alcohol use: No  . Drug use: No    FAMILY HISTORY: Family History  Problem Relation Age of Onset  . Hypertension Other   . Anemia Other   . Autoimmune disease Other   . Diabetes Mother   . Hypertension Mother   . Hyperlipidemia Mother   . Obesity Mother   . Diabetes Father   . Hyperlipidemia Father   . Hypertension Father   . Sudden death Father   . Obesity Father     ROS: Review of Systems  Constitutional: Positive for malaise/fatigue and weight loss.  Gastrointestinal: Negative for nausea  and vomiting.  Genitourinary:       Negative for polyuria.  Musculoskeletal:       Negative for muscle weakness.  Endo/Heme/Allergies:       Positive for carb cravings.    PHYSICAL EXAM: Blood pressure 123/78, pulse 60, temperature (!) 97.4 F (36.3 C), temperature source Oral, height 5\' 7"  (1.702 m), weight 197 lb (89.4 kg), last menstrual period 09/10/2012, SpO2 98 %. Body mass index is 30.85 kg/m. Physical Exam  Constitutional: She is oriented to person, place, and time. She appears well-developed and well-nourished.  Cardiovascular: Normal rate.  Pulmonary/Chest: Effort normal.  Musculoskeletal: Normal range of motion.  Neurological: She is oriented to person, place, and time.  Skin: Skin is warm and dry.  Psychiatric: She has a normal mood and  affect. Her behavior is normal.  Vitals reviewed.   RECENT LABS AND TESTS: BMET    Component Value Date/Time   NA 142 01/02/2018 0957   K 4.6 01/02/2018 0957   CL 101 01/02/2018 0957   CO2 22 01/02/2018 0957   GLUCOSE 85 01/02/2018 0957   BUN 12 01/02/2018 0957   CREATININE 1.00 01/02/2018 0957   CALCIUM 10.1 01/02/2018 0957   GFRNONAA 67 01/02/2018 0957   GFRAA 78 01/02/2018 0957   Lab Results  Component Value Date   HGBA1C 5.5 05/21/2018   HGBA1C 5.7 (H) 01/02/2018   HGBA1C 5.9 (H) 09/06/2017   Lab Results  Component Value Date   INSULIN 4.5 05/21/2018   INSULIN 10.1 01/02/2018   INSULIN 7.0 09/06/2017   CBC    Component Value Date/Time   WBC 8.2 09/06/2017 0939   WBC 5.0 08/12/2011 1508   RBC 4.60 09/06/2017 0939   RBC 3.84 (L) 08/12/2011 1508   HGB 13.6 09/06/2017 0939   HCT 40.3 09/06/2017 0939   PLT 244 08/12/2011 1508   MCV 88 09/06/2017 0939   MCH 29.6 09/06/2017 0939   MCH 28.9 08/12/2011 1508   MCHC 33.7 09/06/2017 0939   MCHC 33.3 08/12/2011 1508   RDW 14.9 09/06/2017 0939   LYMPHSABS 2.4 09/06/2017 0939   MONOABS 0.5 08/12/2011 1508   EOSABS 0.1 09/06/2017 0939   BASOSABS 0.1 09/06/2017 0939   Iron/TIBC/Ferritin/ %Sat No results found for: IRON, TIBC, FERRITIN, IRONPCTSAT Lipid Panel     Component Value Date/Time   CHOL 171 09/06/2017 0939   TRIG 69 09/06/2017 0939   HDL 59 09/06/2017 0939   LDLCALC 98 09/06/2017 0939   Hepatic Function Panel     Component Value Date/Time   PROT 7.7 01/02/2018 0957   ALBUMIN 4.6 01/02/2018 0957   AST 16 01/02/2018 0957   ALT 14 01/02/2018 0957   ALKPHOS 112 01/02/2018 0957   BILITOT 0.4 01/02/2018 0957      Component Value Date/Time   TSH 2.190 09/06/2017 0939   Results for MANHATTAN, MCCUEN (MRN [id number]) as of 05/22/2018 14:34  Ref. Range 05/21/2018 08:22  Vitamin D, 25-Hydroxy Latest Ref Range: 30.0 - 100.0 ng/mL 51.8   ASSESSMENT AND PLAN: Vitamin D deficiency - Plan: VITAMIN D 25  Hydroxy (Vit-D Deficiency, Fractures), Vitamin B12, Vitamin D, Ergocalciferol, (DRISDOL) 50000 units CAPS capsule  Prediabetes - Plan: Hemoglobin A1c, Insulin, random  At risk for diabetes mellitus  Class 1 obesity with serious comorbidity and body mass index (BMI) of 31.0 to 31.9 in adult, unspecified obesity type  PLAN:  Vitamin D Deficiency Sukanya was informed that low vitamin D levels contributes to fatigue and are associated with obesity, breast, and  colon cancer. She agrees to continue to take prescription Vit D @50 ,000 IU every week #4 with no refills and will follow up for routine testing of vitamin D, at least 2-3 times per year. She was informed of the risk of over-replacement of vitamin D and agrees to not increase her dose unless she discusses this with [place] first. We will check a vitamin D level today and she will follow up in 4 weeks.  Pre-Diabetes Porsche will continue to work on weight loss, exercise, and decreasing simple carbohydrates in her diet to help decrease the risk of diabetes.  She was informed that eating too many simple carbohydrates or too many calories at one sitting increases the likelihood of GI side effects. Kmya agrees to continue taking metformin and a prescription was not written today. We will draw a Hgb A1c, Insulin and a B12 today. Arleta agreed to follow up with [place] as directed to monitor her progress.  Diabetes risk counseling Lekia was given extended (15 minutes) diabetes prevention counseling today. She is 47 y.o. female and has risk factors for diabetes including pre-diabetes and obesity. We discussed intensive lifestyle modifications today with an emphasis on weight loss as well as increasing exercise and decreasing simple carbohydrates in her diet.  Obesity Damani is currently in the action stage of change. As such, her goal is to continue with weight loss efforts. She has agreed to keep a food journal with 1700 to 1800 calories and 100+ grams of  protein.  Junita has been instructed to work up to a goal of 150 minutes of combined cardio and strengthening exercise per week for weight loss and overall health benefits. We discussed the following Behavioral Modification Strategies today: increasing lean protein intake, increasing vegetables, work on meal planning and easy cooking plans, planning for success, and keep a strict food journal.  Kelci has agreed to follow up with our clinic in 4 weeks. She was informed of the importance of frequent follow up visits to maximize her success with intensive lifestyle modifications for her multiple health conditions.   OBESITY BEHAVIORAL INTERVENTION VISIT  Today's visit was # 13   Starting weight: 197 lbs Starting date: 09/06/17 Today's weight : Weight: 197 lb (89.4 kg)  Today's date: 05/21/2018 Total lbs lost to date: 0  ASK: We discussed the diagnosis of obesity with Jazalyn Lanphear today and Velvia agreed to give [place] permission to discuss obesity behavioral modification therapy today.  ASSESS: Fadumo has the diagnosis of obesity and her BMI today is 30.85. Basilia is in the action stage of change.   ADVISE: Yancy was educated on the multiple health risks of obesity as well as the benefit of weight loss to improve her health. She was advised of the need for long term treatment and the importance of lifestyle modifications to improve her current health and to decrease her risk of future health problems.  AGREE: Multiple dietary modification options and treatment options were discussed and Jovan agreed to follow the recommendations documented in the above note.  ARRANGE: Dilia was educated on the importance of frequent visits to treat obesity as outlined per CMS and USPSTF guidelines and agreed to schedule her next follow up appointment today.  I, Marcille Blanco, am acting as Location manager for Eber Jones, MD  I have reviewed the above documentation for accuracy and  completeness, and I agree with the above. - Ilene Qua, MD

## 2018-06-18 ENCOUNTER — Ambulatory Visit (INDEPENDENT_AMBULATORY_CARE_PROVIDER_SITE_OTHER): Payer: BC Managed Care – PPO | Admitting: Family Medicine

## 2018-06-18 VITALS — BP 142/83 | HR 68 | Temp 98.4°F | Ht 67.0 in | Wt 198.0 lb

## 2018-06-18 DIAGNOSIS — E669 Obesity, unspecified: Secondary | ICD-10-CM

## 2018-06-18 DIAGNOSIS — R7303 Prediabetes: Secondary | ICD-10-CM | POA: Diagnosis not present

## 2018-06-18 DIAGNOSIS — Z6831 Body mass index (BMI) 31.0-31.9, adult: Secondary | ICD-10-CM | POA: Diagnosis not present

## 2018-06-18 DIAGNOSIS — I10 Essential (primary) hypertension: Secondary | ICD-10-CM

## 2018-06-20 NOTE — Progress Notes (Signed)
Office: [phone]  /  Fax: [phone]   HPI:   Chief Complaint: OBESITY Caitlin Stark is here to discuss her progress with her obesity treatment plan. She is keeping a food journal with 1800 calories and 100+ grams of protein and is following her eating plan approximately 100 % of the time. She states she is doing [employer] for 60 minutes 3 times per week.Tiffany normally has approximately 300 calories left a day. She is watching her carbohydrate intake to 100 grams per day.  Her weight is 198 lb (89.8 kg) today and has had a weight gain of 1 pound since her last visit. She has lost 0 lbs since starting treatment with [place].  Hypertension Caitlin Stark is a 47 y.o. female with hypertension. She is working on weight loss to help control her blood pressure with the goal of decreasing her risk of heart attack and stroke. Maxcine's blood pressure is slightly elevated. Kylah denies chest pain, chest pressure, or headaches.  Pre-Diabetes Caitlin Stark has a diagnosis of pre-diabetes based on her elevated Hgb A1c and was informed this puts her at greater risk of developing diabetes. She is taking metformin currently and continues to work on diet and exercise to decrease risk of diabetes. She denies GI effects or hunger.  ALLERGIES: Allergies  Allergen Reactions  . Amoxicillin     REACTION: eyes swelling  . Lisinopril     REACTION: swelling of eyes and lower lip  . Morphine And Related Hives    MEDICATIONS: Current Outpatient Medications on File Prior to Visit  Medication Sig Dispense Refill  . diltiazem (DILACOR XR) 180 MG 24 hr capsule Take 180 mg by mouth daily.      . metFORMIN (GLUCOPHAGE) 500 MG tablet TAKE  (1)  TABLET TWICE A DAY WITH MEALS (BREAKFAST AND SUPPER) 60 tablet 0  . spironolactone (ALDACTONE) 25 MG tablet Take 1 tablet (25 mg total) by mouth 2 (two) times daily.    . Vitamin D, Ergocalciferol, (DRISDOL) 50000 units CAPS capsule Take 1 capsule (50,000 Units total) by mouth  every 7 (seven) days. 4 capsule 0   No current facility-administered medications on file prior to visit.     PAST MEDICAL HISTORY: Past Medical History:  Diagnosis Date  . Abnormal Pap smear 1993  . Anemia   . Breast mass, right   . Fibroid   . Hirsutism   . Hypertension   . Menorrhagia   . Vitamin D deficiency     PAST SURGICAL HISTORY: Past Surgical History:  Procedure Laterality Date  . ABDOMINOPLASTY    . CESAREAN SECTION    . Diastasis Repair  2010  . HYSTEROSCOPY W/D&C    . LIPOSUCTION    . TUBAL LIGATION      SOCIAL HISTORY: Social History   Tobacco Use  . Smoking status: Never Smoker  . Smokeless tobacco: Never Used  Substance Use Topics  . Alcohol use: No  . Drug use: No    FAMILY HISTORY: Family History  Problem Relation Age of Onset  . Hypertension Other   . Anemia Other   . Autoimmune disease Other   . Diabetes Mother   . Hypertension Mother   . Hyperlipidemia Mother   . Obesity Mother   . Diabetes Father   . Hyperlipidemia Father   . Hypertension Father   . Sudden death Father   . Obesity Father     ROS: Review of Systems  Constitutional: Negative for weight loss.  Cardiovascular: Negative for chest  pain.       Negative for chest pressure.  Gastrointestinal: Negative for diarrhea, nausea and vomiting.  Neurological: Negative for headaches.    PHYSICAL EXAM: Blood pressure (!) 142/83, pulse 68, temperature 98.4 F (36.9 C), temperature source Oral, height 5\' 7"  (1.702 m), weight 198 lb (89.8 kg), last menstrual period 09/10/2012, SpO2 98 %. Body mass index is 31.01 kg/m. Physical Exam  Constitutional: She is oriented to person, place, and time. She appears well-developed and well-nourished.  Cardiovascular: Normal rate.  Pulmonary/Chest: Effort normal.  Musculoskeletal: Normal range of motion.  Neurological: She is oriented to person, place, and time.  Skin: Skin is warm and dry.  Psychiatric: She has a normal mood and affect.  Her behavior is normal.  Vitals reviewed.   RECENT LABS AND TESTS: BMET    Component Value Date/Time   NA 142 01/02/2018 0957   K 4.6 01/02/2018 0957   CL 101 01/02/2018 0957   CO2 22 01/02/2018 0957   GLUCOSE 85 01/02/2018 0957   BUN 12 01/02/2018 0957   CREATININE 1.00 01/02/2018 0957   CALCIUM 10.1 01/02/2018 0957   GFRNONAA 67 01/02/2018 0957   GFRAA 78 01/02/2018 0957   Lab Results  Component Value Date   HGBA1C 5.5 05/21/2018   HGBA1C 5.7 (H) 01/02/2018   HGBA1C 5.9 (H) 09/06/2017   Lab Results  Component Value Date   INSULIN 4.5 05/21/2018   INSULIN 10.1 01/02/2018   INSULIN 7.0 09/06/2017   CBC    Component Value Date/Time   WBC 8.2 09/06/2017 0939   WBC 5.0 08/12/2011 1508   RBC 4.60 09/06/2017 0939   RBC 3.84 (L) 08/12/2011 1508   HGB 13.6 09/06/2017 0939   HCT 40.3 09/06/2017 0939   PLT 244 08/12/2011 1508   MCV 88 09/06/2017 0939   MCH 29.6 09/06/2017 0939   MCH 28.9 08/12/2011 1508   MCHC 33.7 09/06/2017 0939   MCHC 33.3 08/12/2011 1508   RDW 14.9 09/06/2017 0939   LYMPHSABS 2.4 09/06/2017 0939   MONOABS 0.5 08/12/2011 1508   EOSABS 0.1 09/06/2017 0939   BASOSABS 0.1 09/06/2017 0939   Iron/TIBC/Ferritin/ %Sat No results found for: IRON, TIBC, FERRITIN, IRONPCTSAT Lipid Panel     Component Value Date/Time   CHOL 171 09/06/2017 0939   TRIG 69 09/06/2017 0939   HDL 59 09/06/2017 0939   LDLCALC 98 09/06/2017 0939   Hepatic Function Panel     Component Value Date/Time   PROT 7.7 01/02/2018 0957   ALBUMIN 4.6 01/02/2018 0957   AST 16 01/02/2018 0957   ALT 14 01/02/2018 0957   ALKPHOS 112 01/02/2018 0957   BILITOT 0.4 01/02/2018 0957      Component Value Date/Time   TSH 2.190 09/06/2017 0939   Results for SOLA, MARGOLIS (MRN [id number]) as of 06/20/2018 08:55  Ref. Range 05/21/2018 08:22  Vitamin D, 25-Hydroxy Latest Ref Range: 30.0 - 100.0 ng/mL 51.8   ASSESSMENT AND PLAN: Essential hypertension  Prediabetes  Class 1  obesity with serious comorbidity and body mass index (BMI) of 31.0 to 31.9 in adult, unspecified obesity type  PLAN:  Hypertension We discussed sodium restriction, working on healthy weight loss, and a regular exercise program as the means to achieve improved blood pressure control. Lenette agreed with this plan and agreed to follow up as directed. We will continue to monitor her blood pressure as well as her progress with the above lifestyle modifications. She will continue her Dilitiazem and Spironalactone as prescribed and will watch  for signs of hypotension as she continues her lifestyle modifications.  Pre-Diabetes Arnetha will continue to work on weight loss, exercise, and decreasing simple carbohydrates in her diet to help decrease the risk of diabetes. She was informed that eating too many simple carbohydrates or too many calories at one sitting increases the likelihood of GI side effects. Sala agrees to continue metformin and a prescription was not written today. Yaneliz agreed to follow up with [place] as directed to monitor her progress.  I spent > than 50% of the 15 minute visit on counseling as documented in the note.  Obesity [name] is currently in the action stage of change. As such, her goal is to continue with weight loss efforts. She has agreed to keep a food journal with 1800 calories and 100 grams of protein.  Cuma has been instructed to work up to a goal of 150 minutes of combined cardio and strengthening exercise per week for weight loss and overall health benefits. We discussed the following Behavioral Modification Strategies today: increasing lean protein intake, increasing vegetables, work on meal planning and easy cooking plans, better snacking choices, and planning for success.  Renu has agreed to follow up with our clinic in 4 weeks. She was informed of the importance of frequent follow up visits to maximize her success with intensive lifestyle modifications for her  multiple health conditions.   OBESITY BEHAVIORAL INTERVENTION VISIT  Today's visit was # 14   Starting weight: 197 lbs Starting date: 09/06/17 Today's weight : Weight: 198 lb (89.8 kg)  Today's date: 06/18/2018 Total lbs lost to date: 0  ASK: We discussed the diagnosis of obesity with Jolin Colwell today and Seidy agreed to give [place] permission to discuss obesity behavioral modification therapy today.  ASSESS: Rosy has the diagnosis of obesity and her BMI today is 31.0 Elner is in the action stage of change.   ADVISE: Zamarah was educated on the multiple health risks of obesity as well as the benefit of weight loss to improve her health. She was advised of the need for long term treatment and the importance of lifestyle modifications to improve her current health and to decrease her risk of future health problems.  AGREE: Multiple dietary modification options and treatment options were discussed and Yailen agreed to follow the recommendations documented in the above note.  ARRANGE: Indie was educated on the importance of frequent visits to treat obesity as outlined per CMS and USPSTF guidelines and agreed to schedule her next follow up appointment today.  I, Marcille Blanco, am acting as Location manager for Eber Jones, MD  I have reviewed the above documentation for accuracy and completeness, and I agree with the above. - Ilene Qua, MD

## 2018-06-29 ENCOUNTER — Other Ambulatory Visit (INDEPENDENT_AMBULATORY_CARE_PROVIDER_SITE_OTHER): Payer: Self-pay | Admitting: Family Medicine

## 2018-06-29 DIAGNOSIS — R7303 Prediabetes: Secondary | ICD-10-CM

## 2018-07-17 ENCOUNTER — Ambulatory Visit (INDEPENDENT_AMBULATORY_CARE_PROVIDER_SITE_OTHER): Payer: BC Managed Care – PPO | Admitting: Family Medicine

## 2018-07-17 VITALS — BP 135/73 | HR 69 | Temp 98.0°F | Ht 67.0 in | Wt 199.0 lb

## 2018-07-17 DIAGNOSIS — Z6831 Body mass index (BMI) 31.0-31.9, adult: Secondary | ICD-10-CM

## 2018-07-17 DIAGNOSIS — E669 Obesity, unspecified: Secondary | ICD-10-CM

## 2018-07-17 DIAGNOSIS — R7303 Prediabetes: Secondary | ICD-10-CM | POA: Diagnosis not present

## 2018-07-17 DIAGNOSIS — I10 Essential (primary) hypertension: Secondary | ICD-10-CM | POA: Diagnosis not present

## 2018-07-17 DIAGNOSIS — E66811 Obesity, class 1: Secondary | ICD-10-CM

## 2018-07-18 NOTE — Progress Notes (Signed)
Office: [phone]  /  Fax: [phone]   HPI:   Chief Complaint: OBESITY Caitlin Stark is here to discuss her progress with her obesity treatment plan. She is on the keep a food journal with 1800 calories and 100 grams of protein daily plan and is following her eating plan approximately 90 % of the time. She states she is doing Caitlin Stark 60 minutes 3 times per week. Caitlin Stark voices that she is still running short on calories daily and she is feeling satisfied, but she is still not getting what she needs in terms of 1800 calories. Caitlin Stark is frustrated with the lack of weight loss. She plans to go away for five days after Thanksgiving. Her weight is 199 lb (90.3 kg) today and has had a weight gain of 1 pound over a period of 4 weeks since her last visit. She has gained 2 lbs since starting treatment with Caitlin Stark.  Pre-Diabetes Caitlin Stark has a diagnosis of prediabetes based on her elevated Hgb A1c and was informed this puts her at greater risk of developing diabetes. She is on metformin 500 mg PO every morning and she continues to work on diet and exercise to decrease risk of diabetes. She denies carb cravings.  Hypertension Caitlin Stark is a 47 y.o. female with hypertension. Caitlin Stark denies chest pain, chest pressure or headache. She is working weight loss to help control her blood pressure with the goal of decreasing her risk of heart attack and stroke. Caitlin Stark blood pressure is currently controlled.  ALLERGIES: Allergies  Allergen Reactions  . Amoxicillin     REACTION: eyes swelling  . Lisinopril     REACTION: swelling of eyes and lower lip  . Morphine And Related Hives    MEDICATIONS: Current Outpatient Medications on File Prior to Visit  Medication Sig Dispense Refill  . diltiazem (DILACOR XR) 180 MG 24 hr capsule Take 180 mg by mouth daily.      . metFORMIN (GLUCOPHAGE) 500 MG tablet TAKE  (1)  TABLET TWICE A DAY WITH MEALS (BREAKFAST AND SUPPER) 60 tablet 0  .  spironolactone (ALDACTONE) 25 MG tablet Take 1 tablet (25 mg total) by mouth 2 (two) times daily.    . Vitamin D, Ergocalciferol, (DRISDOL) 50000 units CAPS capsule Take 1 capsule (50,000 Units total) by mouth every 7 (seven) days. 4 capsule 0   No current facility-administered medications on file prior to visit.     PAST MEDICAL HISTORY: Past Medical History:  Diagnosis Date  . Abnormal Pap smear 1993  . Anemia   . Breast mass, right   . Fibroid   . Hirsutism   . Hypertension   . Menorrhagia   . Vitamin D deficiency     PAST SURGICAL HISTORY: Past Surgical History:  Procedure Laterality Date  . ABDOMINOPLASTY    . CESAREAN SECTION    . Diastasis Repair  2010  . HYSTEROSCOPY W/D&C    . LIPOSUCTION    . TUBAL LIGATION      SOCIAL HISTORY: Social History   Tobacco Use  . Smoking status: Never Smoker  . Smokeless tobacco: Never Used  Substance Use Topics  . Alcohol use: No  . Drug use: No    FAMILY HISTORY: Family History  Problem Relation Age of Onset  . Hypertension Other   . Anemia Other   . Autoimmune disease Other   . Diabetes Mother   . Hypertension Mother   . Hyperlipidemia Mother   . Obesity Mother   . Diabetes  Father   . Hyperlipidemia Father   . Hypertension Father   . Sudden death Father   . Obesity Father     ROS: Review of Systems  Constitutional: Negative for weight loss.  Cardiovascular: Negative for chest pain.       Negative for chest pressure  Neurological: Negative for headaches.  Endo/Heme/Allergies:       Negative for carb cravings    PHYSICAL EXAM: Blood pressure 135/73, pulse 69, temperature 98 F (36.7 C), temperature source Oral, height 5\' 7"  (1.702 m), weight 199 lb (90.3 kg), last menstrual period 09/10/2012, SpO2 99 %. Body mass index is 31.17 kg/m. Physical Exam  Constitutional: She is oriented to person, place, and time. She appears well-developed and well-nourished.  Cardiovascular: Normal rate.  Pulmonary/Chest:  Effort normal.  Musculoskeletal: Normal range of motion.  Neurological: She is oriented to person, place, and time.  Skin: Skin is warm and dry.  Psychiatric: She has a normal mood and affect. Her behavior is normal.  Vitals reviewed.   RECENT LABS AND TESTS: BMET    Component Value Date/Time   NA 142 01/02/2018 0957   K 4.6 01/02/2018 0957   CL 101 01/02/2018 0957   CO2 22 01/02/2018 0957   GLUCOSE 85 01/02/2018 0957   BUN 12 01/02/2018 0957   CREATININE 1.00 01/02/2018 0957   CALCIUM 10.1 01/02/2018 0957   GFRNONAA 67 01/02/2018 0957   GFRAA 78 01/02/2018 0957   Lab Results  Component Value Date   HGBA1C 5.5 05/21/2018   HGBA1C 5.7 (H) 01/02/2018   HGBA1C 5.9 (H) 09/06/2017   Lab Results  Component Value Date   INSULIN 4.5 05/21/2018   INSULIN 10.1 01/02/2018   INSULIN 7.0 09/06/2017   CBC    Component Value Date/Time   WBC 8.2 09/06/2017 0939   WBC 5.0 08/12/2011 1508   RBC 4.60 09/06/2017 0939   RBC 3.84 (L) 08/12/2011 1508   HGB 13.6 09/06/2017 0939   HCT 40.3 09/06/2017 0939   PLT 244 08/12/2011 1508   MCV 88 09/06/2017 0939   MCH 29.6 09/06/2017 0939   MCH 28.9 08/12/2011 1508   MCHC 33.7 09/06/2017 0939   MCHC 33.3 08/12/2011 1508   RDW 14.9 09/06/2017 0939   LYMPHSABS 2.4 09/06/2017 0939   MONOABS 0.5 08/12/2011 1508   EOSABS 0.1 09/06/2017 0939   BASOSABS 0.1 09/06/2017 0939   Iron/TIBC/Ferritin/ %Sat No results found for: IRON, TIBC, FERRITIN, IRONPCTSAT Lipid Panel     Component Value Date/Time   CHOL 171 09/06/2017 0939   TRIG 69 09/06/2017 0939   HDL 59 09/06/2017 0939   LDLCALC 98 09/06/2017 0939   Hepatic Function Panel     Component Value Date/Time   PROT 7.7 01/02/2018 0957   ALBUMIN 4.6 01/02/2018 0957   AST 16 01/02/2018 0957   ALT 14 01/02/2018 0957   ALKPHOS 112 01/02/2018 0957   BILITOT 0.4 01/02/2018 0957      Component Value Date/Time   TSH 2.190 09/06/2017 0939   Results for TAHJA, LIAO (MRN [id number]) as  of 07/18/2018 12:59  Ref. Range 05/21/2018 08:22  Vitamin D, 25-Hydroxy Latest Ref Range: 30.0 - 100.0 ng/mL 51.8   ASSESSMENT AND PLAN: Prediabetes  Essential hypertension  Class 1 obesity with serious comorbidity and body mass index (BMI) of 31.0 to 31.9 in adult, unspecified obesity type  PLAN:  Pre-Diabetes Caitlin Stark will continue to work on weight loss, exercise, and decreasing simple carbohydrates in her diet to help decrease the risk  of diabetes. We dicussed metformin including benefits and risks. She was informed that eating too many simple carbohydrates or too many calories at one sitting increases the likelihood of GI side effects. Caitlin Stark will continue metformin for now and a prescription was not written today. Caitlin Stark agreed to follow up with Caitlin Stark as directed to monitor her progress.  Hypertension We discussed sodium restriction, working on healthy weight loss, and a regular exercise program as the means to achieve improved blood pressure control. Caitlin Stark agreed with this plan and agreed to follow up as directed. We will continue to monitor her blood pressure as well as her progress with the above lifestyle modifications. She will continue her current medications as prescribed and will watch for signs of hypotension as she continues her lifestyle modifications.  I spent > than 50% of the 15 minute visit on counseling as documented in the note.  Obesity Caitlin Stark is currently in the action stage of change. As such, her goal is to continue with weight loss efforts She has agreed to keep a food journal with 1800 calories and 100+ grams of protein daily Caitlin Stark has been instructed to work up to a goal of 150 minutes of combined cardio and strengthening exercise per week for weight loss and overall health benefits. We discussed the following Behavioral Modification Strategies today: better snacking choices, planning for success, increasing lean protein intake, increasing vegetables and work on  meal planning and easy cooking plans  Caitlin Stark has agreed to follow up with our clinic in 4 weeks. She was informed of the importance of frequent follow up visits to maximize her success with intensive lifestyle modifications for her multiple health conditions.   OBESITY BEHAVIORAL INTERVENTION VISIT  Today's visit was # 15   Starting weight: 197 lbs Starting date: 09/06/2017 Today's weight : 199 lbs Today's date: 07/17/2018 Total lbs lost to date: 0   ASK: We discussed the diagnosis of obesity with Caitlin Stark today and Caitlin Stark agreed to give Caitlin Stark permission to discuss obesity behavioral modification therapy today.  ASSESS: Caitlin Stark has the diagnosis of obesity and her BMI today is 31.16 Caitlin Stark is in the action stage of change   ADVISE: Caitlin Stark was educated on the multiple health risks of obesity as well as the benefit of weight loss to improve her health. She was advised of the need for long term treatment and the importance of lifestyle modifications to improve her current health and to decrease her risk of future health problems.  AGREE: Multiple dietary modification options and treatment options were discussed and  Caitlin Stark agreed to follow the recommendations documented in the above note.  ARRANGE: Caitlin Stark was educated on the importance of frequent visits to treat obesity as outlined per CMS and USPSTF guidelines and agreed to schedule her next follow up appointment today.  I, Doreene Nest, am acting as transcriptionist for Eber Jones, MD  I have reviewed the above documentation for accuracy and completeness, and I agree with the above. - Ilene Qua, MD

## 2018-08-21 ENCOUNTER — Ambulatory Visit (INDEPENDENT_AMBULATORY_CARE_PROVIDER_SITE_OTHER): Payer: BC Managed Care – PPO | Admitting: Family Medicine

## 2018-09-03 ENCOUNTER — Other Ambulatory Visit (INDEPENDENT_AMBULATORY_CARE_PROVIDER_SITE_OTHER): Payer: Self-pay | Admitting: Family Medicine

## 2018-09-03 DIAGNOSIS — R7303 Prediabetes: Secondary | ICD-10-CM

## 2018-09-06 ENCOUNTER — Other Ambulatory Visit (INDEPENDENT_AMBULATORY_CARE_PROVIDER_SITE_OTHER): Payer: Self-pay | Admitting: Family Medicine

## 2018-09-06 ENCOUNTER — Encounter (INDEPENDENT_AMBULATORY_CARE_PROVIDER_SITE_OTHER): Payer: Self-pay | Admitting: Family Medicine

## 2018-09-06 DIAGNOSIS — E559 Vitamin D deficiency, unspecified: Secondary | ICD-10-CM

## 2018-09-06 DIAGNOSIS — R7303 Prediabetes: Secondary | ICD-10-CM

## 2018-09-07 ENCOUNTER — Other Ambulatory Visit (INDEPENDENT_AMBULATORY_CARE_PROVIDER_SITE_OTHER): Payer: Self-pay | Admitting: Family Medicine

## 2018-09-07 DIAGNOSIS — R7303 Prediabetes: Secondary | ICD-10-CM

## 2018-09-10 ENCOUNTER — Telehealth (INDEPENDENT_AMBULATORY_CARE_PROVIDER_SITE_OTHER): Payer: Self-pay | Admitting: Family Medicine

## 2018-09-10 ENCOUNTER — Other Ambulatory Visit (INDEPENDENT_AMBULATORY_CARE_PROVIDER_SITE_OTHER): Payer: Self-pay

## 2018-09-10 DIAGNOSIS — E559 Vitamin D deficiency, unspecified: Secondary | ICD-10-CM

## 2018-09-10 DIAGNOSIS — R7303 Prediabetes: Secondary | ICD-10-CM

## 2018-09-10 MED ORDER — METFORMIN HCL 500 MG PO TABS: ORAL_TABLET | ORAL | 0 refills | Status: DC

## 2018-09-10 MED ORDER — VITAMIN D (ERGOCALCIFEROL) 1.25 MG (50000 UNIT) PO CAPS: 50000.0000 [IU] | ORAL_CAPSULE | ORAL | 0 refills | Status: DC

## 2018-09-10 NOTE — Telephone Encounter (Signed)
Pt needs refills on Metformin and Vitamin D called to [facility]; appt scheduled for 09/25/2018. drh

## 2018-09-10 NOTE — Telephone Encounter (Signed)
Prescriptions sent to her pharmacy. [place], [place]

## 2018-09-25 ENCOUNTER — Ambulatory Visit (INDEPENDENT_AMBULATORY_CARE_PROVIDER_SITE_OTHER): Payer: BC Managed Care – PPO | Admitting: Family Medicine

## 2018-09-25 ENCOUNTER — Encounter (INDEPENDENT_AMBULATORY_CARE_PROVIDER_SITE_OTHER): Payer: Self-pay

## 2018-10-04 ENCOUNTER — Other Ambulatory Visit (INDEPENDENT_AMBULATORY_CARE_PROVIDER_SITE_OTHER): Payer: Self-pay | Admitting: Family Medicine

## 2018-10-04 DIAGNOSIS — R7303 Prediabetes: Secondary | ICD-10-CM

## 2018-12-16 ENCOUNTER — Emergency Department (HOSPITAL_COMMUNITY)
Admission: EM | Admit: 2018-12-16 | Discharge: 2018-12-16 | Disposition: A | Discharge status: Home / Self Care | Payer: BC Managed Care – PPO | Attending: Emergency Medicine | Admitting: Emergency Medicine

## 2018-12-16 ENCOUNTER — Other Ambulatory Visit: Payer: Self-pay

## 2018-12-16 DIAGNOSIS — Z79899 Other long term (current) drug therapy: Secondary | ICD-10-CM | POA: Insufficient documentation

## 2018-12-16 DIAGNOSIS — I10 Essential (primary) hypertension: Secondary | ICD-10-CM | POA: Insufficient documentation

## 2018-12-16 DIAGNOSIS — L509 Urticaria, unspecified: Secondary | ICD-10-CM | POA: Diagnosis present

## 2018-12-16 LAB — CBG MONITORING, ED: Glucose-Capillary: 118 mg/dL — ABNORMAL HIGH (ref 70–99)

## 2018-12-16 MED ORDER — PREDNISONE 50 MG PO TABS: ORAL_TABLET | ORAL | 0 refills | Status: DC

## 2018-12-16 MED ORDER — FAMOTIDINE 20 MG PO TABS: 20.0000 mg | ORAL_TABLET | Freq: Two times a day (BID) | ORAL | 0 refills | Status: DC

## 2018-12-16 MED ORDER — EPINEPHRINE 0.3 MG/0.3ML IJ SOAJ: 0.3000 mg | Freq: Once | INTRAMUSCULAR | 2 refills | Status: AC

## 2018-12-16 NOTE — Discharge Instructions (Addendum)
Avoid cats.  Use epinephrine pen if you have severe trouble breathing or feel like your throat is closing up.  Follow-up with the allergist of your choice

## 2018-12-16 NOTE — ED Notes (Signed)
ED Provider at bedside. 

## 2018-12-16 NOTE — ED Provider Notes (Signed)
[facility] DEPT Provider Note   CSN: [id number] Arrival date & time: 12/16/18  1340    History   Chief Complaint Chief Complaint  Patient presents with  . Urticaria    HPI Caitlin Stark is a 48 y.o. female.     48 year old female who presents with hives times several days.  Recently started taking care of a stray cat and does have a known allergic reaction to cats.  Has been using antihistamines without relief.  Feels fullness in her throat but denies any trouble swallowing.  Denies being dyspneic.  Hives have been throughout her body and have been pruritic.  No prior history of same denies any new chemicals.     Past Medical History:  Diagnosis Date  . Abnormal Pap smear 1993  . Anemia   . Breast mass, right   . Fibroid   . Hirsutism   . Hypertension   . Menorrhagia   . Vitamin D deficiency     Patient Active Problem List   Diagnosis Date Noted  . Vitamin D deficiency 09/19/2017  . Prediabetes 09/19/2017  . Elevated LFTs 09/19/2017  . Essential hypertension 09/19/2017  . Class 1 obesity with serious comorbidity and body mass index (BMI) of 30.0 to 30.9 in adult 09/19/2017  . HYPERTENSION 05/13/2009    Past Surgical History:  Procedure Laterality Date  . ABDOMINOPLASTY    . CESAREAN SECTION    . Diastasis Repair  2010  . HYSTEROSCOPY W/D&C    . LIPOSUCTION    . TUBAL LIGATION       OB History    Gravida  2   Para  2   Term      Preterm      AB      Living  2     SAB      TAB      Ectopic      Multiple      Live Births               Home Medications    Prior to Admission medications   Medication Sig Start Date End Date Taking? Authorizing Provider  diltiazem (DILACOR XR) 180 MG 24 hr capsule Take 180 mg by mouth daily.      [provider]  metFORMIN (GLUCOPHAGE) 500 MG tablet TAKE  (1)  TABLET TWICE A DAY WITH MEALS (BREAKFAST AND SUPPER) 09/10/18   Eber Jones, MD   spironolactone (ALDACTONE) 25 MG tablet Take 1 tablet (25 mg total) by mouth 2 (two) times daily. 10/26/17   Waldon Merl, PA-C  Vitamin D, Ergocalciferol, (DRISDOL) 1.25 MG (50000 UT) CAPS capsule Take 1 capsule (50,000 Units total) by mouth every 7 (seven) days. 09/10/18   Eber Jones, MD    Family History Family History  Problem Relation Age of Onset  . Hypertension Other   . Anemia Other   . Autoimmune disease Other   . Diabetes Mother   . Hypertension Mother   . Hyperlipidemia Mother   . Obesity Mother   . Diabetes Father   . Hyperlipidemia Father   . Hypertension Father   . Sudden death Father   . Obesity Father     Social History Social History   Tobacco Use  . Smoking status: Never Smoker  . Smokeless tobacco: Never Used  Substance Use Topics  . Alcohol use: No  . Drug use: No     Allergies   Amoxicillin; Lisinopril; and Morphine  and related   Review of Systems Review of Systems  All other systems reviewed and are negative.    Physical Exam Updated Vital Signs BP (!) 147/82   Temp 98.5 F (36.9 C) (Oral)   Resp 18   LMP 09/10/2012   SpO2 99%   Physical Exam Vitals signs and nursing note reviewed.  Constitutional:      General: She is not in acute distress.    Appearance: Normal appearance. She is well-developed. She is not toxic-appearing.  HENT:     Head: Normocephalic and atraumatic.  Eyes:     General: Lids are normal.     Conjunctiva/sclera: Conjunctivae normal.     Pupils: Pupils are equal, round, and reactive to light.  Neck:     Musculoskeletal: Normal range of motion and neck supple.     Thyroid: No thyroid mass.     Trachea: No tracheal deviation.  Cardiovascular:     Rate and Rhythm: Normal rate and regular rhythm.     Heart sounds: Normal heart sounds. No murmur. No gallop.   Pulmonary:     Effort: Pulmonary effort is normal. No respiratory distress.     Breath sounds: Normal breath sounds. No stridor. No decreased  breath sounds, wheezing, rhonchi or rales.  Abdominal:     General: Bowel sounds are normal. There is no distension.     Palpations: Abdomen is soft.     Tenderness: There is no abdominal tenderness. There is no rebound.  Musculoskeletal: Normal range of motion.        General: No tenderness.  Skin:    General: Skin is warm and dry.     Findings: Rash present. Rash is urticarial.  Neurological:     Mental Status: She is alert and oriented to person, place, and time.     GCS: GCS eye subscore is 4. GCS verbal subscore is 5. GCS motor subscore is 6.     Cranial Nerves: No cranial nerve deficit.     Sensory: No sensory deficit.  Psychiatric:        Speech: Speech normal.        Behavior: Behavior normal.      ED Treatments / Results  Labs (all labs ordered are listed, but only abnormal results are displayed) Labs Reviewed  CBG MONITORING, ED - Abnormal; Notable for the following components:      Result Value   Glucose-Capillary 118 (*)    All other components within normal limits    EKG None  Radiology No results found.  Procedures Procedures (including critical care time)  Medications Ordered in ED Medications - No data to display   Initial Impression / Assessment and Plan / ED Course  I have reviewed the triage vital signs and the nursing notes.  Pertinent labs & imaging results that were available during my care of the patient were reviewed by me and considered in my medical decision making (see chart for details).        Patient has a history of diabetes and blood sugar here is 118.  Will place on short course of prednisone and patient will take Benadryl and she will remove the cat allergen  Final Clinical Impressions(s) / ED Diagnoses   Final diagnoses:  None    ED Discharge Orders    None       [name] Leigh, MD 12/16/18 1406

## 2018-12-24 ENCOUNTER — Other Ambulatory Visit: Payer: Self-pay

## 2018-12-24 ENCOUNTER — Ambulatory Visit: Payer: BC Managed Care – PPO | Admitting: Allergy

## 2018-12-24 ENCOUNTER — Encounter: Payer: Self-pay | Admitting: Allergy

## 2018-12-24 VITALS — BP 112/64 | HR 68 | Temp 97.7°F | Resp 16 | Ht 67.0 in | Wt 202.4 lb

## 2018-12-24 DIAGNOSIS — T7840XD Allergy, unspecified, subsequent encounter: Secondary | ICD-10-CM

## 2018-12-24 DIAGNOSIS — R21 Rash and other nonspecific skin eruption: Secondary | ICD-10-CM

## 2018-12-24 DIAGNOSIS — J3089 Other allergic rhinitis: Secondary | ICD-10-CM | POA: Diagnosis not present

## 2018-12-24 NOTE — Patient Instructions (Addendum)
Today's skin testing was positive to grass, weed, ragweed, tree, mold, dust mites. Negative to cat and dog and [place] nuts.   Keep track of reactions. Take pictures. May use over the counter antihistamines such as Zyrtec (cetirizine) or Xyzal (levocetirizine) daily as needed.   Follow up in 2 months.  Skin care recommendations  Bath time: . Always use lukewarm water. AVOID very hot or cold water. Marland Kitchen Keep bathing time to 5-10 minutes. . Do NOT use bubble bath. . Use a mild soap and use just enough to wash the dirty areas. . Do NOT scrub skin vigorously.  . After bathing, pat dry your skin with a towel. Do NOT rub or scrub the skin.  Moisturizers and prescriptions:  . ALWAYS apply moisturizers immediately after bathing (within 3 minutes). This helps to lock-in moisture. . Use the moisturizer several times a day over the whole body. Kermit Balo summer moisturizers include: Aveeno, CeraVe, Cetaphil. Kermit Balo winter moisturizers include: Aquaphor, Vaseline, Cerave, Cetaphil, Eucerin, Vanicream. . When using moisturizers along with medications, the moisturizer should be applied about one hour after applying the medication to prevent diluting effect of the medication or moisturize around where you applied the medications. When not using medications, the moisturizer can be continued twice daily as maintenance.  Laundry and clothing: . Avoid laundry products with added color or perfumes. . Use unscented hypo-allergenic laundry products such as Tide free, Cheer free & gentle, and All free and clear.  . If the skin still seems dry or sensitive, you can try double-rinsing the clothes. . Avoid tight or scratchy clothing such as wool. . Do not use fabric softeners or dyer sheets.  Reducing Pollen Exposure . Pollen seasons: trees (spring), grass (summer) and ragweed/weeds (fall). Marland Kitchen Keep windows closed in your home and car to lower pollen exposure.  Susa Simmonds air conditioning in the bedroom and throughout  the house if possible.  . Avoid going out in dry windy days - especially early morning. . Pollen counts are highest between 5 - 10 AM and on dry, hot and windy days.  . Save outside activities for late afternoon or after a heavy rain, when pollen levels are lower.  . Avoid mowing of grass if you have grass pollen allergy. Marland Kitchen Be aware that pollen can also be transported indoors on people and pets.  . Dry your clothes in an automatic dryer rather than hanging them outside where they might collect pollen.  . Rinse hair and eyes before bedtime. Control of House Dust Mite Allergen . Dust mite allergens are a common trigger of allergy and asthma symptoms. While they can be found throughout the house, these microscopic creatures thrive in warm, humid environments such as bedding, upholstered furniture and carpeting. . Because so much time is spent in the bedroom, it is essential to reduce mite levels there.  . Encase pillows, mattresses, and box springs in special allergen-proof fabric covers or airtight, zippered plastic covers.  . Bedding should be washed weekly in hot water (130 F) and dried in a hot dryer. Allergen-proof covers are available for comforters and pillows that can't be regularly washed.  Wendee Copp the allergy-proof covers every few months. Minimize clutter in the bedroom. Keep pets out of the bedroom.  Marland Kitchen Keep humidity less than 50% by using a dehumidifier or air conditioning. You can buy a humidity measuring device called a hygrometer to monitor this.  . If possible, replace carpets with hardwood, linoleum, or washable area rugs. If that's not  possible, vacuum frequently with a vacuum that has a HEPA filter. . Remove all upholstered furniture and non-washable window drapes from the bedroom. . Remove all non-washable stuffed toys from the bedroom.  Wash stuffed toys weekly.

## 2018-12-24 NOTE — Progress Notes (Signed)
New Patient Note  RE: Caitlin Stark MRN: [id number] DOB: [birth year] Date of Office Visit: 12/24/2018  Referring provider: Eulas Post, MD Primary care provider: Seward Carol, MD  Chief Complaint: Urticaria (lasting for 4 days ); Food Intolerance ([place] nuts, citrus food); and Allergic Rhinitis  (Cats )  History of Present Illness: I had the pleasure of seeing Caitlin Stark for initial evaluation at the [facility] on 12/26/2018. She is a 48 y.o. female, who is referred here by ER for the evaluation of urticaria/allergic reaction. Patient was seen by our office in the past (not sure how long ago) for allergic rhinitis - records not available for review.   Rash started about 1.5 week ago. It started on her anterior chest and it gradually started to spread to the rest of her body including upper medial thigh. By day 4 patient felt some throat itching and globus sensation.   Describes them as pruritic, raised, erythematous. Individual rashes lasted for a few days. No ecchymosis upon resolution. Associated symptoms include: globus sensation, throat itching. Suspected triggers are unknown. Denies any fevers, chills, personal care products, or recent infections. Patient started to take more vitamin C and [place] nuts the same week.   She has tried the following therapies: allegra, Pepcid, prednisone with some benefit. Systemic steroids yes once. Currently on no medications.  Previous history of rash/hives: denies. Patient is up to date with the following cancer screening tests: mammogram, pap smears.  Dietary History: patient has been eating other foods including milk, eggs, peanut, treenuts, sesame, shellfish, seafood, soy, wheat, meats, fruits and vegetables.  No pictures available for review.   12/16/2018 ER visit HPI: "48 year old female who presents with hives times several days.  Recently started taking care of a stray cat and does have a known allergic  reaction to cats.  Has been using antihistamines without relief.  Feels fullness in her throat but denies any trouble swallowing.  Denies being dyspneic.  Hives have been throughout her body and have been pruritic.  No prior history of same denies any new chemicals."  Assessment and Plan: Caitlin Stark is a 48 y.o. female with: Rash and other nonspecific skin eruption Broke out rash 1.5 week ago which is pruritic. Now mainly resolved with prednisone. Denies any changes in medications, personal care products, recent infections. She did start to consume increased amounts of vitamin c and [place] nuts. She also has a stray outdoor cat which she has been feeding for the past 1.5 month. No previous history of rash like this.  Today's skin testing showed: positive to grass, weed, ragweed, tree, mold, dust mites. Negative to cat and dog and [place] nuts.   Given clinical history not sure what may have caused this episode of rash/hive outbreak as the rash occurred in areas where her skin was not exposed to the air.   The pollen allergy could be causing the throat itching.   Symptoms have improved significantly and only has 1 pinpoint rash present today. This may be due to the prednisone still in her system.  Keep track of rash/hives. Take pictures.  May use over the counter antihistamines such as Zyrtec (cetirizine) or Xyzal (levocetirizine) daily as needed and may increase to twice a day if needed.   If still persistent then will order some bloodwork.  Discussed proper skin care.  Other allergic rhinitis Perennial rhinoconjunctivitis symptoms for 10+ years and using over-the-counter antihistamines with good benefit.  Today skin testing was positive to grass,  weed, ragweed, tree, mold, dust mites.  Discussed environmental control measures.  May use over the counter antihistamines such as Zyrtec (cetirizine), Claritin (loratadine), Allegra (fexofenadine), or Xyzal (levocetirizine) daily as needed.   Return in about 2 months (around 02/23/2019).  Other allergy screening: Asthma: no Rhino conjunctivitis: yes  She reports symptoms of rhinorrhea, itchy/watery eyes. Symptoms have been going on for 10 years. The symptoms are present all year around. Anosmia: no. Headache: no. She has used OC antihistamines with some improvement in symptoms. Previous work up includes: skin testing in the past was positive to multiple items.  Medication allergy: yes  Amoxicillin - periorbital angioedema Lisinopril - lip angioedema  Morphine - hives Hymenoptera allergy: no Eczema:no History of recurrent infections suggestive of immunodeficency: no  Diagnostics: Skin Testing: Environmental allergy panel and select foods. Positive test to: grass, weed, ragweed, tree, mold, dust mites. Negative test to: foods.  Results discussed with patient/family. Airborne Adult Perc - 12/24/18 1400    Time Antigen Placed  1400    Allergen Manufacturer  Greer    Location  Back    Number of Test  59    Panel 1  Select    1. Control-Buffer 50% Glycerol  Negative    2. Control-Histamine 1 mg/ml  2+    3. Albumin saline  Negative    4. Metolius  Negative    5. [place]  2+    6. Johnson  Negative    7. Kittson Blue  Negative    8. Meadow Fescue  Negative    9. Perennial Rye  Negative    10. Sweet Vernal  Negative    11. Timothy  Negative    12. Cocklebur  Negative    13. Burweed Marshelder  2+    14. Ragweed, short  Negative    15. Ragweed, Giant  Negative    16. Plantain,  English  Negative    17. Lamb's Quarters  Negative    18. Sheep Sorrell  Negative    19. Rough Pigweed  Negative    20. Marsh Elder, Rough  Negative    21. Mugwort, Common  Negative    22. Ash mix  Negative    23. Birch mix  Negative    24. Beech American  Negative    25. Box, Elder  Negative    26. Cedar, red  --   +/-   27. Cottonwood, Eastern  Negative    28. Elm mix  Negative    29. Hickory mix  Negative    30. Maple mix  Negative     31. Oak, [place] mix  Negative    32. Pecan Pollen  Negative    33. Pine mix  Negative    34. Sycamore Eastern  Negative    35. [place], Black Pollen  Negative    36. Alternaria alternata  Negative    37. Cladosporium Herbarum  Negative    38. Aspergillus mix  Negative    39. Penicillium mix  Negative    40. Bipolaris sorokiniana (Helminthosporium)  Negative    41. Drechslera spicifera (Curvularia)  Negative    42. Mucor plumbeus  Negative    43. Fusarium moniliforme  Negative    44. Aureobasidium pullulans (pullulara)  Negative    45. Rhizopus oryzae  Negative    46. Botrytis cinera  Negative    47. Epicoccum nigrum  Negative    48. Phoma betae  Negative    49. Candida Albicans  Negative  50. Trichophyton mentagrophytes  Negative    51. Mite, D Farinae  5,000 AU/ml  2+    52. Mite, D Pteronyssinus  5,000 AU/ml  2+    53. Cat Hair 10,000 BAU/ml  Negative    54.  Dog Epithelia  Negative    55. Mixed Feathers  Negative    56. Horse Epithelia  Negative    57. Cockroach, German  Negative    58. Mouse  Negative    59. Tobacco Leaf  Negative     Intradermal - 12/24/18 1451    Time Antigen Placed  1450    Allergen Manufacturer  Lavella Hammock    Location  Arm    Number of Test  13    Intradermal  Select    Control  Negative    Johnson  2+    7 Grass  Negative    Ragweed mix  2+    Weed mix  2+    Tree mix  3+    Mold 1  Negative    Mold 2  2+    Mold 3  Negative    Mold 4  Negative    Cat  Negative    Dog  Negative    Cockroach  Negative     Food Adult Perc - 12/24/18 1400    Time Antigen Placed  1400    Allergen Manufacturer  Greer    Location  Back    Number of allergen test  10    Panel 2  Select    Control-Histamine 1 mg/ml  2+    10. Cashew  Negative    11. Pecan Food  Negative    12. Dora  Negative    13. Almond  Negative    14. Hazelnut  Negative    15. [place] nut  Negative    16. Coconut  Negative    17. Pistachio  Negative    56. Orange   Negative        Past Medical History: Patient Active Problem List   Diagnosis Date Noted  . Other allergic rhinitis 12/26/2018  . Allergic reaction 12/26/2018  . Rash and other nonspecific skin eruption 12/26/2018  . Vitamin D deficiency 09/19/2017  . Prediabetes 09/19/2017  . Elevated LFTs 09/19/2017  . Essential hypertension 09/19/2017  . Class 1 obesity with serious comorbidity and body mass index (BMI) of 30.0 to 30.9 in adult 09/19/2017  . HYPERTENSION 05/13/2009   Past Medical History:  Diagnosis Date  . Abnormal Pap smear 1993  . Anemia   . Breast mass, right   . Fibroid   . Hirsutism   . Hypertension   . Menorrhagia   . Vitamin D deficiency    Past Surgical History: Past Surgical History:  Procedure Laterality Date  . ABDOMINOPLASTY    . CESAREAN SECTION    . Diastasis Repair  2010  . HYSTEROSCOPY W/D&C    . LIPOSUCTION    . TUBAL LIGATION     Medication List:  Current Outpatient Medications  Medication Sig Dispense Refill  . diltiazem (DILACOR XR) 180 MG 24 hr capsule Take 180 mg by mouth daily.      . famotidine (PEPCID) 20 MG tablet Take 1 tablet (20 mg total) by mouth 2 (two) times daily. 30 tablet 0  . fexofenadine (ALLEGRA) 180 MG tablet Take 180 mg by mouth daily.    . metFORMIN (GLUCOPHAGE) 500 MG tablet TAKE  (1)  TABLET TWICE A DAY WITH MEALS (  BREAKFAST AND SUPPER) 60 tablet 0  . spironolactone (ALDACTONE) 25 MG tablet Take 1 tablet (25 mg total) by mouth 2 (two) times daily.    . Vitamin D, Ergocalciferol, (DRISDOL) 1.25 MG (50000 UT) CAPS capsule Take 1 capsule (50,000 Units total) by mouth every 7 (seven) days. 4 capsule 0   No current facility-administered medications for this visit.    Allergies: Allergies  Allergen Reactions  . Amoxicillin     REACTION: eyes swelling  . Lisinopril     REACTION: swelling of eyes and lower lip  . Morphine And Related Hives   Social History: Social History   Socioeconomic History  . Marital status: Married     Spouse name: Dominica Kent  . Number of children: 2  . Years of education: Not on file  . Highest education level: Not on file  Occupational History  . Not on file  Social Needs  . Financial resource strain: Not on file  . Food insecurity:    Worry: Not on file    Inability: Not on file  . Transportation needs:    Medical: Not on file    Non-medical: Not on file  Tobacco Use  . Smoking status: Never Smoker  . Smokeless tobacco: Never Used  Substance and Sexual Activity  . Alcohol use: No  . Drug use: No  . Sexual activity: Yes    Birth control/protection: Surgical    Comment: BTL  Lifestyle  . Physical activity:    Days per week: Not on file    Minutes per session: Not on file  . Stress: Not on file  Relationships  . Social connections:    Talks on phone: Not on file    Gets together: Not on file    Attends religious service: Not on file    Active member of club or organization: Not on file    Attends meetings of clubs or organizations: Not on file    Relationship status: Not on file  Other Topics Concern  . Not on file  Social History Narrative  . Not on file   Lives in a 49 year old home. Smoking: denies Occupation: IT sales professional HistoryFreight forwarder in the house: no Charity fundraiser in the family room: yes Carpet in the bedroom: yes Heating: electric Cooling: central Pet: yes 1 dog x 2 yrs, 1 cat outdoors x 6 months  Family History: Family History  Problem Relation Age of Onset  . Hypertension Other   . Anemia Other   . Autoimmune disease Other   . Diabetes Mother   . Hypertension Mother   . Hyperlipidemia Mother   . Obesity Mother   . Diabetes Father   . Hyperlipidemia Father   . Hypertension Father   . Sudden death Father   . Obesity Father    Problem                               Relation Asthma                                   No  Eczema                                No  Food allergy  No  Allergic rhino  conjunctivitis     children  Review of Systems  Constitutional: Negative for appetite change, chills, fever and unexpected weight change.  HENT: Negative for congestion and rhinorrhea.   Eyes: Negative for itching.  Respiratory: Negative for cough, chest tightness, shortness of breath and wheezing.   Cardiovascular: Negative for chest pain.  Gastrointestinal: Negative for abdominal pain.  Genitourinary: Negative for difficulty urinating.  Skin: Positive for rash.  Allergic/Immunologic: Positive for environmental allergies.  Neurological: Negative for headaches.   Objective: BP 112/64   Pulse 68   Temp 97.7 F (36.5 C) (Tympanic)   Resp 16   Ht 5\' 7"  (1.702 m)   Wt 202 lb 6.4 oz (91.8 kg)   LMP 09/10/2012   SpO2 97%   BMI 31.70 kg/m  Body mass index is 31.7 kg/m. Physical Exam  Constitutional: She is oriented to person, place, and time. She appears well-developed and well-nourished.  HENT:  Head: Normocephalic and atraumatic.  Right Ear: External ear normal.  Left Ear: External ear normal.  Nose: Nose normal.  Mouth/Throat: Oropharynx is clear and moist.  Eyes: Conjunctivae and EOM are normal.  Neck: Neck supple.  Cardiovascular: Normal rate, regular rhythm and normal heart sounds. Exam reveals no gallop and no friction rub.  No murmur heard. Pulmonary/Chest: Effort normal and breath sounds normal. She has no wheezes. She has no rales.  Abdominal: Soft.  Neurological: She is alert and oriented to person, place, and time.  Skin: Skin is warm. Rash noted.  One erythematous raised rash which is about 3x32mm on the left upper thigh anteriorly.  Psychiatric: She has a normal mood and affect. Her behavior is normal.  Nursing note and vitals reviewed.  The plan was reviewed with the patient/family, and all questions/concerned were addressed.  It was my pleasure to see Caitlin Stark today and participate in her care. Please feel free to contact me with any questions or concerns.   Sincerely,  Rexene Alberts, DO Allergy & Immunology  [facility] office: [phone] [facility] office: [phone]

## 2018-12-26 ENCOUNTER — Encounter: Payer: Self-pay | Admitting: Allergy

## 2018-12-26 DIAGNOSIS — J3089 Other allergic rhinitis: Secondary | ICD-10-CM | POA: Insufficient documentation

## 2018-12-26 DIAGNOSIS — R21 Rash and other nonspecific skin eruption: Secondary | ICD-10-CM | POA: Insufficient documentation

## 2018-12-26 DIAGNOSIS — T7840XA Allergy, unspecified, initial encounter: Secondary | ICD-10-CM | POA: Insufficient documentation

## 2018-12-26 NOTE — Assessment & Plan Note (Signed)
Perennial rhinoconjunctivitis symptoms for 10+ years and using over-the-counter antihistamines with good benefit.  Today skin testing was positive to grass, weed, ragweed, tree, mold, dust mites.  Discussed environmental control measures.  May use over the counter antihistamines such as Zyrtec (cetirizine), Claritin (loratadine), Allegra (fexofenadine), or Xyzal (levocetirizine) daily as needed.

## 2018-12-26 NOTE — Assessment & Plan Note (Addendum)
Broke out rash 1.5 week ago which is pruritic. Now mainly resolved with prednisone. Denies any changes in medications, personal care products, recent infections. She did start to consume increased amounts of vitamin c and [place] nuts. She also has a stray outdoor cat which she has been feeding for the past 1.5 month. No previous history of rash like this.  Today's skin testing showed: positive to grass, weed, ragweed, tree, mold, dust mites. Negative to cat and dog and [place] nuts.   Given clinical history not sure what may have caused this episode of rash/hive outbreak as the rash occurred in areas where her skin was not exposed to the air.   The pollen allergy could be causing the throat itching.   Symptoms have improved significantly and only has 1 pinpoint rash present today. This may be due to the prednisone still in her system.  Keep track of rash/hives. Take pictures.  May use over the counter antihistamines such as Zyrtec (cetirizine) or Xyzal (levocetirizine) daily as needed and may increase to twice a day if needed.   If still persistent then will order some bloodwork.  Discussed proper skin care.

## 2019-02-07 ENCOUNTER — Other Ambulatory Visit: Payer: Self-pay

## 2019-02-07 ENCOUNTER — Ambulatory Visit: Payer: BC Managed Care – PPO | Admitting: Podiatry

## 2019-02-07 ENCOUNTER — Encounter: Payer: Self-pay | Admitting: Podiatry

## 2019-02-07 ENCOUNTER — Ambulatory Visit (INDEPENDENT_AMBULATORY_CARE_PROVIDER_SITE_OTHER): Payer: BC Managed Care – PPO

## 2019-02-07 ENCOUNTER — Other Ambulatory Visit: Payer: Self-pay | Admitting: Podiatry

## 2019-02-07 VITALS — Resp 16

## 2019-02-07 DIAGNOSIS — D259 Leiomyoma of uterus, unspecified: Secondary | ICD-10-CM | POA: Insufficient documentation

## 2019-02-07 DIAGNOSIS — M7672 Peroneal tendinitis, left leg: Secondary | ICD-10-CM

## 2019-02-07 DIAGNOSIS — M722 Plantar fascial fibromatosis: Secondary | ICD-10-CM

## 2019-02-07 DIAGNOSIS — Z862 Personal history of diseases of the blood and blood-forming organs and certain disorders involving the immune mechanism: Secondary | ICD-10-CM | POA: Insufficient documentation

## 2019-02-07 DIAGNOSIS — B3731 Acute candidiasis of vulva and vagina: Secondary | ICD-10-CM | POA: Insufficient documentation

## 2019-02-07 DIAGNOSIS — F5089 Other specified eating disorder: Secondary | ICD-10-CM | POA: Insufficient documentation

## 2019-02-07 DIAGNOSIS — N898 Other specified noninflammatory disorders of vagina: Secondary | ICD-10-CM | POA: Insufficient documentation

## 2019-02-07 DIAGNOSIS — L680 Hirsutism: Secondary | ICD-10-CM | POA: Insufficient documentation

## 2019-02-07 DIAGNOSIS — N912 Amenorrhea, unspecified: Secondary | ICD-10-CM | POA: Insufficient documentation

## 2019-02-07 DIAGNOSIS — N951 Menopausal and female climacteric states: Secondary | ICD-10-CM | POA: Insufficient documentation

## 2019-02-07 DIAGNOSIS — N926 Irregular menstruation, unspecified: Secondary | ICD-10-CM | POA: Insufficient documentation

## 2019-02-07 DIAGNOSIS — B373 Candidiasis of vulva and vagina: Secondary | ICD-10-CM | POA: Insufficient documentation

## 2019-02-07 MED ORDER — MELOXICAM 15 MG PO TABS: 15.0000 mg | ORAL_TABLET | Freq: Every day | ORAL | 0 refills | Status: DC

## 2019-02-07 MED ORDER — TRIAMCINOLONE ACETONIDE 10 MG/ML IJ SUSP
10.0000 mg | Freq: Once | INTRAMUSCULAR | Status: AC
  Administered 2019-02-07: 10 mg

## 2019-02-07 NOTE — Patient Instructions (Signed)

## 2019-02-17 NOTE — Progress Notes (Signed)
Subjective: 48 year old female presents the office today for concerns of right foot plantar fasciitis flareup.  This is been ongoing last couple of months.  Max her pain level is 8/10 is intermittent.  She denies any recent injury or trauma.  She states it gets worse after running or after being on her feet all day.  She also has started to get some discomfort to lateral aspect the left foot which is been on the last couple months as well which started after running.  No swelling.  No numbness or tingling.  The pain does not wake her up at night. Denies any systemic complaints such as fevers, chills, nausea, vomiting. No acute changes since last appointment, and no other complaints at this time.   Objective: AAO x3, NAD DP/PT pulses palpable bilaterally, CRT less than 3 seconds Tenderness to palpation along the plantar medial tubercle of the calcaneus at the insertion of plantar fascia on the right foot. There is no pain along the course of the plantar fascia within the arch of the foot. Plantar fascia appears to be intact. There is no pain with lateral compression of the calcaneus or pain with vibratory sensation. There is no pain along the course or insertion of the achilles tendon.  Mild discomfort in the course of the peroneal tendons on the left side.  Overall the peroneal tendons appear to be intact.  No other areas of tenderness to bilateral lower extremities. No open lesions or pre-ulcerative lesions.  No pain with calf compression, swelling, warmth, erythema  Assessment: Right foot plantar fasciitis, left foot peroneal tendinitis  Plan: -All treatment options discussed with the patient including all alternatives, risks, complications.  -X-rays were obtained and reviewed.  No evidence of acute fracture. -Steroid injection performed.  See procedure note below. -Discussed traction, ice exercises daily.  Plantar fascial brace.-Prescribed mobic. Discussed side effects of the medication and  directed to stop if any are to occur and call the office.  -Patient encouraged to call the office with any questions, concerns, change in symptoms.   Procedure: Injection Tendon/Ligament Discussed alternatives, risks, complications and verbal consent was obtained.  Location: Right plantar fascia at the glabrous junction; medial approach. Skin Prep: Alcohol Injectate: 0.5cc 0.5% marcaine plain, 0.5 cc 2% lidocaine plain and, 1 cc kenalog 10. Disposition: Patient tolerated procedure well. Injection site dressed with a band-aid.  Post-injection care was discussed and return precautions discussed.   Trula Slade DPM

## 2019-02-20 ENCOUNTER — Ambulatory Visit: Payer: BC Managed Care – PPO | Admitting: Allergy

## 2019-02-28 ENCOUNTER — Ambulatory Visit: Payer: BC Managed Care – PPO | Admitting: Podiatry

## 2019-04-01 ENCOUNTER — Other Ambulatory Visit: Payer: Self-pay | Admitting: Podiatry

## 2019-04-02 ENCOUNTER — Telehealth: Payer: Self-pay | Admitting: Podiatry

## 2019-04-02 MED ORDER — MELOXICAM 15 MG PO TABS: 15.0000 mg | ORAL_TABLET | Freq: Every day | ORAL | 0 refills | Status: DC

## 2019-04-02 NOTE — Addendum Note (Signed)
Addended by: Harriett Sine D on: 04/02/2019 04:30 PM   Modules accepted: Orders

## 2019-04-02 NOTE — Telephone Encounter (Signed)
Pt is calling to follow up on her medication refill request sent from her pharmacy

## 2019-04-03 ENCOUNTER — Telehealth: Payer: Self-pay | Admitting: *Deleted

## 2019-04-03 NOTE — Telephone Encounter (Signed)
Per Dr Jacqualyn Posey ok to refill the mobic. Caitlin Stark

## 2019-09-04 ENCOUNTER — Emergency Department (HOSPITAL_COMMUNITY): Payer: BC Managed Care – PPO

## 2019-09-04 ENCOUNTER — Emergency Department (HOSPITAL_COMMUNITY)
Admission: EM | Admit: 2019-09-04 | Discharge: 2019-09-05 | Discharge status: Against Medical Advice | Payer: BC Managed Care – PPO | Attending: Emergency Medicine | Admitting: Emergency Medicine

## 2019-09-04 ENCOUNTER — Other Ambulatory Visit: Payer: Self-pay

## 2019-09-04 ENCOUNTER — Encounter (HOSPITAL_COMMUNITY): Payer: Self-pay | Admitting: Emergency Medicine

## 2019-09-04 DIAGNOSIS — Z5321 Procedure and treatment not carried out due to patient leaving prior to being seen by health care provider: Secondary | ICD-10-CM | POA: Diagnosis not present

## 2019-09-04 DIAGNOSIS — R0789 Other chest pain: Secondary | ICD-10-CM | POA: Diagnosis present

## 2019-09-04 LAB — I-STAT BETA HCG BLOOD, ED (MC, WL, AP ONLY): I-stat hCG, quantitative: <5 m[IU]/mL (ref <5)

## 2019-09-04 LAB — BASIC METABOLIC PANEL
Anion gap: 10 (ref 5–15)
BUN: 19 mg/dL (ref 6–20)
CO2: 26 mmol/L (ref 22–32)
Calcium: 9.5 mg/dL (ref 8.9–10.3)
Chloride: 104 mmol/L (ref 98–111)
Creatinine, Ser: 1.15 mg/dL — ABNORMAL HIGH (ref 0.44–1.00)
GFR calc Af Amer: >60 mL/min (ref >60)
GFR calc non Af Amer: 56 mL/min — ABNORMAL LOW (ref >60)
Glucose, Bld: 101 mg/dL — ABNORMAL HIGH (ref 70–99)
Potassium: 3.9 mmol/L (ref 3.5–5.1)
Sodium: 140 mmol/L (ref 135–145)

## 2019-09-04 LAB — CBC
HCT: 43.2 % (ref 36.0–46.0)
Hemoglobin: 13.6 g/dL (ref 12.0–15.0)
MCH: 28.3 pg (ref 26.0–34.0)
MCHC: 31.5 g/dL (ref 30.0–36.0)
MCV: 89.8 fL (ref 80.0–100.0)
Platelets: 390 10*3/uL (ref 150–400)
RBC: 4.81 MIL/uL (ref 3.87–5.11)
RDW: 13.9 % (ref 11.5–15.5)
WBC: 10.8 10*3/uL — ABNORMAL HIGH (ref 4.0–10.5)
nRBC: 0 % (ref 0.0–0.2)

## 2019-09-04 LAB — TROPONIN I (HIGH SENSITIVITY): Troponin I (High Sensitivity): <2 ng/L (ref <18)

## 2019-09-04 MED ORDER — SODIUM CHLORIDE 0.9% FLUSH: 3.0000 mL | Freq: Once | INTRAVENOUS | Status: DC

## 2019-09-04 NOTE — ED Triage Notes (Signed)
Patient reports intermittent central chest pain " feels like heart burn"onset last night , patient took Gas-X with mild relief , no emesis or diaphoresis.

## 2019-09-05 LAB — TROPONIN I (HIGH SENSITIVITY): Troponin I (High Sensitivity): <2 ng/L (ref <18)

## 2019-09-05 NOTE — ED Notes (Signed)
Pt left AMA. Pt was aware of risks and was encouraged to stay. Pt was tired of waiting and stated chest pains have gone away entire time they were in the waiting room.

## 2019-10-15 ENCOUNTER — Other Ambulatory Visit: Payer: Self-pay

## 2019-10-15 ENCOUNTER — Encounter: Payer: Self-pay | Admitting: Cardiovascular Disease

## 2019-10-15 ENCOUNTER — Ambulatory Visit: Payer: BC Managed Care – PPO | Admitting: Cardiovascular Disease

## 2019-10-15 VITALS — BP 118/60 | HR 74 | Temp 96.3°F | Ht 67.0 in | Wt 207.0 lb

## 2019-10-15 DIAGNOSIS — Z01812 Encounter for preprocedural laboratory examination: Secondary | ICD-10-CM | POA: Diagnosis not present

## 2019-10-15 DIAGNOSIS — Z8249 Family history of ischemic heart disease and other diseases of the circulatory system: Secondary | ICD-10-CM | POA: Diagnosis not present

## 2019-10-15 DIAGNOSIS — R079 Chest pain, unspecified: Secondary | ICD-10-CM | POA: Diagnosis not present

## 2019-10-15 DIAGNOSIS — I10 Essential (primary) hypertension: Secondary | ICD-10-CM

## 2019-10-15 DIAGNOSIS — R0789 Other chest pain: Secondary | ICD-10-CM

## 2019-10-15 MED ORDER — METOPROLOL TARTRATE 100 MG PO TABS: ORAL_TABLET | ORAL | 0 refills | Status: DC

## 2019-10-15 NOTE — Progress Notes (Signed)
Cardiology Office Note   Date:  11/01/2019   ID:  Caitlin Stark, DOB [birth year], MRN [id number]  PCP:  Caitlin Carol, MD  Cardiologist:   Caitlin Latch, MD   No chief complaint on file.    History of Present Illness: Caitlin Stark is a 49 y.o. female with hypertension, who is being seen today for the evaluation of chest pain at the request of Caitlin Carol, MD.   Caitlin Stark came to the ED 12/30 with chest pain but left without being seen. High sensitivity troponin was <2 and EKG showed inferolateral T wave abnormalities.  She followed up with Caitlin Stark on 09/09/2019 and reported atypical chest pain.  However given her family history of CAD and hypertension she was referred to cardiology.  One month ago she had an episode of indigestion that woke her up.  She took gas-x and it improved.  However it recurred the following day after breakfast and throughout the day.  She took several medications but continued to have symptoms so she went to the ED. since that time she has felt well.  She has not had any recurrent symptoms.  Prior to the pandemic she was exercising regularly at [facility].  However lately she has been unable to exercise as much.  She did walk for exercise yesterday and had no exertional chest pain or shortness of breath.  She denies lower extremity edema, orthopnea, or PND.  She notes occasional episodes of awakening with her heart racing.  She thinks it has been a little bit better lately.  She does not snore and feels rested when she awakens in the mornings.  She thinks that spironolactone may be giving her insomnia.  She struggled with her blood pressure for many years before settling on her current regimen.  Last summer she had an episode of severe plantar fasciitis and was taking meloxicam regularly.  However she has not needed it much recently.  She takes it very sparingly.  She uses her Pepcid as needed.  Of note, her father died of sudden cardiac death.  Her  brother also had a heart attack at age 61 and died.  She has a known abnormality in her EKG.  She underwent stress testing in [place].  When she moved to [place] and around 10 years ago she saw cardiology and had an echocardiogram that was reportedly normal.  Past Medical History:  Diagnosis Date  . Abnormal Pap smear 1993  . Anemia   . Atypical chest pain 11/01/2019  . Breast mass, right   . Fibroid   . Hirsutism   . Hypertension   . Menorrhagia   . Vitamin D deficiency     Past Surgical History:  Procedure Laterality Date  . ABDOMINOPLASTY    . CESAREAN SECTION    . Diastasis Repair  2010  . HYSTEROSCOPY WITH D & C    . LIPOSUCTION    . TUBAL LIGATION       Current Outpatient Medications  Medication Sig Dispense Refill  . diltiazem (DILACOR XR) 180 MG 24 hr capsule Take 180 mg by mouth daily.      . famotidine (PEPCID) 20 MG tablet Take 1 tablet (20 mg total) by mouth 2 (two) times daily. 30 tablet 0  . fexofenadine (ALLEGRA) 180 MG tablet Take 180 mg by mouth daily.    . meloxicam (MOBIC) 15 MG tablet TAKE 1 TABLET EACH DAY. 30 tablet 0  . metFORMIN (GLUCOPHAGE) 500 MG tablet TAKE  (1)  TABLET TWICE A DAY WITH MEALS (BREAKFAST AND SUPPER) 60 tablet 0  . spironolactone (ALDACTONE) 25 MG tablet Take 25 mg by mouth as directed. TAKE 2 TABLETS BY MOUTH EVERY MORNING    . Vitamin D, Ergocalciferol, (DRISDOL) 1.25 MG (50000 UT) CAPS capsule Take 1 capsule (50,000 Units total) by mouth every 7 (seven) days. 4 capsule 0  . metoprolol tartrate (LOPRESSOR) 100 MG tablet TAKE 1 TABLET 2 HOURS PRIOR TO CT 1 tablet 0   No current facility-administered medications for this visit.    Allergies:   Amoxicillin, Lisinopril, and Morphine and related    Social History:  The patient  reports that she has never smoked. She has never used smokeless tobacco. She reports that she does not drink alcohol or use drugs.   Family History:  The patient's family history includes Anemia in an other  family member; Autoimmune disease in an other family member; CAD in her father; CAD (age of onset: 38) in her brother; Diabetes in her brother, father, and mother; Hyperlipidemia in her father and mother; Hypertension in her father, mother, and another family member; Obesity in her father and mother; Sudden death in her father.    ROS:  Please see the history of present illness.   Otherwise, review of systems are positive for none.   All other systems are reviewed and negative.    PHYSICAL EXAM: VS:  BP 118/60   Pulse 74   Temp (!) 96.3 F (35.7 C)   Ht 5\' 7"  (1.702 m)   Wt 207 lb (93.9 kg)   LMP 09/10/2012   SpO2 98%   BMI 32.42 kg/m  , BMI Body mass index is 32.42 kg/m. GENERAL:  Well appearing HEENT:  Pupils equal round and reactive, fundi not visualized, oral mucosa unremarkable NECK:  No jugular venous distention, waveform within normal limits, carotid upstroke brisk and symmetric, no bruits, no thyromegaly LYMPHATICS:  No cervical adenopathy LUNGS:  Clear to auscultation bilaterally HEART:  RRR.  PMI not displaced or sustained,S1 and S2 within normal limits, no S3, no S4, no clicks, no rubs, no murmurs ABD:  Flat, positive bowel sounds normal in frequency in pitch, no bruits, no rebound, no guarding, no midline pulsatile mass, no hepatomegaly, no splenomegaly EXT:  2 plus pulses throughout, no edema, no cyanosis no clubbing SKIN:  No rashes no nodules NEURO:  Cranial nerves II through XII grossly intact, motor grossly intact throughout PSYCH:  Cognitively intact, oriented to person place and time    EKG:  EKG is not ordered today. 09/04/2019: Sinus rhythm.  Rate 67 bpm.  Inferolateral T wave abnormalities.  Recent Labs: 09/04/2019: BUN 19; Creatinine, Ser 1.15; Hemoglobin 13.6; Platelets 390; Potassium 3.9; Sodium 140    Lipid Panel    Component Value Date/Time   CHOL 171 09/06/2017 0939   TRIG 69 09/06/2017 0939   HDL 59 09/06/2017 0939   LDLCALC 98 09/06/2017 0939       Wt Readings from Last 3 Encounters:  10/15/19 207 lb (93.9 kg)  12/24/18 202 lb 6.4 oz (91.8 kg)  07/17/18 199 lb (90.3 kg)      ASSESSMENT AND PLAN:  #Atypical chest pain: Caitlin Stark symptoms are atypical and seem more consistent with GERD than ischemia.  However, she has an extensive family history of premature CAD.  She also has inferolateral T wave inversions on her EKG which could be consistent with ischemia.  Her EKG is relatively stable from 2019 at which time she had  inferolateral ST flattening.  We will get a coronary CT-a to evaluate her coronaries and also better with stratify.  # Essential hypertension: Blood pressure is well-controlled.  She can start taking spironolactone 50 mg in the morning instead of twice daily, as she thinks it contributes to insomnia.  Continue diltiazem.    Current medicines are reviewed at length with the patient today.  The patient does not have concerns regarding medicines.  The following changes have been made:  no change  Labs/ tests ordered today include:   Orders Placed This Encounter  Procedures  . CT CORONARY MORPH W/CTA COR W/SCORE W/CA W/CM &/OR WO/CM  . CT CORONARY FRACTIONAL FLOW RESERVE DATA PREP  . CT CORONARY FRACTIONAL FLOW RESERVE FLUID ANALYSIS  . Basic metabolic panel     Disposition:   FU with Larry Knipp C. Oval Linsey, MD, [facility] in 1 year    Signed, Kien Mirsky C. Oval Linsey, MD, [facility]  11/01/2019 2:53 PM    [place]

## 2019-10-15 NOTE — Patient Instructions (Addendum)
Medication Instructions:  TAKE METOPROLOL 100 MG 2 HOURS PRIOR TO CT  TAKE YOUR SPIRONOLACTONE 25 MG 2 IN THE MORNING   *If you need a refill on your cardiac medications before your next appointment, please call your pharmacy*  Lab Work: BMET 1 WEEK PRIOR TO CT  If you have labs (blood work) drawn today and your tests are completely normal, you will receive your results only by: Marland Kitchen MyChart Message (if you have MyChart) OR . A paper copy in the mail If you have any lab test that is abnormal or we need to change your treatment, we will call you to review the results.  Testing/Procedures: Your physician has requested that you have cardiac CT. Cardiac computed tomography (CT) is a painless test that uses an x-ray machine to take clear, detailed pictures of your heart. For further information please visit [facility]tn. Please follow instruction sheet as given. ONCE YOUR INSURANCE HAS APPROVED THE OFFICE WILL CALL YOU TO SCHEDULE   Follow-Up: At [facility], you and your health needs are our priority.  As part of our continuing mission to provide you with exceptional heart care, we have created designated Provider Care Teams.  These Care Teams include your primary Cardiologist (physician) and Advanced Practice Providers (APPs -  Physician Assistants and Nurse Practitioners) who all work together to provide you with the care you need, when you need it.  Your next appointment:   12 month(s)  The format for your next appointment:   Either In Person or Virtual  Provider:   You may see DR [facility]  or one of the following Advanced Practice Providers on your designated Care Team:    Kerin Ransom, PA-C  [place], [place]  Coletta Memos, [place]   Other Instructions  Your cardiac CT will be scheduled at one of the below locations:   [facility] [address] [place], [place] [address] [phone]  [place] [address] [place], [place] [address] [phone]  If scheduled at [facility], please arrive at the [facility] 30 minutes prior to test start time. Proceed to the [facility] Radiology Department (first floor) to check-in and test prep.  If scheduled at [facility], please arrive 15 mins early for check-in and test prep.  Please follow these instructions carefully (unless otherwise directed):  Hold all erectile dysfunction medications at least 3 days (72 hrs) prior to test.  On the Night Before the Test: . Be sure to Drink plenty of water. . Do not consume any caffeinated/decaffeinated beverages or chocolate 12 hours prior to your test. . Do not take any antihistamines 12 hours prior to your test. . If you take Metformin do not take 24 hours prior to test. . If the patient has contrast allergy: ? Patient will need a prescription for Prednisone and very clear instructions (as follows): 1. Prednisone 50 mg - take 13 hours prior to test 2. Take another Prednisone 50 mg 7 hours prior to test 3. Take another Prednisone 50 mg 1 hour prior to test 4. Take Benadryl 50 mg 1 hour prior to test . Patient must complete all four doses of above prophylactic medications. . Patient will need a ride after test due to Benadryl.  On the Day of the Test: . Drink plenty of water. Do not drink any water within one hour of the test. . Do not eat any food  4 hours prior to the test. . You may take your regular medications prior to the test.  . Take metoprolol (Lopressor) two hours prior to test. . HOLD Furosemide/Hydrochlorothiazide morning of the test. . FEMALES- please wear underwire-free bra if available       After the Test: . Drink plenty of water. . After receiving IV contrast, you may experience a mild flushed feeling. This is normal. . On occasion, you may experience a mild rash up to 24 hours after the test. This is  not dangerous. If this occurs, you can take Benadryl 25 mg and increase your fluid intake. . If you experience trouble breathing, this can be serious. If it is severe call 911 IMMEDIATELY. If it is mild, please call our office. . If you take any of these medications: Glipizide/Metformin, Avandament, Glucavance, please do not take 48 hours after completing test unless otherwise instructed.   Once we have confirmed authorization from your insurance company, we will call you to set up a date and time for your test.   For non-scheduling related questions, please contact the cardiac imaging nurse navigator should you have any questions/concerns: Marchia Bond, RN Navigator Cardiac Imaging [facility] [phone] mobile   Cardiac CT Angiogram A cardiac CT angiogram is a procedure to look at the heart and the area around the heart. It may be done to help find the cause of chest pains or other symptoms of heart disease. During this procedure, a substance called contrast dye is injected into the blood vessels in the area to be checked. A large X-ray machine, called a CT scanner, then takes detailed pictures of the heart and the surrounding area. The procedure is also sometimes called a coronary CT angiogram, coronary artery scanning, or CTA. A cardiac CT angiogram allows the health care provider to see how well blood is flowing to and from the heart. The health care provider will be able to see if there are any problems, such as:  Blockage or narrowing of the coronary arteries in the heart.  Fluid around the heart.  Signs of weakness or disease in the muscles, valves, and tissues of the heart. Tell a health care provider about:  Any allergies you have. This is especially important if you have had a previous allergic reaction to contrast dye.  All medicines you are taking, including vitamins, herbs, eye drops, creams, and over-the-counter medicines.  Any blood disorders  you have.  Any surgeries you have had.  Any medical conditions you have.  Whether you are pregnant or may be pregnant.  Any anxiety disorders, chronic pain, or other conditions you have that may increase your stress or prevent you from lying still. What are the risks? Generally, this is a safe procedure. However, problems may occur, including:  Bleeding.  Infection.  Allergic reactions to medicines or dyes.  Damage to other structures or organs.  Kidney damage from the contrast dye that is used.  Increased risk of cancer from radiation exposure. This risk is low. Talk with your health care provider about: ? The risks and benefits of testing. ? How you can receive the lowest dose of radiation. What happens before the procedure?  Wear comfortable clothing and remove any jewelry, glasses, dentures, and hearing aids.  Follow instructions from your health care provider about eating and drinking. This may include: ? For 12 hours before the procedure -- avoid caffeine. This includes tea, coffee, soda, energy drinks, and diet pills. Drink plenty of  water or other fluids that do not have caffeine in them. Being well hydrated can prevent complications. ? For 4-6 hours before the procedure -- stop eating and drinking. The contrast dye can cause nausea, but this is less likely if your stomach is empty.  Ask your health care provider about changing or stopping your regular medicines. This is especially important if you are taking diabetes medicines, blood thinners, or medicines to treat problems with erections (erectile dysfunction). What happens during the procedure?   Hair on your chest may need to be removed so that small sticky patches called electrodes can be placed on your chest. These will transmit information that helps to monitor your heart during the procedure.  An IV will be inserted into one of your veins.  You might be given a medicine to control your heart rate during the  procedure. This will help to ensure that good images are obtained.  You will be asked to lie on an exam table. This table will slide in and out of the CT machine during the procedure.  Contrast dye will be injected into the IV. You might feel warm, or you may get a metallic taste in your mouth.  You will be given a medicine called nitroglycerin. This will relax or dilate the arteries in your heart.  The table that you are lying on will move into the CT machine tunnel for the scan.  The person running the machine will give you instructions while the scans are being done. You may be asked to: ? Keep your arms above your head. ? Hold your breath. ? Stay very still, even if the table is moving.  When the scanning is complete, you will be moved out of the machine.  The IV will be removed. The procedure may vary among health care providers and hospitals. What can I expect after the procedure? After your procedure, it is common to have:  A metallic taste in your mouth from the contrast dye.  A feeling of warmth.  A headache from the nitroglycerin. Follow these instructions at home:  Take over-the-counter and prescription medicines only as told by your health care provider.  If you are told, drink enough fluid to keep your urine pale yellow. This will help to flush the contrast dye out of your body.  Most people can return to their normal activities right after the procedure. Ask your health care provider what activities are safe for you.  It is up to you to get the results of your procedure. Ask your health care provider, or the department that is doing the procedure, when your results will be ready.  Keep all follow-up visits as told by your health care provider. This is important. Contact a health care provider if:  You have any symptoms of allergy to the contrast dye. These include: ? Shortness of breath. ? Rash or hives. ? A racing heartbeat. Summary  A cardiac CT angiogram  is a procedure to look at the heart and the area around the heart. It may be done to help find the cause of chest pains or other symptoms of heart disease.  During this procedure, a large X-ray machine, called a CT scanner, takes detailed pictures of the heart and the surrounding area after a contrast dye has been injected into blood vessels in the area.  Ask your health care provider about changing or stopping your regular medicines before the procedure. This is especially important if you are taking diabetes medicines, blood  thinners, or medicines to treat erectile dysfunction.  If you are told, drink enough fluid to keep your urine pale yellow. This will help to flush the contrast dye out of your body. This information is not intended to replace advice given to you by your health care provider. Make sure you discuss any questions you have with your health care provider. Document Revised: 04/17/2019 Document Reviewed: 04/17/2019 Elsevier Patient Education  [place].

## 2019-11-01 ENCOUNTER — Encounter: Payer: Self-pay | Admitting: Cardiovascular Disease

## 2019-11-01 DIAGNOSIS — R0789 Other chest pain: Secondary | ICD-10-CM

## 2019-11-01 HISTORY — DX: Other chest pain: R07.89

## 2019-11-14 ENCOUNTER — Other Ambulatory Visit: Payer: Self-pay | Admitting: Obstetrics and Gynecology

## 2019-11-14 DIAGNOSIS — R928 Other abnormal and inconclusive findings on diagnostic imaging of breast: Secondary | ICD-10-CM

## 2019-11-28 ENCOUNTER — Telehealth (HOSPITAL_COMMUNITY): Payer: Self-pay | Admitting: Emergency Medicine

## 2019-11-28 ENCOUNTER — Encounter (HOSPITAL_COMMUNITY): Payer: Self-pay

## 2019-11-28 NOTE — Telephone Encounter (Signed)
Attempted to call patient regarding upcoming cardiac CT appointment. °Left message on voicemail with name and callback number °Citlaly Camplin RN Navigator Cardiac Imaging °[facility] °[phone] Office °[phone] Cell ° °

## 2019-11-29 ENCOUNTER — Ambulatory Visit (HOSPITAL_COMMUNITY)
Admission: RE | Admit: 2019-11-29 | Discharge: 2019-11-29 | Disposition: A | Discharge status: Home / Self Care | Payer: BC Managed Care – PPO | Source: Ambulatory Visit | Attending: Cardiovascular Disease | Admitting: Cardiovascular Disease

## 2019-11-29 ENCOUNTER — Other Ambulatory Visit: Payer: BC Managed Care – PPO

## 2019-11-29 ENCOUNTER — Ambulatory Visit: Admission: RE | Admit: 2019-11-29 | Payer: BC Managed Care – PPO | Source: Ambulatory Visit

## 2019-11-29 ENCOUNTER — Encounter: Payer: BC Managed Care – PPO | Admitting: *Deleted

## 2019-11-29 ENCOUNTER — Ambulatory Visit
Admission: RE | Admit: 2019-11-29 | Discharge: 2019-11-29 | Disposition: A | Discharge status: Home / Self Care | Payer: BC Managed Care – PPO | Source: Ambulatory Visit | Attending: Obstetrics and Gynecology | Admitting: Obstetrics and Gynecology

## 2019-11-29 ENCOUNTER — Encounter (HOSPITAL_COMMUNITY): Payer: Self-pay

## 2019-11-29 ENCOUNTER — Other Ambulatory Visit: Payer: Self-pay

## 2019-11-29 VITALS — Temp 97.8°F

## 2019-11-29 DIAGNOSIS — R928 Other abnormal and inconclusive findings on diagnostic imaging of breast: Secondary | ICD-10-CM

## 2019-11-29 DIAGNOSIS — R079 Chest pain, unspecified: Secondary | ICD-10-CM | POA: Diagnosis present

## 2019-11-29 DIAGNOSIS — Z006 Encounter for examination for normal comparison and control in clinical research program: Secondary | ICD-10-CM

## 2019-11-29 DIAGNOSIS — Z8249 Family history of ischemic heart disease and other diseases of the circulatory system: Secondary | ICD-10-CM | POA: Diagnosis present

## 2019-11-29 LAB — POCT I-STAT CREATININE: Creatinine, Ser: 1 mg/dL (ref 0.44–1.00)

## 2019-11-29 MED ORDER — NITROGLYCERIN 0.4 MG SL SUBL
SUBLINGUAL_TABLET | SUBLINGUAL | Status: AC
  Filled 2019-11-29: qty 2

## 2019-11-29 MED ORDER — IOHEXOL 350 MG/ML SOLN
80.0000 mL | Freq: Once | INTRAVENOUS | Status: AC | PRN
  Administered 2019-11-29: 80 mL via INTRAVENOUS

## 2019-11-29 MED ORDER — NITROGLYCERIN 0.4 MG SL SUBL
0.8000 mg | SUBLINGUAL_TABLET | Freq: Once | SUBLINGUAL | Status: AC
  Administered 2019-11-29: 0.8 mg via SUBLINGUAL

## 2019-11-29 NOTE — Research (Signed)
CADFEM Informed Consent   Subject Name: Caitlin Stark  Subject met inclusion and exclusion criteria.  The informed consent form, study requirements and expectations were reviewed with the subject and questions and concerns were addressed prior to the signing of the consent form.  The subject verbalized understanding of the trial requirements.  The subject agreed to participate in the CADFEM trial and signed the informed consent at 0705 on 11/29/2019.  The informed consent was obtained prior to performance of any protocol-specific procedures for the subject.  A copy of the signed informed consent was given to the subject and a copy was placed in the subject's medical record.   [name].

## 2019-12-02 ENCOUNTER — Other Ambulatory Visit: Payer: BC Managed Care – PPO

## 2020-04-02 ENCOUNTER — Ambulatory Visit: Payer: BC Managed Care – PPO | Admitting: Podiatry

## 2020-04-30 ENCOUNTER — Encounter (INDEPENDENT_AMBULATORY_CARE_PROVIDER_SITE_OTHER): Payer: Self-pay | Admitting: Family Medicine

## 2020-04-30 ENCOUNTER — Other Ambulatory Visit: Payer: Self-pay

## 2020-04-30 ENCOUNTER — Ambulatory Visit (INDEPENDENT_AMBULATORY_CARE_PROVIDER_SITE_OTHER): Payer: BC Managed Care – PPO | Admitting: Family Medicine

## 2020-04-30 VITALS — BP 116/70 | HR 58 | Temp 98.1°F | Ht 67.0 in | Wt 215.0 lb

## 2020-04-30 DIAGNOSIS — Z1331 Encounter for screening for depression: Secondary | ICD-10-CM | POA: Diagnosis not present

## 2020-04-30 DIAGNOSIS — Z9189 Other specified personal risk factors, not elsewhere classified: Secondary | ICD-10-CM

## 2020-04-30 DIAGNOSIS — R5383 Other fatigue: Secondary | ICD-10-CM

## 2020-04-30 DIAGNOSIS — I10 Essential (primary) hypertension: Secondary | ICD-10-CM

## 2020-04-30 DIAGNOSIS — R0602 Shortness of breath: Secondary | ICD-10-CM

## 2020-04-30 DIAGNOSIS — E669 Obesity, unspecified: Secondary | ICD-10-CM

## 2020-04-30 DIAGNOSIS — Z0289 Encounter for other administrative examinations: Secondary | ICD-10-CM

## 2020-04-30 DIAGNOSIS — Z6833 Body mass index (BMI) 33.0-33.9, adult: Secondary | ICD-10-CM

## 2020-04-30 NOTE — Progress Notes (Signed)
Chief Complaint:   OBESITY Caitlin Stark (MR# [id number]) is a 49 y.o. female who presents for evaluation and treatment of obesity and related comorbidities. Current BMI is Body mass index is 33.67 kg/m. Caitlin Stark has been struggling with her weight for many years and has been unsuccessful in either losing weight, maintaining weight loss, or reaching her healthy weight goal.  Caitlin Stark is returning to our clinic after 2 years and she notes some weight gain during the pandemic. She notes she is exercising regularly.  Caitlin Stark is currently in the action stage of change and ready to dedicate time achieving and maintaining a healthier weight. Caitlin Stark is interested in becoming our patient and working on intensive lifestyle modifications including (but not limited to) diet and exercise for weight loss.  Caitlin Stark's habits were reviewed today and are as follows: Her family eats meals together, she thinks her family will eat healthier with her, her desired weight loss is 45-55 lbs, she started gaining weight after children, her heaviest weight ever was 220 pounds, she has significant food cravings issues, she has problems with excessive hunger and she frequently eats larger portions than normal.  Depression Screen Caitlin Stark's Food and Mood (modified PHQ-9) score was 7.  Depression screen PHQ 2/9 04/30/2020  Decreased Interest 1  Down, Depressed, Hopeless 2  PHQ - 2 Score 3  Altered sleeping 0  Tired, decreased energy 1  Change in appetite 1  Feeling bad or failure about yourself  2  Trouble concentrating 0  Moving slowly or fidgety/restless 0  Suicidal thoughts 0  PHQ-9 Score 7  Difficult doing work/chores Not difficult at all   Subjective:   1. Other fatigue Caitlin Stark admits to daytime somnolence and denies waking up still tired. Patent has a history of symptoms of daytime fatigue. Caitlin Stark generally gets 6 or 8 hours of sleep per night, and states that she has generally restful sleep. Snoring is  not present. Apneic episodes are not present. Epworth Sleepiness Score is 5.  2. SOB (shortness of breath) on exertion Caitlin Stark notes increasing shortness of breath with exercising and seems to be worsening over time with weight gain. She notes getting out of breath sooner with activity than she used to. This has not gotten worse recently. Caitlin Stark denies shortness of breath at rest or orthopnea.  3. Essential hypertension Caitlin Stark's blood pressure is well controlled on her medications. She is working on diet and weight loss.  4. At risk of diabetes mellitus Caitlin Stark is at higher than average risk for developing diabetes due to her obesity.   Assessment/Plan:   1. Other fatigue Caitlin Stark does feel that her weight is causing her energy to be lower than it should be. Fatigue may be related to obesity, depression or many other causes. Labs will be ordered, and in the meanwhile, Caitlin Stark will focus on self care including making healthy food choices, increasing physical activity and focusing on stress reduction.  - EKG 12-Lead - CBC with Differential/Platelet - Comprehensive metabolic panel - Folate - Hemoglobin A1c - Insulin, random - Lipid Panel With LDL/HDL Ratio - VITAMIN D 25 Hydroxy (Vit-D Deficiency, Fractures) - Vitamin B12 - TSH - T4, free - T3  2. SOB (shortness of breath) on exertion [name] does feel that she gets out of breath more easily that she used to when she exercises. Caitlin Stark's shortness of breath appears to be obesity related and exercise induced. She has agreed to work on weight loss and gradually increase exercise to treat her exercise  induced shortness of breath. Will continue to monitor closely.  3. Essential hypertension Caitlin Stark will continue her medications, and will continue working on healthy weight loss, diet, and exercise to improve blood pressure control. We will watch for signs of hypotension as she continues her lifestyle modifications.  4. Depression screening Caitlin Stark  had a positive depression screening. Depression is commonly associated with obesity and often results in emotional eating behaviors. We will monitor this closely and work on CBT to help improve the non-hunger eating patterns. Referral to Psychology may be required if no improvement is seen as she continues in our clinic.  5. At risk of diabetes mellitus Caitlin Stark was given approximately 30 minutes of diabetes education and counseling today. We discussed intensive lifestyle modifications today with an emphasis on weight loss as well as increasing exercise and decreasing simple carbohydrates in her diet. We also reviewed medication options with an emphasis on risk versus benefit of those discussed.   Repetitive spaced learning was employed today to elicit superior memory formation and behavioral change.  6. Class 1 obesity with serious comorbidity and body mass index (BMI) of 33.0 to 33.9 in adult, unspecified obesity type Caitlin Stark is currently in the action stage of change and her goal is to continue with weight loss efforts. I recommend Caitlin Stark begin the structured treatment plan as follows:  She has agreed to keeping a food journal and adhering to recommended goals of 1200-1400 calories and 85+ grams of protein daily.  Exercise goals: No exercise has been prescribed for now, while we concentrate on nutritional changes.   Behavioral modification strategies: increasing lean protein intake.  She was informed of the importance of frequent follow-up visits to maximize her success with intensive lifestyle modifications for her multiple health conditions. She was informed we would discuss her lab results at her next visit unless there is a critical issue that needs to be addressed sooner. Caitlin Stark agreed to keep her next visit at the agreed upon time to discuss these results.  Objective:   Blood pressure 116/70, pulse (!) 58, temperature 98.1 F (36.7 C), temperature source Oral, height 5\' 7"  (1.702 m),  weight 215 lb (97.5 kg), last menstrual period 09/10/2012, SpO2 96 %. Body mass index is 33.67 kg/m.  EKG: Normal sinus rhythm, rate 65 BPM.  Indirect Calorimeter completed today shows a VO2 of 230 and a REE of 1604.  Her calculated basal metabolic rate is 2353 thus her basal metabolic rate is worse than expected.  General: Cooperative, alert, well developed, in no acute distress. HEENT: Conjunctivae and lids unremarkable. Cardiovascular: Regular rhythm.  Lungs: Normal work of breathing. Neurologic: No focal deficits.   Lab Results  Component Value Date   CREATININE 1.00 11/29/2019   BUN 19 09/04/2019   NA 140 09/04/2019   K 3.9 09/04/2019   CL 104 09/04/2019   CO2 26 09/04/2019   Lab Results  Component Value Date   ALT 14 01/02/2018   AST 16 01/02/2018   ALKPHOS 112 01/02/2018   BILITOT 0.4 01/02/2018   Lab Results  Component Value Date   HGBA1C 5.5 05/21/2018   HGBA1C 5.7 (H) 01/02/2018   HGBA1C 5.9 (H) 09/06/2017   Lab Results  Component Value Date   INSULIN 4.5 05/21/2018   INSULIN 10.1 01/02/2018   INSULIN 7.0 09/06/2017   Lab Results  Component Value Date   TSH 2.190 09/06/2017   Lab Results  Component Value Date   CHOL 171 09/06/2017   HDL 59 09/06/2017   Bell  98 09/06/2017   TRIG 69 09/06/2017   Lab Results  Component Value Date   WBC 10.8 (H) 09/04/2019   HGB 13.6 09/04/2019   HCT 43.2 09/04/2019   MCV 89.8 09/04/2019   PLT 390 09/04/2019   No results found for: IRON, TIBC, FERRITIN  Attestation Statements:   Reviewed by clinician on day of visit: allergies, medications, problem list, medical history, surgical history, family history, social history, and previous encounter notes.   I, Trixie Dredge, am acting as transcriptionist for Dennard Nip, MD.  I have reviewed the above documentation for accuracy and completeness, and I agree with the above. - Dennard Nip, MD

## 2020-05-01 LAB — CBC WITH DIFFERENTIAL/PLATELET
Basophils Absolute: 0.1 10*3/uL (ref 0.0–0.2)
Basos: 1 %
EOS (ABSOLUTE): 0.2 10*3/uL (ref 0.0–0.4)
Eos: 2 %
Hematocrit: 41.2 % (ref 34.0–46.6)
Hemoglobin: 13.3 g/dL (ref 11.1–15.9)
Immature Grans (Abs): 0 10*3/uL (ref 0.0–0.1)
Immature Granulocytes: 0 %
Lymphocytes Absolute: 2.4 10*3/uL (ref 0.7–3.1)
Lymphs: 31 %
MCH: 27.8 pg (ref 26.6–33.0)
MCHC: 32.3 g/dL (ref 31.5–35.7)
MCV: 86 fL (ref 79–97)
Monocytes Absolute: 0.7 10*3/uL (ref 0.1–0.9)
Monocytes: 9 %
Neutrophils Absolute: 4.5 10*3/uL (ref 1.4–7.0)
Neutrophils: 57 %
Platelets: 343 10*3/uL (ref 150–450)
RBC: 4.79 x10E6/uL (ref 3.77–5.28)
RDW: 14.4 % (ref 11.7–15.4)
WBC: 8 10*3/uL (ref 3.4–10.8)

## 2020-05-01 LAB — LIPID PANEL WITH LDL/HDL RATIO
Cholesterol, Total: 181 mg/dL (ref 100–199)
HDL: 53 mg/dL (ref >39)
LDL Chol Calc (NIH): 113 mg/dL — ABNORMAL HIGH (ref 0–99)
LDL/HDL Ratio: 2.1 ratio (ref 0.0–3.2)
Triglycerides: 80 mg/dL (ref 0–149)
VLDL Cholesterol Cal: 15 mg/dL (ref 5–40)

## 2020-05-01 LAB — HEMOGLOBIN A1C
Est. average glucose Bld gHb Est-mCnc: 120 mg/dL
Hgb A1c MFr Bld: 5.8 % — ABNORMAL HIGH (ref 4.8–5.6)

## 2020-05-01 LAB — COMPREHENSIVE METABOLIC PANEL
ALT: 19 IU/L (ref 0–32)
AST: 19 IU/L (ref 0–40)
Albumin/Globulin Ratio: 1.7 (ref 1.2–2.2)
Albumin: 4.8 g/dL (ref 3.8–4.8)
Alkaline Phosphatase: 107 IU/L (ref 48–121)
BUN/Creatinine Ratio: 14 (ref 9–23)
BUN: 12 mg/dL (ref 6–24)
Bilirubin Total: 0.3 mg/dL (ref 0.0–1.2)
CO2: 24 mmol/L (ref 20–29)
Calcium: 9.7 mg/dL (ref 8.7–10.2)
Chloride: 103 mmol/L (ref 96–106)
Creatinine, Ser: 0.87 mg/dL (ref 0.57–1.00)
GFR calc Af Amer: 90 mL/min/{1.73_m2} (ref >59)
GFR calc non Af Amer: 78 mL/min/{1.73_m2} (ref >59)
Globulin, Total: 2.8 g/dL (ref 1.5–4.5)
Glucose: 89 mg/dL (ref 65–99)
Potassium: 4.5 mmol/L (ref 3.5–5.2)
Sodium: 141 mmol/L (ref 134–144)
Total Protein: 7.6 g/dL (ref 6.0–8.5)

## 2020-05-01 LAB — T3: T3, Total: 122 ng/dL (ref 71–180)

## 2020-05-01 LAB — INSULIN, RANDOM: INSULIN: 12.3 u[IU]/mL (ref 2.6–24.9)

## 2020-05-01 LAB — VITAMIN B12: Vitamin B-12: 789 pg/mL (ref 232–1245)

## 2020-05-01 LAB — VITAMIN D 25 HYDROXY (VIT D DEFICIENCY, FRACTURES): Vit D, 25-Hydroxy: 41.1 ng/mL (ref 30.0–100.0)

## 2020-05-01 LAB — TSH: TSH: 2.03 u[IU]/mL (ref 0.450–4.500)

## 2020-05-01 LAB — FOLATE: Folate: >20 ng/mL (ref >3.0)

## 2020-05-01 LAB — T4, FREE: Free T4: 1.15 ng/dL (ref 0.82–1.77)

## 2020-05-14 ENCOUNTER — Encounter (INDEPENDENT_AMBULATORY_CARE_PROVIDER_SITE_OTHER): Payer: Self-pay | Admitting: Family Medicine

## 2020-05-14 ENCOUNTER — Other Ambulatory Visit: Payer: Self-pay

## 2020-05-14 ENCOUNTER — Ambulatory Visit (INDEPENDENT_AMBULATORY_CARE_PROVIDER_SITE_OTHER): Payer: BC Managed Care – PPO | Admitting: Family Medicine

## 2020-05-14 VITALS — BP 126/72 | HR 64 | Temp 98.0°F | Ht 67.0 in | Wt 216.0 lb

## 2020-05-14 DIAGNOSIS — Z6834 Body mass index (BMI) 34.0-34.9, adult: Secondary | ICD-10-CM | POA: Diagnosis not present

## 2020-05-14 DIAGNOSIS — R7303 Prediabetes: Secondary | ICD-10-CM | POA: Diagnosis not present

## 2020-05-14 DIAGNOSIS — E669 Obesity, unspecified: Secondary | ICD-10-CM | POA: Diagnosis not present

## 2020-05-14 DIAGNOSIS — Z9189 Other specified personal risk factors, not elsewhere classified: Secondary | ICD-10-CM

## 2020-05-14 DIAGNOSIS — E559 Vitamin D deficiency, unspecified: Secondary | ICD-10-CM | POA: Diagnosis not present

## 2020-05-14 MED ORDER — METFORMIN HCL 500 MG PO TABS: ORAL_TABLET | ORAL | 0 refills | Status: DC

## 2020-05-14 MED ORDER — VITAMIN D (ERGOCALCIFEROL) 1.25 MG (50000 UNIT) PO CAPS: 50000.0000 [IU] | ORAL_CAPSULE | ORAL | 0 refills | Status: DC

## 2020-05-15 NOTE — Progress Notes (Signed)
Chief Complaint:   OBESITY Caitlin Stark is here to discuss her progress with her obesity treatment plan along with follow-up of her obesity related diagnoses. Caitlin Stark is on keeping a food journal and adhering to recommended goals of 1200-1400 calories and 85+ grams of protein daily and states she is following her eating plan approximately 100% of the time. Caitlin Stark states she is doing 0 minutes 0 times per week.  Today's visit was #: 2 Starting weight: 215 lbs Starting date: 04/30/2020 Today's weight: 216 lbs Today's date: 05/14/2020 Total lbs lost to date: 0 Total lbs lost since last in-office visit: 0  Interim History: Caitlin Stark did well with journaling and she felt she met her calorie goal most days, but she was short on protein. She has taken a short break from exercise but she is ready to get back on track.  Subjective:   1. Pre-diabetes Caitlin Stark's A1c is elevated at 5.8, and she is taking metformin once daily. She had GI issues previously when she tried to increase. I discussed labs with the patient today.  2. Vitamin D deficiency Caitlin Stark's Vit D level is not yet at goal. She denies nausea or vomiting. I discussed labs with the patient today.  3. At risk for diabetes mellitus Caitlin Stark is at higher than average risk for developing diabetes due to her obesity.   Assessment/Plan:   1. Pre-diabetes Caitlin Stark will continue to work on weight loss, exercise, and decreasing simple carbohydrates to help decrease the risk of diabetes. We will refill metformin for 1 month (she is to take 1/2 tablet in the afternoon to start).  - metFORMIN (GLUCOPHAGE) 500 MG tablet; TAKE  (1)  TABLET TWICE A DAY WITH MEALS (BREAKFAST AND SUPPER)  Dispense: 60 tablet; Refill: 0  2. Vitamin D deficiency Low Vitamin D level contributes to fatigue and are associated with obesity, breast, and colon cancer. Caitlin Stark agreed to start prescription Vitamin D 50,000 IU every week with no refills. She will follow-up for routine  testing of Vitamin D, at least 2-3 times per year to avoid over-replacement.  - Vitamin D, Ergocalciferol, (DRISDOL) 1.25 MG (50000 UNIT) CAPS capsule; Take 1 capsule (50,000 Units total) by mouth every 7 (seven) days.  Dispense: 4 capsule; Refill: 0  3. At risk for diabetes mellitus Caitlin Stark was given approximately 30 minutes of diabetes education and counseling today. We discussed intensive lifestyle modifications today with an emphasis on weight loss as well as increasing exercise and decreasing simple carbohydrates in her diet. We also reviewed medication options with an emphasis on risk versus benefit of those discussed.   Repetitive spaced learning was employed today to elicit superior memory formation and behavioral change.  4. Class 1 obesity with serious comorbidity and body mass index (BMI) of 34.0 to 34.9 in adult, unspecified obesity type Caitlin Stark is currently in the action stage of change. As such, her goal is to continue with weight loss efforts. She has agreed to keeping a food journal and adhering to recommended goals of 1200-1400 calories and 85+ grams of protein daily.   Behavioral modification strategies: increasing lean protein intake and meal planning and cooking strategies.  Caitlin Stark has agreed to follow-up with our clinic in 2 to 3 weeks. She was informed of the importance of frequent follow-up visits to maximize her success with intensive lifestyle modifications for her multiple health conditions.   Objective:   Blood pressure 126/72, pulse 64, temperature 98 F (36.7 C), height 5' 7" (1.702 m), weight 216 lb (98  kg), last menstrual period 09/10/2012, SpO2 98 %. Body mass index is 33.83 kg/m.  General: Cooperative, alert, well developed, in no acute distress. HEENT: Conjunctivae and lids unremarkable. Cardiovascular: Regular rhythm.  Lungs: Normal work of breathing. Neurologic: No focal deficits.   Lab Results  Component Value Date   CREATININE 0.87 04/30/2020   BUN  12 04/30/2020   NA 141 04/30/2020   K 4.5 04/30/2020   CL 103 04/30/2020   CO2 24 04/30/2020   Lab Results  Component Value Date   ALT 19 04/30/2020   AST 19 04/30/2020   ALKPHOS 107 04/30/2020   BILITOT 0.3 04/30/2020   Lab Results  Component Value Date   HGBA1C 5.8 (H) 04/30/2020   HGBA1C 5.5 05/21/2018   HGBA1C 5.7 (H) 01/02/2018   HGBA1C 5.9 (H) 09/06/2017   Lab Results  Component Value Date   INSULIN 12.3 04/30/2020   INSULIN 4.5 05/21/2018   INSULIN 10.1 01/02/2018   INSULIN 7.0 09/06/2017   Lab Results  Component Value Date   TSH 2.030 04/30/2020   Lab Results  Component Value Date   CHOL 181 04/30/2020   HDL 53 04/30/2020   LDLCALC 113 (H) 04/30/2020   TRIG 80 04/30/2020   Lab Results  Component Value Date   WBC 8.0 04/30/2020   HGB 13.3 04/30/2020   HCT 41.2 04/30/2020   MCV 86 04/30/2020   PLT 343 04/30/2020   No results found for: IRON, TIBC, FERRITIN  Attestation Statements:   Reviewed by clinician on day of visit: allergies, medications, problem list, medical history, surgical history, family history, social history, and previous encounter notes.   I, Trixie Dredge, am acting as transcriptionist for Dennard Nip, MD.  I have reviewed the above documentation for accuracy and completeness, and I agree with the above. -  Dennard Nip, MD

## 2020-06-02 ENCOUNTER — Encounter (INDEPENDENT_AMBULATORY_CARE_PROVIDER_SITE_OTHER): Payer: Self-pay | Admitting: Family Medicine

## 2020-06-02 ENCOUNTER — Other Ambulatory Visit: Payer: Self-pay

## 2020-06-02 ENCOUNTER — Ambulatory Visit (INDEPENDENT_AMBULATORY_CARE_PROVIDER_SITE_OTHER): Payer: BC Managed Care – PPO | Admitting: Family Medicine

## 2020-06-02 VITALS — BP 125/71 | HR 74 | Temp 98.1°F | Ht 67.0 in | Wt 220.0 lb

## 2020-06-02 DIAGNOSIS — Z9189 Other specified personal risk factors, not elsewhere classified: Secondary | ICD-10-CM | POA: Diagnosis not present

## 2020-06-02 DIAGNOSIS — R7303 Prediabetes: Secondary | ICD-10-CM

## 2020-06-02 DIAGNOSIS — E559 Vitamin D deficiency, unspecified: Secondary | ICD-10-CM | POA: Diagnosis not present

## 2020-06-02 DIAGNOSIS — E669 Obesity, unspecified: Secondary | ICD-10-CM

## 2020-06-02 DIAGNOSIS — Z6834 Body mass index (BMI) 34.0-34.9, adult: Secondary | ICD-10-CM | POA: Diagnosis not present

## 2020-06-03 MED ORDER — VITAMIN D (ERGOCALCIFEROL) 1.25 MG (50000 UNIT) PO CAPS: 50000.0000 [IU] | ORAL_CAPSULE | ORAL | 0 refills | Status: DC

## 2020-06-03 MED ORDER — SAXENDA 18 MG/3ML ~~LOC~~ SOPN: 3.0000 mg | PEN_INJECTOR | Freq: Every day | SUBCUTANEOUS | 0 refills | Status: DC

## 2020-06-03 NOTE — Progress Notes (Signed)
Chief Complaint:   OBESITY Caitlin Stark is here to discuss her progress with her obesity treatment plan along with follow-up of her obesity related diagnoses. Caitlin Stark is on keeping a food journal and adhering to recommended goals of 1200-1400 calories and 85+ grams of protein daily and states she is following her eating plan approximately 85% of the time. Caitlin Stark states she is at the gym 2 days per week, and walking for 60 minutes 4 times per week.  Today's visit was #: 3 Starting weight: 215 lbs Starting date: 04/30/2020 Today's weight: 220 lbs Today's date: 06/02/2020 Total lbs lost to date: 0 Total lbs lost since last in-office visit: 0  Interim History: Caitlin Stark did some celebrations eating over her birthday and she has gained some weight. She is frustrated with her lack of overall weight loss. She would like to hear more about GLP-1 medication options.  Subjective:   1. Pre-diabetes Caitlin Stark's metformin was increased to 1 tablet PO q AM and 1/2 tablet PO, and she has significant GI upset. She has tolerated 1 tablet PO q daily well previously.  2. Vitamin D deficiency Caitlin Stark is stable on Vit D, but her level is not yet at goal.  3. At risk for impaired metabolic function Caitlin Stark is at increased risk for impaired metabolic function due to current nutrition and muscle mass.  Assessment/Plan:   1. Pre-diabetes Caitlin Stark will continue to work on weight loss, exercise, and decreasing simple carbohydrates to help decrease the risk of diabetes. Caitlin Stark agreed to decrease metformin to 500 mg q daily, no refill needed.  2. Vitamin D deficiency Low Vitamin D level contributes to fatigue and are associated with obesity, breast, and colon cancer. We will refill prescription Vitamin D for 1 month. Caitlin Stark will follow-up for routine testing of Vitamin D, at least 2-3 times per year to avoid over-replacement.  - Vitamin D, Ergocalciferol, (DRISDOL) 1.25 MG (50000 UNIT) CAPS capsule; Take 1 capsule (50,000  Units total) by mouth every 7 (seven) days.  Dispense: 4 capsule; Refill: 0  3. At risk for impaired metabolic function Caitlin Stark was given approximately 15 minutes of impaired  metabolic function prevention counseling today. We discussed intensive lifestyle modifications today with an emphasis on specific nutrition and exercise instructions and strategies.   Repetitive spaced learning was employed today to elicit superior memory formation and behavioral change.  4. Class 1 obesity with serious comorbidity and body mass index (BMI) of 34.0 to 34.9 in adult, unspecified obesity type Caitlin Stark is currently in the action stage of change. As such, her goal is to continue with weight loss efforts. She has agreed to keeping a food journal and adhering to recommended goals of 1200-1400 calories and 80+ grams of protein daily.   We discussed various medication options to help Caitlin Stark with her weight loss efforts and we both agreed to start Saxenda 3.0 mg SubQ daily with no refills. (She is to start at 0.6 mg q daily).  - Liraglutide -Weight Management (SAXENDA) 18 MG/3ML SOPN; Inject 3 mg into the skin daily. #5 pens  Dispense: 15 mL; Refill: 0  Exercise goals: As is.  Behavioral modification strategies: increasing lean protein intake and keeping a strict food journal.  Caitlin Stark has agreed to follow-up with our clinic in 2 to 3 weeks. She was informed of the importance of frequent follow-up visits to maximize her success with intensive lifestyle modifications for her multiple health conditions.   Objective:   Blood pressure 125/71, pulse 74, temperature 98.1 F (  36.7 C), height 5\' 7"  (1.702 m), weight 220 lb (99.8 kg), last menstrual period 09/10/2012, SpO2 97 %. Body mass index is 34.46 kg/m.  General: Cooperative, alert, well developed, in no acute distress. HEENT: Conjunctivae and lids unremarkable. Cardiovascular: Regular rhythm.  Lungs: Normal work of breathing. Neurologic: No focal deficits.    Lab Results  Component Value Date   CREATININE 0.87 04/30/2020   BUN 12 04/30/2020   NA 141 04/30/2020   K 4.5 04/30/2020   CL 103 04/30/2020   CO2 24 04/30/2020   Lab Results  Component Value Date   ALT 19 04/30/2020   AST 19 04/30/2020   ALKPHOS 107 04/30/2020   BILITOT 0.3 04/30/2020   Lab Results  Component Value Date   HGBA1C 5.8 (H) 04/30/2020   HGBA1C 5.5 05/21/2018   HGBA1C 5.7 (H) 01/02/2018   HGBA1C 5.9 (H) 09/06/2017   Lab Results  Component Value Date   INSULIN 12.3 04/30/2020   INSULIN 4.5 05/21/2018   INSULIN 10.1 01/02/2018   INSULIN 7.0 09/06/2017   Lab Results  Component Value Date   TSH 2.030 04/30/2020   Lab Results  Component Value Date   CHOL 181 04/30/2020   HDL 53 04/30/2020   LDLCALC 113 (H) 04/30/2020   TRIG 80 04/30/2020   Lab Results  Component Value Date   WBC 8.0 04/30/2020   HGB 13.3 04/30/2020   HCT 41.2 04/30/2020   MCV 86 04/30/2020   PLT 343 04/30/2020   No results found for: IRON, TIBC, FERRITIN  Attestation Statements:   Reviewed by clinician on day of visit: allergies, medications, problem list, medical history, surgical history, family history, social history, and previous encounter notes.   I, Trixie Dredge, am acting as transcriptionist for Dennard Nip, MD.  I have reviewed the above documentation for accuracy and completeness, and I agree with the above. -  Dennard Nip, MD

## 2020-06-04 ENCOUNTER — Telehealth (INDEPENDENT_AMBULATORY_CARE_PROVIDER_SITE_OTHER): Payer: Self-pay

## 2020-06-04 NOTE — Telephone Encounter (Signed)
Prior auth for saxenda has been started: Response from Cover My Meds dashboard:    Your information has been submitted to [place]. To check for an updated outcome later, reopen this PA request from your dashboard.  If [employer] has not responded to your request within 24 hours, contact [place] at [phone]. If you think there may be a problem with your PA request, use our live chat feature at the bottom right.

## 2020-06-15 ENCOUNTER — Encounter (INDEPENDENT_AMBULATORY_CARE_PROVIDER_SITE_OTHER): Payer: Self-pay

## 2020-06-15 NOTE — Telephone Encounter (Signed)
PA approved through  10/04/2020, pt notified through [facility]

## 2020-06-22 ENCOUNTER — Ambulatory Visit (INDEPENDENT_AMBULATORY_CARE_PROVIDER_SITE_OTHER): Payer: BC Managed Care – PPO | Admitting: Family Medicine

## 2020-06-22 ENCOUNTER — Encounter (INDEPENDENT_AMBULATORY_CARE_PROVIDER_SITE_OTHER): Payer: Self-pay | Admitting: Family Medicine

## 2020-06-22 ENCOUNTER — Other Ambulatory Visit: Payer: Self-pay

## 2020-06-22 VITALS — BP 114/67 | HR 67 | Temp 98.6°F | Ht 67.0 in | Wt 215.0 lb

## 2020-06-22 DIAGNOSIS — E669 Obesity, unspecified: Secondary | ICD-10-CM

## 2020-06-22 DIAGNOSIS — I10 Essential (primary) hypertension: Secondary | ICD-10-CM

## 2020-06-22 DIAGNOSIS — Z9189 Other specified personal risk factors, not elsewhere classified: Secondary | ICD-10-CM | POA: Diagnosis not present

## 2020-06-22 DIAGNOSIS — R7303 Prediabetes: Secondary | ICD-10-CM | POA: Diagnosis not present

## 2020-06-22 DIAGNOSIS — Z6833 Body mass index (BMI) 33.0-33.9, adult: Secondary | ICD-10-CM | POA: Diagnosis not present

## 2020-06-30 NOTE — Progress Notes (Signed)
Chief Complaint:   OBESITY Caitlin Stark is here to discuss her progress with her obesity treatment plan along with follow-up of her obesity related diagnoses. [name] is on keeping a food journal and adhering to recommended goals of 1200-1400 calories and 80+ grams of protein daily and states she is following her eating plan approximately 85-90% of the time. [name] states she is doing orange theory and walking for 30-60 minutes 2-3 times per week.  Today's visit was #: 4 Starting weight: 215 lbs Starting date: 04/30/2020 Today's weight: 215 lbs Today's date: 06/22/2020 Total lbs lost to date: 0 Total lbs lost since last in-office visit: 5  Interim History: Brad has done well with weight loss. She started [place] and had mild nausea and some constipation, but this has improved now. Her hunger is much better controlled and she is happy with her weight loss. Her calories are at goal but her protein is a bit low.  Subjective:   1. Pre-diabetes [name] started Saxenda and stopped her metformin. She notes decreased polyphagia and has done better with weight loss. She is not showing any signs of hypoglycemia.  2. Essential hypertension [name]'s blood pressure is well controlled on her medications. No hypotension is noted with adding Saxenda, and she is doing well with diet and weight loss.  3. At risk for hypoglycemia [name] is at increased risk for hypoglycemia due to changes in diet, diagnosis of diabetes, and/or insulin use.   Assessment/Plan:   1. Pre-diabetes [name] will continue to work on weight loss, exercise, and decreasing simple carbohydrates to help decrease the risk of diabetes. [name] is ok to discontinue metformin, and will continue Saxenda at her current dose and will follow closely.  2. Essential hypertension [name] is working on healthy weight loss, diet, and exercise to improve blood pressure control. We will watch for signs of hypotension as she continues her lifestyle  modifications.  3. At risk for hypoglycemia [name] was given approximately 15 minutes of counseling today regarding prevention of hypoglycemia. She was advised of symptoms of hypoglycemia. [name] was instructed to avoid skipping meals, eat regular protein rich meals and schedule low calorie snacks as needed.   Repetitive spaced learning was employed today to elicit superior memory formation and behavioral change  4. Class 1 obesity with serious comorbidity and body mass index (BMI) of 33.0 to 33.9 in adult, unspecified obesity type [name] is currently in the action stage of change. As such, her goal is to continue with weight loss efforts. She has agreed to keeping a food journal and adhering to recommended goals of 1200-1400 calories and 80+ grams of protein daily.   Exercise goals: As is.  Behavioral modification strategies: increasing lean protein intake and holiday eating strategies .  [name] has agreed to follow-up with our clinic in 2 to 3 weeks. She was informed of the importance of frequent follow-up visits to maximize her success with intensive lifestyle modifications for her multiple health conditions.   Objective:   Blood pressure 114/67, pulse 67, temperature 98.6 F (37 C), height 5\' 7"  (1.702 m), weight 215 lb (97.5 kg), last menstrual period 09/10/2012, SpO2 99 %. Body mass index is 33.67 kg/m.  General: Cooperative, alert, well developed, in no acute distress. HEENT: Conjunctivae and lids unremarkable. Cardiovascular: Regular rhythm.  Lungs: Normal work of breathing. Neurologic: No focal deficits.   Lab Results  Component Value Date   CREATININE 0.87 04/30/2020   BUN 12 04/30/2020   NA 141 04/30/2020   K  4.5 04/30/2020   CL 103 04/30/2020   CO2 24 04/30/2020   Lab Results  Component Value Date   ALT 19 04/30/2020   AST 19 04/30/2020   ALKPHOS 107 04/30/2020   BILITOT 0.3 04/30/2020   Lab Results  Component Value Date   HGBA1C 5.8 (H) 04/30/2020   HGBA1C  5.5 05/21/2018   HGBA1C 5.7 (H) 01/02/2018   HGBA1C 5.9 (H) 09/06/2017   Lab Results  Component Value Date   INSULIN 12.3 04/30/2020   INSULIN 4.5 05/21/2018   INSULIN 10.1 01/02/2018   INSULIN 7.0 09/06/2017   Lab Results  Component Value Date   TSH 2.030 04/30/2020   Lab Results  Component Value Date   CHOL 181 04/30/2020   HDL 53 04/30/2020   LDLCALC 113 (H) 04/30/2020   TRIG 80 04/30/2020   Lab Results  Component Value Date   WBC 8.0 04/30/2020   HGB 13.3 04/30/2020   HCT 41.2 04/30/2020   MCV 86 04/30/2020   PLT 343 04/30/2020   No results found for: IRON, TIBC, FERRITIN  Attestation Statements:   Reviewed by clinician on day of visit: allergies, medications, problem list, medical history, surgical history, family history, social history, and previous encounter notes.   I, Trixie Dredge, am acting as transcriptionist for Dennard Nip, MD.  I have reviewed the above documentation for accuracy and completeness, and I agree with the above. -  Dennard Nip, MD

## 2020-07-08 ENCOUNTER — Encounter (INDEPENDENT_AMBULATORY_CARE_PROVIDER_SITE_OTHER): Payer: Self-pay | Admitting: Family Medicine

## 2020-07-08 ENCOUNTER — Ambulatory Visit (INDEPENDENT_AMBULATORY_CARE_PROVIDER_SITE_OTHER): Payer: BC Managed Care – PPO | Admitting: Family Medicine

## 2020-07-08 ENCOUNTER — Other Ambulatory Visit: Payer: Self-pay

## 2020-07-08 VITALS — BP 128/77 | HR 66 | Temp 98.1°F | Ht 67.0 in | Wt 215.0 lb

## 2020-07-08 DIAGNOSIS — Z9189 Other specified personal risk factors, not elsewhere classified: Secondary | ICD-10-CM

## 2020-07-08 DIAGNOSIS — Z6833 Body mass index (BMI) 33.0-33.9, adult: Secondary | ICD-10-CM

## 2020-07-08 DIAGNOSIS — R7303 Prediabetes: Secondary | ICD-10-CM | POA: Diagnosis not present

## 2020-07-08 DIAGNOSIS — E782 Mixed hyperlipidemia: Secondary | ICD-10-CM | POA: Diagnosis not present

## 2020-07-08 DIAGNOSIS — E669 Obesity, unspecified: Secondary | ICD-10-CM | POA: Diagnosis not present

## 2020-07-08 DIAGNOSIS — E559 Vitamin D deficiency, unspecified: Secondary | ICD-10-CM

## 2020-07-09 MED ORDER — VITAMIN D (ERGOCALCIFEROL) 1.25 MG (50000 UNIT) PO CAPS: 50000.0000 [IU] | ORAL_CAPSULE | ORAL | 0 refills | Status: DC

## 2020-07-09 NOTE — Progress Notes (Signed)
Chief Complaint:   OBESITY Caitlin Stark is here to discuss her progress with her obesity treatment plan along with follow-up of her obesity related diagnoses. Caitlin Stark is on keeping a food journal and adhering to recommended goals of 1200-1400 calories and 80+ grams of protein daily and states she is following her eating plan approximately 85% of the time. Caitlin Stark states she is doing [employer] for 60 minutes 2 times per week.  Today's visit was #: 5 Starting weight: 215 lbs Starting date: 04/30/2020 Today's weight: 215 lbs Today's date: 07/08/2020 Total lbs lost to date: 0 Total lbs lost since last in-office visit: 0  Interim History: Caitlin Stark has done well with maintaining her weight even on vacation. She started [place] and she had some initial GI upset but this has resolved.  Subjective:   1. Mixed hyperlipidemia Caitlin Stark's last LDL was elevated, and she is working on diet and she is on GLP-1 now.  2. Pre-diabetes Caitlin Stark's last A1c was elevated at 5.8. She is now on GLP-1 and she is working on weight loss.  3. Vitamin D deficiency Caitlin Stark is stable on Vit D, but her level is not yet at goal. She denies nausea or vomiting.  4. At risk for impaired metabolic function Caitlin Stark is at increased risk for impaired metabolic function if not meeting calories or protein goals.  Assessment/Plan:   1. Mixed hyperlipidemia Cardiovascular risk and specific lipid/LDL goals reviewed.  We discussed several lifestyle modifications today. Venus will continue to work on diet, exercise and weight loss efforts. We will recheck labs in 1 month. Orders and follow up as documented in patient record.   2. Pre-diabetes Caitlin Stark will continue to work on weight loss, diet, exercise, and decreasing simple carbohydrates to help decrease the risk of diabetes. We will recheck labs in 1 month.  3. Vitamin D deficiency Low Vitamin D level contributes to fatigue and are associated with obesity, breast, and colon  cancer. We will refill prescription Vitamin D for 1 month, and we will recheck labs in 1 month. Caitlin Stark will follow-up for routine testing of Vitamin D, at least 2-3 times per year to avoid over-replacement.  - Vitamin D, Ergocalciferol, (DRISDOL) 1.25 MG (50000 UNIT) CAPS capsule; Take 1 capsule (50,000 Units total) by mouth every 7 (seven) days.  Dispense: 4 capsule; Refill: 0  4. At risk for impaired metabolic function Caitlin Stark was given approximately 15 minutes of impaired  metabolic function prevention counseling today. We discussed intensive lifestyle modifications today with an emphasis on specific nutrition and exercise instructions and strategies.   Repetitive spaced learning was employed today to elicit superior memory formation and behavioral change.  5. Class 1 obesity with serious comorbidity and body mass index (BMI) of 33.0 to 33.9 in adult, unspecified obesity type Caitlin Stark is currently in the action stage of change. As such, her goal is to continue with weight loss efforts. She has agreed to keeping a food journal and adhering to recommended goals of 1400 calories and 80+ grams of protein daily.   We discussed various medication options to help Caitlin Stark with her weight loss efforts and we both agreed that it is ok to increase Saxenda to 1.2 mg or stay at 0.6 mg of Saxenda.  Exercise goals: As is.  Behavioral modification strategies: increasing lean protein intake, meal planning and cooking strategies and keeping a strict food journal.  Caitlin Stark has agreed to follow-up with our clinic in 3 weeks. She was informed of the importance of frequent follow-up  visits to maximize her success with intensive lifestyle modifications for her multiple health conditions.   Objective:   Blood pressure 128/77, pulse 66, temperature 98.1 F (36.7 C), height 5\' 7"  (1.702 m), weight 215 lb (97.5 kg), last menstrual period 09/10/2012, SpO2 98 %. Body mass index is 33.67 kg/m.  General: Cooperative,  alert, well developed, in no acute distress. HEENT: Conjunctivae and lids unremarkable. Cardiovascular: Regular rhythm.  Lungs: Normal work of breathing. Neurologic: No focal deficits.   Lab Results  Component Value Date   CREATININE 0.87 04/30/2020   BUN 12 04/30/2020   NA 141 04/30/2020   K 4.5 04/30/2020   CL 103 04/30/2020   CO2 24 04/30/2020   Lab Results  Component Value Date   ALT 19 04/30/2020   AST 19 04/30/2020   ALKPHOS 107 04/30/2020   BILITOT 0.3 04/30/2020   Lab Results  Component Value Date   HGBA1C 5.8 (H) 04/30/2020   HGBA1C 5.5 05/21/2018   HGBA1C 5.7 (H) 01/02/2018   HGBA1C 5.9 (H) 09/06/2017   Lab Results  Component Value Date   INSULIN 12.3 04/30/2020   INSULIN 4.5 05/21/2018   INSULIN 10.1 01/02/2018   INSULIN 7.0 09/06/2017   Lab Results  Component Value Date   TSH 2.030 04/30/2020   Lab Results  Component Value Date   CHOL 181 04/30/2020   HDL 53 04/30/2020   LDLCALC 113 (H) 04/30/2020   TRIG 80 04/30/2020   Lab Results  Component Value Date   WBC 8.0 04/30/2020   HGB 13.3 04/30/2020   HCT 41.2 04/30/2020   MCV 86 04/30/2020   PLT 343 04/30/2020   No results found for: IRON, TIBC, FERRITIN  Attestation Statements:   Reviewed by clinician on day of visit: allergies, medications, problem list, medical history, surgical history, family history, social history, and previous encounter notes.   I, Trixie Dredge, am acting as transcriptionist for Dennard Nip, MD.  I have reviewed the above documentation for accuracy and completeness, and I agree with the above. -  Dennard Nip, MD

## 2020-07-29 ENCOUNTER — Ambulatory Visit (INDEPENDENT_AMBULATORY_CARE_PROVIDER_SITE_OTHER): Payer: BC Managed Care – PPO | Admitting: Family Medicine

## 2020-07-29 ENCOUNTER — Other Ambulatory Visit: Payer: Self-pay

## 2020-07-29 ENCOUNTER — Encounter (INDEPENDENT_AMBULATORY_CARE_PROVIDER_SITE_OTHER): Payer: Self-pay | Admitting: Family Medicine

## 2020-07-29 VITALS — BP 120/75 | HR 65 | Temp 98.1°F | Ht 67.0 in | Wt 214.0 lb

## 2020-07-29 DIAGNOSIS — Z6833 Body mass index (BMI) 33.0-33.9, adult: Secondary | ICD-10-CM

## 2020-07-29 DIAGNOSIS — E669 Obesity, unspecified: Secondary | ICD-10-CM | POA: Diagnosis not present

## 2020-07-29 DIAGNOSIS — I10 Essential (primary) hypertension: Secondary | ICD-10-CM

## 2020-07-29 NOTE — Progress Notes (Signed)
Chief Complaint:   OBESITY Caitlin Stark is here to discuss her progress with her obesity treatment plan along with follow-up of her obesity related diagnoses. Caitlin Stark is on keeping a food journal and adhering to recommended goals of 1400 calories and 80+ grams of protein daily and states she is following her eating plan approximately 85% of the time. Caitlin Stark states she is doing [employer] for 60 minutes 3 times per week.  Today's visit was #: 6 Starting weight: 215 lbs Starting date: 04/30/2020 Today's weight: 214 lbs Today's date: 07/29/2020 Total lbs lost to date: 1 Total lbs lost since last in-office visit: 1  Interim History: Caitlin Stark is stable on Saxenda. She didn't increase her dose to 1.2 mg yet but she notes her hunger has improved at 0.6 mg. She isn't concerned about Thanksgiving weight gain, and she is making good choices overall.  Subjective:   1. Essential hypertension Caitlin Stark's blood pressure is well controlled with her medications, diet, and weight loss. She denies signs of hypotension.  Assessment/Plan:   1. Essential hypertension Caitlin Stark will continue with diet, exercise, and healthy weight loss to improve blood pressure control. We will watch for signs of hypotension as she continues her lifestyle modifications. We will plan to recheck labs in 1 month.  2. Class 1 obesity with serious comorbidity and body mass index (BMI) of 33.0 to 33.9 in adult, unspecified obesity type Caitlin Stark is currently in the action stage of change. As such, her goal is to continue with weight loss efforts. She has agreed to keeping a food journal and adhering to recommended goals of 1200-1400 calories and 80+ grams of protein daily.   We will recheck IC at her next visit.  Exercise goals: As is.  Behavioral modification strategies: holiday eating strategies .  Caitlin Stark has agreed to follow-up with our clinic in 3 to 4 weeks. She was informed of the importance of frequent follow-up visits to  maximize her success with intensive lifestyle modifications for her multiple health conditions.   Objective:   Blood pressure 120/75, pulse 65, temperature 98.1 F (36.7 C), height 5\' 7"  (1.702 m), weight 214 lb (97.1 kg), last menstrual period 09/10/2012, SpO2 97 %. Body mass index is 33.52 kg/m.  General: Cooperative, alert, well developed, in no acute distress. HEENT: Conjunctivae and lids unremarkable. Cardiovascular: Regular rhythm.  Lungs: Normal work of breathing. Neurologic: No focal deficits.   Lab Results  Component Value Date   CREATININE 0.87 04/30/2020   BUN 12 04/30/2020   NA 141 04/30/2020   K 4.5 04/30/2020   CL 103 04/30/2020   CO2 24 04/30/2020   Lab Results  Component Value Date   ALT 19 04/30/2020   AST 19 04/30/2020   ALKPHOS 107 04/30/2020   BILITOT 0.3 04/30/2020   Lab Results  Component Value Date   HGBA1C 5.8 (H) 04/30/2020   HGBA1C 5.5 05/21/2018   HGBA1C 5.7 (H) 01/02/2018   HGBA1C 5.9 (H) 09/06/2017   Lab Results  Component Value Date   INSULIN 12.3 04/30/2020   INSULIN 4.5 05/21/2018   INSULIN 10.1 01/02/2018   INSULIN 7.0 09/06/2017   Lab Results  Component Value Date   TSH 2.030 04/30/2020   Lab Results  Component Value Date   CHOL 181 04/30/2020   HDL 53 04/30/2020   LDLCALC 113 (H) 04/30/2020   TRIG 80 04/30/2020   Lab Results  Component Value Date   WBC 8.0 04/30/2020   HGB 13.3 04/30/2020   HCT 41.2  04/30/2020   MCV 86 04/30/2020   PLT 343 04/30/2020   No results found for: IRON, TIBC, FERRITIN  Attestation Statements:   Reviewed by clinician on day of visit: allergies, medications, problem list, medical history, surgical history, family history, social history, and previous encounter notes.   I, Trixie Dredge, am acting as transcriptionist for Dennard Nip, MD.  I have reviewed the above documentation for accuracy and completeness, and I agree with the above. -  Dennard Nip, MD

## 2020-08-25 ENCOUNTER — Ambulatory Visit (INDEPENDENT_AMBULATORY_CARE_PROVIDER_SITE_OTHER): Payer: BC Managed Care – PPO | Admitting: Family Medicine

## 2020-08-25 ENCOUNTER — Encounter (INDEPENDENT_AMBULATORY_CARE_PROVIDER_SITE_OTHER): Payer: Self-pay | Admitting: Family Medicine

## 2020-08-25 ENCOUNTER — Other Ambulatory Visit: Payer: Self-pay

## 2020-08-25 VITALS — BP 112/69 | HR 71 | Temp 98.0°F | Ht 67.0 in | Wt 212.0 lb

## 2020-08-25 DIAGNOSIS — Z9189 Other specified personal risk factors, not elsewhere classified: Secondary | ICD-10-CM

## 2020-08-25 DIAGNOSIS — E559 Vitamin D deficiency, unspecified: Secondary | ICD-10-CM | POA: Diagnosis not present

## 2020-08-25 DIAGNOSIS — I10 Essential (primary) hypertension: Secondary | ICD-10-CM

## 2020-08-25 DIAGNOSIS — E669 Obesity, unspecified: Secondary | ICD-10-CM

## 2020-08-25 DIAGNOSIS — Z6833 Body mass index (BMI) 33.0-33.9, adult: Secondary | ICD-10-CM | POA: Diagnosis not present

## 2020-08-25 MED ORDER — VITAMIN D (ERGOCALCIFEROL) 1.25 MG (50000 UNIT) PO CAPS: 50000.0000 [IU] | ORAL_CAPSULE | ORAL | 0 refills | Status: DC

## 2020-08-25 MED ORDER — SAXENDA 18 MG/3ML ~~LOC~~ SOPN: 3.0000 mg | PEN_INJECTOR | Freq: Every day | SUBCUTANEOUS | 0 refills | Status: DC

## 2020-08-26 NOTE — Progress Notes (Signed)
Chief Complaint:   OBESITY Caitlin Stark is here to discuss her progress with her obesity treatment plan along with follow-up of her obesity related diagnoses. Caitlin Stark is on keeping a food journal and adhering to recommended goals of 1200-1400 calories and 80+ grams of protein daily and states she is following her eating plan approximately 80% of the time. Caitlin Stark states she is doing [employer] for 60 minutes 3 times per week.  Today's visit was #: 7 Starting weight: 215 lbs Starting date: 04/30/2020 Today's weight: 212 lbs Today's date: 08/25/2020 Total lbs lost to date: 3 Total lbs lost since last in-office visit: 2  Interim History: Caitlin Stark continues to do well with weight loss. She is tolerating her medications well, but she still struggles with meeting her protein goals at times.  Subjective:   1. Vitamin D deficiency Caitlin Stark is stable on Vit D, and she denies nausea or vomiting. She requests a refill today.  2. Essential hypertension Caitlin Stark's blood pressure is well controlled on her medications. She denies signs of chest pain, headache, or dizziness.  3. At risk for complication associated with hypotension Caitlin Stark is at a higher than average risk of hypotension due to weight loss.  Assessment/Plan:   1. Vitamin D deficiency Low Vitamin D level contributes to fatigue and are associated with obesity, breast, and colon cancer. We will refill prescription Vitamin D for 1 month, and we will recheck labs in 1 month. Caitlin Stark will follow-up for routine testing of Vitamin D, at least 2-3 times per year to avoid over-replacement.  - Vitamin D, Ergocalciferol, (DRISDOL) 1.25 MG (50000 UNIT) CAPS capsule; Take 1 capsule (50,000 Units total) by mouth every 7 (seven) days.  Dispense: 4 capsule; Refill: 0  2. Essential hypertension Caitlin Stark will continue her medications, healthy weight loss, diet, and exercise to improve blood pressure control. She will watch for signs of hypotension as she  continues her lifestyle modifications.  3. At risk for complication associated with hypotension Caitlin Stark was given approximately 15 minutes of education and counseling today to help avoid hypotension. We discussed risks of hypotension with weight loss and signs of hypotension such as feeling lightheaded or unsteady.  Repetitive spaced learning was employed today to elicit superior memory formation and behavioral change.  4. Class 1 obesity with serious comorbidity and body mass index (BMI) of 33.0 to 33.9 in adult, unspecified obesity type Caitlin Stark is currently in the action stage of change. As such, her goal is to continue with weight loss efforts. She has agreed to keeping a food journal and adhering to recommended goals of 1200-1400 calories and 80+ grams of protein daily.   We discussed various medication options to help Caitlin Stark with her weight loss efforts and we both agreed to continue Saxenda, and we will refill for 1 month.  - Liraglutide -Weight Management (SAXENDA) 18 MG/3ML SOPN; Inject 3 mg into the skin daily. #5 pens  Dispense: 15 mL; Refill: 0  We will recheck fasting labs at her next visit.  Exercise goals: As is.  Behavioral modification strategies: increasing lean protein intake, meal planning and cooking strategies and holiday eating strategies .  Caitlin Stark has agreed to follow-up with our clinic in 3 to 4 weeks. She was informed of the importance of frequent follow-up visits to maximize her success with intensive lifestyle modifications for her multiple health conditions.   Objective:   Blood pressure 112/69, pulse 71, temperature 98 F (36.7 C), height 5\' 7"  (1.702 m), weight 212 lb (96.2 kg),  last menstrual period 09/10/2012, SpO2 97 %. Body mass index is 33.2 kg/m.  General: Cooperative, alert, well developed, in no acute distress. HEENT: Conjunctivae and lids unremarkable. Cardiovascular: Regular rhythm.  Lungs: Normal work of breathing. Neurologic: No focal deficits.    Lab Results  Component Value Date   CREATININE 0.87 04/30/2020   BUN 12 04/30/2020   NA 141 04/30/2020   K 4.5 04/30/2020   CL 103 04/30/2020   CO2 24 04/30/2020   Lab Results  Component Value Date   ALT 19 04/30/2020   AST 19 04/30/2020   ALKPHOS 107 04/30/2020   BILITOT 0.3 04/30/2020   Lab Results  Component Value Date   HGBA1C 5.8 (H) 04/30/2020   HGBA1C 5.5 05/21/2018   HGBA1C 5.7 (H) 01/02/2018   HGBA1C 5.9 (H) 09/06/2017   Lab Results  Component Value Date   INSULIN 12.3 04/30/2020   INSULIN 4.5 05/21/2018   INSULIN 10.1 01/02/2018   INSULIN 7.0 09/06/2017   Lab Results  Component Value Date   TSH 2.030 04/30/2020   Lab Results  Component Value Date   CHOL 181 04/30/2020   HDL 53 04/30/2020   LDLCALC 113 (H) 04/30/2020   TRIG 80 04/30/2020   Lab Results  Component Value Date   WBC 8.0 04/30/2020   HGB 13.3 04/30/2020   HCT 41.2 04/30/2020   MCV 86 04/30/2020   PLT 343 04/30/2020   No results found for: IRON, TIBC, FERRITIN  Attestation Statements:   Reviewed by clinician on day of visit: allergies, medications, problem list, medical history, surgical history, family history, social history, and previous encounter notes.   I, Trixie Dredge, am acting as transcriptionist for Dennard Nip, MD.  I have reviewed the above documentation for accuracy and completeness, and I agree with the above. -  Dennard Nip, MD

## 2020-09-01 ENCOUNTER — Other Ambulatory Visit (INDEPENDENT_AMBULATORY_CARE_PROVIDER_SITE_OTHER): Payer: Self-pay | Admitting: Family Medicine

## 2020-09-02 NOTE — Telephone Encounter (Signed)
Mychart message sent to patient.

## 2020-09-23 ENCOUNTER — Encounter (INDEPENDENT_AMBULATORY_CARE_PROVIDER_SITE_OTHER): Payer: Self-pay | Admitting: Family Medicine

## 2020-09-23 ENCOUNTER — Telehealth (INDEPENDENT_AMBULATORY_CARE_PROVIDER_SITE_OTHER): Payer: BC Managed Care – PPO | Admitting: Family Medicine

## 2020-09-23 ENCOUNTER — Other Ambulatory Visit: Payer: Self-pay

## 2020-09-23 DIAGNOSIS — E669 Obesity, unspecified: Secondary | ICD-10-CM | POA: Diagnosis not present

## 2020-09-23 DIAGNOSIS — K5909 Other constipation: Secondary | ICD-10-CM

## 2020-09-23 DIAGNOSIS — Z9189 Other specified personal risk factors, not elsewhere classified: Secondary | ICD-10-CM | POA: Diagnosis not present

## 2020-09-23 DIAGNOSIS — Z6833 Body mass index (BMI) 33.0-33.9, adult: Secondary | ICD-10-CM | POA: Diagnosis not present

## 2020-09-24 NOTE — Progress Notes (Signed)
TeleHealth Visit:  Due to the COVID-19 pandemic, this visit was completed with telemedicine (audio/video) technology to reduce patient and provider exposure as well as to preserve personal protective equipment.   Caitlin Stark has verbally consented to this TeleHealth visit. The patient is located at home, the provider is located at the [facility] office. The participants in this visit include the listed provider and patient. The visit was conducted today via MyChart video.   Chief Complaint: OBESITY Caitlin Stark is here to discuss her progress with her obesity treatment plan along with follow-up of her obesity related diagnoses. Caitlin Stark is on keeping a food journal and adhering to recommended goals of 1200-1400 calories and 80+ grams of protein daily and states she is following her eating plan approximately 75% of the time. Caitlin Stark states she is doing 0 minutes 0 times per week.  Today's visit was #: 8 Starting weight: 215 lbs Starting date: 04/30/2020  Interim History: Caitlin Stark has done well with maintaining her weight over the holidays. She hasn't been able to exercise as much due to recent injury but she is still trying to be active. Her hunger is well controlled on Saxenda at 1.2 mg.  Subjective:   1. Other constipation Caitlin Stark is on miralax 1/2 capful but her BMs were getting more difficult so to increased to 1 capful daily, but not improvement yet. Her exercise has decreased during this time which may be contributing.  2. At risk for diarrhea Caitlin Stark is at higher risk of diarrhea due to medications and diet.  Assessment/Plan:   1. Other constipation Mariaisabel was informed that a decrease in bowel movement frequency is normal while losing weight, but stools should not be hard or painful. She is ok to start MOM and continue miralax. She is to increase her water intake and will continue to monitor. Orders and follow up as documented in patient record.   2. At risk for diarrhea Caitlin Stark  was given approximately 15 minutes of diarrhea prevention counseling today. She is 50 y.o. female and has risk factors for diarrhea including medications and changes in diet. We discussed intensive lifestyle modifications today with an emphasis on specific weight loss instructions including dietary strategies.   Repetitive spaced learning was employed today to elicit superior memory formation and behavioral change.  3. Class 1 obesity with serious comorbidity and body mass index (BMI) of 33.0 to 33.9 in adult, unspecified obesity type Caitlin Stark is currently in the action stage of change. As such, her goal is to continue with weight loss efforts. She has agreed to keeping a food journal and adhering to recommended goals of 1200-1400 calories and 80+ grams of protein daily.   We discussed various medication options to help Caitlin Stark with her weight loss efforts and we both agreed to continue Saxenda at her current dose and will continue to monitor.  Behavioral modification strategies: increasing lean protein intake and keeping a strict food journal.  Caitlin Stark has agreed to follow-up with our clinic in 3 weeks. She was informed of the importance of frequent follow-up visits to maximize her success with intensive lifestyle modifications for her multiple health conditions.  Objective:   VITALS: Per patient if applicable, see vitals. GENERAL: Alert and in no acute distress. CARDIOPULMONARY: No increased WOB. Speaking in clear sentences.  PSYCH: Pleasant and cooperative. Speech normal rate and rhythm. Affect is appropriate. Insight and judgement are appropriate. Attention is focused, linear, and appropriate.  NEURO: Oriented as arrived to appointment on time with no prompting.  Lab Results  Component Value Date   CREATININE 0.87 04/30/2020   BUN 12 04/30/2020   NA 141 04/30/2020   K 4.5 04/30/2020   CL 103 04/30/2020   CO2 24 04/30/2020   Lab Results  Component Value Date   ALT 19 04/30/2020   AST  19 04/30/2020   ALKPHOS 107 04/30/2020   BILITOT 0.3 04/30/2020   Lab Results  Component Value Date   HGBA1C 5.8 (H) 04/30/2020   HGBA1C 5.5 05/21/2018   HGBA1C 5.7 (H) 01/02/2018   HGBA1C 5.9 (H) 09/06/2017   Lab Results  Component Value Date   INSULIN 12.3 04/30/2020   INSULIN 4.5 05/21/2018   INSULIN 10.1 01/02/2018   INSULIN 7.0 09/06/2017   Lab Results  Component Value Date   TSH 2.030 04/30/2020   Lab Results  Component Value Date   CHOL 181 04/30/2020   HDL 53 04/30/2020   LDLCALC 113 (H) 04/30/2020   TRIG 80 04/30/2020   Lab Results  Component Value Date   WBC 8.0 04/30/2020   HGB 13.3 04/30/2020   HCT 41.2 04/30/2020   MCV 86 04/30/2020   PLT 343 04/30/2020   No results found for: IRON, TIBC, FERRITIN  Attestation Statements:   Reviewed by clinician on day of visit: allergies, medications, problem list, medical history, surgical history, family history, social history, and previous encounter notes.   I, Trixie Dredge, am acting as transcriptionist for Dennard Nip, MD.  I have reviewed the above documentation for accuracy and completeness, and I agree with the above. - Dennard Nip, MD

## 2020-10-05 ENCOUNTER — Encounter (INDEPENDENT_AMBULATORY_CARE_PROVIDER_SITE_OTHER): Payer: Self-pay

## 2020-10-05 ENCOUNTER — Telehealth (INDEPENDENT_AMBULATORY_CARE_PROVIDER_SITE_OTHER): Payer: Self-pay

## 2020-10-05 NOTE — Telephone Encounter (Signed)
PA for [employer] has been denied because pt has not seen therapeutic response since beginning medication as evidenced by weight reduction.

## 2020-10-05 NOTE — Telephone Encounter (Signed)
PA has been initiated via [email or web address] for Saxenda.  Key: [id number] Medication: Saxenda 18MG [employer] Mediate pen-injectors   Form: Charity fundraiser PA Form (2017 [place]CP[employer]) Determination: Wait for Determination Please wait for [employer] NCP[employer] 2017 to return a determination.

## 2020-10-14 ENCOUNTER — Other Ambulatory Visit: Payer: Self-pay

## 2020-10-14 ENCOUNTER — Encounter (INDEPENDENT_AMBULATORY_CARE_PROVIDER_SITE_OTHER): Payer: Self-pay | Admitting: Family Medicine

## 2020-10-14 ENCOUNTER — Ambulatory Visit (INDEPENDENT_AMBULATORY_CARE_PROVIDER_SITE_OTHER): Payer: BC Managed Care – PPO | Admitting: Family Medicine

## 2020-10-14 ENCOUNTER — Telehealth (INDEPENDENT_AMBULATORY_CARE_PROVIDER_SITE_OTHER): Payer: Self-pay

## 2020-10-14 VITALS — BP 120/74 | HR 65 | Temp 98.0°F | Ht 67.0 in | Wt 214.0 lb

## 2020-10-14 DIAGNOSIS — E559 Vitamin D deficiency, unspecified: Secondary | ICD-10-CM

## 2020-10-14 DIAGNOSIS — Z9189 Other specified personal risk factors, not elsewhere classified: Secondary | ICD-10-CM | POA: Diagnosis not present

## 2020-10-14 DIAGNOSIS — E669 Obesity, unspecified: Secondary | ICD-10-CM

## 2020-10-14 DIAGNOSIS — E782 Mixed hyperlipidemia: Secondary | ICD-10-CM

## 2020-10-14 DIAGNOSIS — Z6833 Body mass index (BMI) 33.0-33.9, adult: Secondary | ICD-10-CM

## 2020-10-14 DIAGNOSIS — R7303 Prediabetes: Secondary | ICD-10-CM | POA: Diagnosis not present

## 2020-10-14 MED ORDER — WEGOVY 0.5 MG/0.5ML ~~LOC~~ SOAJ: 0.5000 mg | SUBCUTANEOUS | 0 refills | Status: DC

## 2020-10-14 NOTE — Progress Notes (Signed)
Chief Complaint:   OBESITY Caitlin Stark is here to discuss her progress with her obesity treatment plan along with follow-up of her obesity related diagnoses. Caitlin Stark is on keeping a food journal and adhering to recommended goals of 1200-1400 calories and 80+ grams of protein daily and states she is following her eating plan approximately 70% of the time. Caitlin Stark states she is walking (minimal actual activity due to hip pain).   Today's visit was #: 9 Starting weight: 215 lbs Starting date: 04/30/2020 Today's weight: 214 lbs Today's date: 10/14/2020 Total lbs lost to date: 1 Total lbs lost since last in-office visit: 0  Interim History: Caitlin Stark's Caitlin Stark was denied by insurance due to lack of response to [place]. She still has some Saxenda but decreased dose to 0.6 mg to make it last longer and also due to worsening constipation.  Subjective:   1. Vitamin D deficiency Caitlin Stark is on Vit D prescription, and she is due to have labs checked. Her last level was almost at goal.  2. Mixed hyperlipidemia Caitlin Stark's last LDL was slightly elevated. She is not on statin, and she is working on diet and exercising.  3. Pre-diabetes Caitlin Stark is due to have labs checked. She is working on diet, exercise, and weight loss.  4. At risk for diabetes mellitus Caitlin Stark is at higher than average risk for developing diabetes due to obesity.   Assessment/Plan:   1. Vitamin D deficiency Low Vitamin D level contributes to fatigue and are associated with obesity, breast, and colon cancer. We will check labs today. Caitlin Stark will follow-up for routine testing of Vitamin D, at least 2-3 times per year to avoid over-replacement.  - VITAMIN D 25 Hydroxy (Vit-D Deficiency, Fractures)  2. Mixed hyperlipidemia Cardiovascular risk and specific lipid/LDL goals reviewed. We discussed several lifestyle modifications today. We will check labs today. Caitlin Stark will continue to work on diet, exercise and weight loss efforts. Orders and  follow up as documented in patient record.   - Lipid Panel With LDL/HDL Ratio  3. Pre-diabetes Caitlin Stark will continue to work on weight loss, exercise, and decreasing simple carbohydrates to help decrease the risk of diabetes. We will check labs today.  - Comprehensive metabolic panel - Insulin, random - Hemoglobin A1c  4. At risk for diabetes mellitus Caitlin Stark was given approximately 15 minutes of diabetes education and counseling today. We discussed intensive lifestyle modifications today with an emphasis on weight loss as well as increasing exercise and decreasing simple carbohydrates in her diet. We also reviewed medication options with an emphasis on risk versus benefit of those discussed.   Repetitive spaced learning was employed today to elicit superior memory formation and behavioral change.  5. Class 1 obesity with serious comorbidity and body mass index (BMI) of 33.0 to 33.9 in adult, unspecified obesity type Caitlin Stark is currently in the action stage of change. As such, her goal is to continue with weight loss efforts. She has agreed to keeping a food journal and adhering to recommended goals of 1200-1400 calories and 80 grams of protein daily.   We discussed various medication options to help Caitlin Stark with her weight loss efforts and we both agreed to change to [facility] 0.5 mg and she is ok to continue miralax daily.  - Semaglutide-Weight Management (WEGOVY) 0.5 MG/0.5ML SOAJ; Inject 0.5 mg into the skin every 7 (seven) days.  Dispense: 2 mL; Refill: 0  Exercise goals: As is.  Behavioral modification strategies: increasing water intake and keeping a strict food journal.  Caitlin Stark has agreed to follow-up with our clinic in 4 to 6 weeks. She was informed of the importance of frequent follow-up visits to maximize her success with intensive lifestyle modifications for her multiple health conditions.   Caitlin Stark was informed we would discuss her lab results at her next visit unless there is a  critical issue that needs to be addressed sooner. Caitlin Stark agreed to keep her next visit at the agreed upon time to discuss these results.  Objective:   Blood pressure 120/74, pulse 65, temperature 98 F (36.7 C), height 5\' 7"  (1.702 m), weight 214 lb (97.1 kg), last menstrual period 09/10/2012, SpO2 99 %. Body mass index is 33.52 kg/m.  General: Cooperative, alert, well developed, in no acute distress. HEENT: Conjunctivae and lids unremarkable. Cardiovascular: Regular rhythm.  Lungs: Normal work of breathing. Neurologic: No focal deficits.   Lab Results  Component Value Date   CREATININE 0.87 04/30/2020   BUN 12 04/30/2020   NA 141 04/30/2020   K 4.5 04/30/2020   CL 103 04/30/2020   CO2 24 04/30/2020   Lab Results  Component Value Date   ALT 19 04/30/2020   AST 19 04/30/2020   ALKPHOS 107 04/30/2020   BILITOT 0.3 04/30/2020   Lab Results  Component Value Date   HGBA1C 5.8 (H) 04/30/2020   HGBA1C 5.5 05/21/2018   HGBA1C 5.7 (H) 01/02/2018   HGBA1C 5.9 (H) 09/06/2017   Lab Results  Component Value Date   INSULIN 12.3 04/30/2020   INSULIN 4.5 05/21/2018   INSULIN 10.1 01/02/2018   INSULIN 7.0 09/06/2017   Lab Results  Component Value Date   TSH 2.030 04/30/2020   Lab Results  Component Value Date   CHOL 181 04/30/2020   HDL 53 04/30/2020   LDLCALC 113 (H) 04/30/2020   TRIG 80 04/30/2020   Lab Results  Component Value Date   WBC 8.0 04/30/2020   HGB 13.3 04/30/2020   HCT 41.2 04/30/2020   MCV 86 04/30/2020   PLT 343 04/30/2020   No results found for: IRON, TIBC, FERRITIN  Attestation Statements:   Reviewed by clinician on day of visit: allergies, medications, problem list, medical history, surgical history, family history, social history, and previous encounter notes.   I, Trixie Dredge, am acting as transcriptionist for Dennard Nip, MD.  I have reviewed the above documentation for accuracy and completeness, and I agree with the above. -  Dennard Nip, MD

## 2020-10-14 NOTE — Telephone Encounter (Signed)
PA for [facility] has been initiated via [email or web address].  Key: [id number] Rx #: [id number] Medication: Wegovy 0.5MG /0.5ML auto-injectors   Form: [employer] Electronic PA Form (2017 NCPDP) Determination: Wait for Determination Please wait for [employer] NCPDP 2017 to return a determination.

## 2020-10-15 LAB — INSULIN, RANDOM: INSULIN: 14.8 u[IU]/mL (ref 2.6–24.9)

## 2020-10-15 LAB — COMPREHENSIVE METABOLIC PANEL
ALT: 15 IU/L (ref 0–32)
AST: 20 IU/L (ref 0–40)
Albumin/Globulin Ratio: 1.4 (ref 1.2–2.2)
Albumin: 4.5 g/dL (ref 3.8–4.8)
Alkaline Phosphatase: 133 IU/L — ABNORMAL HIGH (ref 44–121)
BUN/Creatinine Ratio: 11 (ref 9–23)
BUN: 11 mg/dL (ref 6–24)
Bilirubin Total: 0.3 mg/dL (ref 0.0–1.2)
CO2: 22 mmol/L (ref 20–29)
Calcium: 9.7 mg/dL (ref 8.7–10.2)
Chloride: 101 mmol/L (ref 96–106)
Creatinine, Ser: 1.03 mg/dL — ABNORMAL HIGH (ref 0.57–1.00)
GFR calc Af Amer: 73 mL/min/{1.73_m2} (ref >59)
GFR calc non Af Amer: 64 mL/min/{1.73_m2} (ref >59)
Globulin, Total: 3.2 g/dL (ref 1.5–4.5)
Glucose: 90 mg/dL (ref 65–99)
Potassium: 4.3 mmol/L (ref 3.5–5.2)
Sodium: 141 mmol/L (ref 134–144)
Total Protein: 7.7 g/dL (ref 6.0–8.5)

## 2020-10-15 LAB — HEMOGLOBIN A1C
Est. average glucose Bld gHb Est-mCnc: 114 mg/dL
Hgb A1c MFr Bld: 5.6 % (ref 4.8–5.6)

## 2020-10-15 LAB — LIPID PANEL WITH LDL/HDL RATIO
Cholesterol, Total: 152 mg/dL (ref 100–199)
HDL: 45 mg/dL (ref >39)
LDL Chol Calc (NIH): 93 mg/dL (ref 0–99)
LDL/HDL Ratio: 2.1 ratio (ref 0.0–3.2)
Triglycerides: 71 mg/dL (ref 0–149)
VLDL Cholesterol Cal: 14 mg/dL (ref 5–40)

## 2020-10-15 LAB — VITAMIN D 25 HYDROXY (VIT D DEFICIENCY, FRACTURES): Vit D, 25-Hydroxy: 48.7 ng/mL (ref 30.0–100.0)

## 2020-10-21 NOTE — Telephone Encounter (Signed)
Yes, please switch

## 2020-11-16 ENCOUNTER — Encounter (INDEPENDENT_AMBULATORY_CARE_PROVIDER_SITE_OTHER): Payer: Self-pay | Admitting: Family Medicine

## 2020-11-16 ENCOUNTER — Ambulatory Visit (INDEPENDENT_AMBULATORY_CARE_PROVIDER_SITE_OTHER): Payer: BC Managed Care – PPO | Admitting: Family Medicine

## 2020-11-16 ENCOUNTER — Other Ambulatory Visit: Payer: Self-pay

## 2020-11-16 VITALS — BP 123/68 | HR 71 | Temp 98.2°F | Ht 67.0 in | Wt 214.0 lb

## 2020-11-16 DIAGNOSIS — Z6833 Body mass index (BMI) 33.0-33.9, adult: Secondary | ICD-10-CM

## 2020-11-16 DIAGNOSIS — E669 Obesity, unspecified: Secondary | ICD-10-CM | POA: Diagnosis not present

## 2020-11-16 DIAGNOSIS — Z9189 Other specified personal risk factors, not elsewhere classified: Secondary | ICD-10-CM

## 2020-11-16 DIAGNOSIS — I10 Essential (primary) hypertension: Secondary | ICD-10-CM | POA: Diagnosis not present

## 2020-11-16 DIAGNOSIS — E559 Vitamin D deficiency, unspecified: Secondary | ICD-10-CM

## 2020-11-16 MED ORDER — VITAMIN D (ERGOCALCIFEROL) 1.25 MG (50000 UNIT) PO CAPS: 50000.0000 [IU] | ORAL_CAPSULE | ORAL | 0 refills | Status: DC

## 2020-11-16 MED ORDER — WEGOVY 0.5 MG/0.5ML ~~LOC~~ SOAJ: 0.5000 mg | SUBCUTANEOUS | 0 refills | Status: DC

## 2020-11-19 NOTE — Progress Notes (Signed)
Chief Complaint:   OBESITY Caitlin Stark is here to discuss her progress with her obesity treatment plan along with follow-up of her obesity related diagnoses. Caitlin Stark is on keeping a food journal and adhering to recommended goals of 1200-1400 calories and 80+ grams of protein daily and states she is following her eating plan approximately 85% of the time. Caitlin Stark states she is doing 0 minutes 0 times per week.  Today's visit was #: 10 Starting weight: 215 lbs Starting date: 04/30/2020 Today's weight: 214 lbs Today's date: 11/16/2020 Total lbs lost to date: 1 Total lbs lost since last in-office visit: 0  Interim History: Caitlin Stark is doing well on her Wegovy. She notes her hunger was better controlled overall. She sometimes struggles to eat all of her protein.  Subjective:   1. Vitamin D deficiency Caitlin Stark is stable on Vit D. Her level is slowly improving, but is not yet at goal.  2. Essential hypertension Caitlin Stark's blood pressure is well controlled. She continues to do well with eating healthy and shows no signs of hypotension.  3. At risk for osteoporosis Caitlin Stark is at higher risk of osteopenia and osteoporosis due to Vitamin D deficiency.   Assessment/Plan:   1. Vitamin D deficiency Low Vitamin D level contributes to fatigue and are associated with obesity, breast, and colon cancer. We will refill prescription Vitamin D for 1 month. Caitlin Stark will follow-up for routine testing of Vitamin D, at least 2-3 times per year to avoid over-replacement.  - Vitamin D, Ergocalciferol, (DRISDOL) 1.25 MG (50000 UNIT) CAPS capsule; Take 1 capsule (50,000 Units total) by mouth every 7 (seven) days.  Dispense: 4 capsule; Refill: 0  2. Essential hypertension Caitlin Stark will continue her medications, diet, and exercise to improve blood pressure control. We will watch for signs of hypotension as she continues her lifestyle modifications.  3. At risk for osteoporosis Caitlin Stark was given approximately 15 minutes of  osteoporosis prevention counseling today. Caitlin Stark is at risk for osteopenia and osteoporosis due to her Vitamin D deficiency. She was encouraged to take her Vitamin D and follow her higher calcium diet and increase strengthening exercise to help strengthen her bones and decrease her risk of osteopenia and osteoporosis.  Repetitive spaced learning was employed today to elicit superior memory formation and behavioral change.  4. Class 1 obesity with serious comorbidity and body mass index (BMI) of 33.0 to 33.9 in adult, unspecified obesity type Caitlin Stark is currently in the action stage of change. As such, her goal is to continue with weight loss efforts. She has agreed to keeping a food journal and adhering to recommended goals of 1200-1400 calories and 80+ grams of protein daily.   We discussed various medication options to help Caitlin Stark with her weight loss efforts and we both agreed to continue Wegovy, and we will refill for 1 month.  - Semaglutide-Weight Management (WEGOVY) 0.5 MG/0.5ML SOAJ; Inject 0.5 mg into the skin every 7 (seven) days.  Dispense: 2 mL; Refill: 0  Behavioral modification strategies: increasing lean protein intake and better snacking choices.  Caitlin Stark has agreed to follow-up with our clinic in 4 weeks. She was informed of the importance of frequent follow-up visits to maximize her success with intensive lifestyle modifications for her multiple health conditions.   Objective:   Blood pressure 123/68, pulse 71, temperature 98.2 F (36.8 C), height 5\' 7"  (1.702 m), weight 214 lb (97.1 kg), last menstrual period 09/10/2012, SpO2 99 %. Body mass index is 33.52 kg/m.  General: Cooperative, alert, well  developed, in no acute distress. HEENT: Conjunctivae and lids unremarkable. Cardiovascular: Regular rhythm.  Lungs: Normal work of breathing. Neurologic: No focal deficits.   Lab Results  Component Value Date   CREATININE 1.03 (H) 10/14/2020   BUN 11 10/14/2020   NA 141  10/14/2020   K 4.3 10/14/2020   CL 101 10/14/2020   CO2 22 10/14/2020   Lab Results  Component Value Date   ALT 15 10/14/2020   AST 20 10/14/2020   ALKPHOS 133 (H) 10/14/2020   BILITOT 0.3 10/14/2020   Lab Results  Component Value Date   HGBA1C 5.6 10/14/2020   HGBA1C 5.8 (H) 04/30/2020   HGBA1C 5.5 05/21/2018   HGBA1C 5.7 (H) 01/02/2018   HGBA1C 5.9 (H) 09/06/2017   Lab Results  Component Value Date   INSULIN 14.8 10/14/2020   INSULIN 12.3 04/30/2020   INSULIN 4.5 05/21/2018   INSULIN 10.1 01/02/2018   INSULIN 7.0 09/06/2017   Lab Results  Component Value Date   TSH 2.030 04/30/2020   Lab Results  Component Value Date   CHOL 152 10/14/2020   HDL 45 10/14/2020   LDLCALC 93 10/14/2020   TRIG 71 10/14/2020   Lab Results  Component Value Date   WBC 8.0 04/30/2020   HGB 13.3 04/30/2020   HCT 41.2 04/30/2020   MCV 86 04/30/2020   PLT 343 04/30/2020   No results found for: IRON, TIBC, FERRITIN  Attestation Statements:   Reviewed by clinician on day of visit: allergies, medications, problem list, medical history, surgical history, family history, social history, and previous encounter notes.    I, Trixie Dredge, am acting as transcriptionist for Dennard Nip, MD.  I have reviewed the above documentation for accuracy and completeness, and I agree with the above. -  Dennard Nip, MD

## 2020-12-16 ENCOUNTER — Encounter (INDEPENDENT_AMBULATORY_CARE_PROVIDER_SITE_OTHER): Payer: Self-pay | Admitting: Family Medicine

## 2020-12-16 NOTE — Telephone Encounter (Signed)
Dr.Beasley 

## 2020-12-17 ENCOUNTER — Other Ambulatory Visit (INDEPENDENT_AMBULATORY_CARE_PROVIDER_SITE_OTHER): Payer: Self-pay

## 2020-12-17 DIAGNOSIS — E669 Obesity, unspecified: Secondary | ICD-10-CM

## 2020-12-17 DIAGNOSIS — Z6833 Body mass index (BMI) 33.0-33.9, adult: Secondary | ICD-10-CM

## 2020-12-17 MED ORDER — WEGOVY 0.5 MG/0.5ML ~~LOC~~ SOAJ: 0.5000 mg | SUBCUTANEOUS | 0 refills | Status: DC

## 2020-12-17 NOTE — Telephone Encounter (Signed)
Ok x 1

## 2020-12-21 ENCOUNTER — Ambulatory Visit (INDEPENDENT_AMBULATORY_CARE_PROVIDER_SITE_OTHER): Payer: BC Managed Care – PPO | Admitting: Family Medicine

## 2020-12-28 ENCOUNTER — Encounter (INDEPENDENT_AMBULATORY_CARE_PROVIDER_SITE_OTHER): Payer: Self-pay | Admitting: Family Medicine

## 2020-12-28 ENCOUNTER — Other Ambulatory Visit: Payer: Self-pay

## 2020-12-28 ENCOUNTER — Ambulatory Visit (INDEPENDENT_AMBULATORY_CARE_PROVIDER_SITE_OTHER): Payer: BC Managed Care – PPO | Admitting: Family Medicine

## 2020-12-28 VITALS — BP 118/71 | HR 62 | Temp 98.7°F | Ht 67.0 in | Wt 216.0 lb

## 2020-12-28 DIAGNOSIS — Z9189 Other specified personal risk factors, not elsewhere classified: Secondary | ICD-10-CM | POA: Diagnosis not present

## 2020-12-28 DIAGNOSIS — E669 Obesity, unspecified: Secondary | ICD-10-CM

## 2020-12-28 DIAGNOSIS — E559 Vitamin D deficiency, unspecified: Secondary | ICD-10-CM | POA: Diagnosis not present

## 2020-12-28 DIAGNOSIS — Z6833 Body mass index (BMI) 33.0-33.9, adult: Secondary | ICD-10-CM | POA: Diagnosis not present

## 2020-12-29 MED ORDER — VITAMIN D (ERGOCALCIFEROL) 1.25 MG (50000 UNIT) PO CAPS
50000.0000 [IU] | ORAL_CAPSULE | ORAL | 0 refills | Status: DC
Start: 2020-12-29 — End: 2021-02-08

## 2020-12-29 MED ORDER — WEGOVY 0.5 MG/0.5ML ~~LOC~~ SOAJ
0.5000 mg | SUBCUTANEOUS | 0 refills | Status: DC
Start: 2020-12-29 — End: 2021-01-19

## 2020-12-30 NOTE — Progress Notes (Signed)
Chief Complaint:   OBESITY Caitlin Stark is here to discuss her progress with her obesity treatment plan along with follow-up of her obesity related diagnoses. Caitlin Stark is on keeping a food journal and adhering to recommended goals of 1200-1400 calories and 80+ grams of protein daily and states she is following her eating plan approximately 80% of the time. Caitlin Stark states she is walking for 40 minutes 3 times per week.  Today's visit was #: 11 Starting weight: 215 lbs Starting date: 04/30/2020 Today's weight: 216 lbs Today's date: 12/28/2020 Total lbs lost to date: 0 Total lbs lost since last in-office visit: 0  Interim History: Caitlin Stark is struggling with weight loss despite being mindful and making smarter choices. Her hunger is controlled on Wegovy with no side effects. She is walking regularly and she plans to restart [employer].  Subjective:   1. Vitamin D deficiency Caitlin Stark is stable on Vit D, but her level is not yet at goal.  2. At risk for impaired metabolic function Caitlin Stark is at increased risk for impaired metabolic function if protein decreases.  Assessment/Plan:   1. Vitamin D deficiency Low Vitamin D level contributes to fatigue and are associated with obesity, breast, and colon cancer. We will refill prescription Vitamin D for 1 month. Caitlin Stark will follow-up for routine testing of Vitamin D, at least 2-3 times per year to avoid over-replacement.  - Vitamin D, Ergocalciferol, (DRISDOL) 1.25 MG (50000 UNIT) CAPS capsule; Take 1 capsule (50,000 Units total) by mouth every 7 (seven) days.  Dispense: 4 capsule; Refill: 0  2. At risk for impaired metabolic function Caitlin Stark was given approximately 15 minutes of impaired  metabolic function prevention counseling today. We discussed intensive lifestyle modifications today with an emphasis on specific nutrition and exercise instructions and strategies.   Repetitive spaced learning was employed today to elicit superior memory formation  and behavioral change.  3. Class 1 obesity with serious comorbidity and body mass index (BMI) of 33.0 to 33.9 in adult, unspecified obesity type Caitlin Stark is currently in the action stage of change. As such, her goal is to continue with weight loss efforts. She has agreed to keeping a food journal and adhering to recommended goals of 1200-1400 calories and 80+ grams of protein daily.   We discussed various medication options to help Caitlin Stark with her weight loss efforts and we both agreed to continue Wegovy, and we will refill for 1 month. We will recheck her IC at her next vsisit to see if her RMR has decreased.  - Semaglutide-Weight Management (WEGOVY) 0.5 MG/0.5ML SOAJ; Inject 0.5 mg into the skin every 7 (seven) days.  Dispense: 2 mL; Refill: 0  Exercise goals: As is. Back to [facility] 2-3 times per week.  Behavioral modification strategies: increasing lean protein intake and planning for success.  Jin has agreed to follow-up with our clinic in 4 weeks. She was informed of the importance of frequent follow-up visits to maximize her success with intensive lifestyle modifications for her multiple health conditions.   Objective:   Blood pressure 118/71, pulse 62, temperature 98.7 F (37.1 C), height 5\' 7"  (1.702 m), weight 216 lb (98 kg), last menstrual period 09/10/2012, SpO2 99 %. Body mass index is 33.83 kg/m.  General: Cooperative, alert, well developed, in no acute distress. HEENT: Conjunctivae and lids unremarkable. Cardiovascular: Regular rhythm.  Lungs: Normal work of breathing. Neurologic: No focal deficits.   Lab Results  Component Value Date   CREATININE 1.03 (H) 10/14/2020   BUN  11 10/14/2020   NA 141 10/14/2020   K 4.3 10/14/2020   CL 101 10/14/2020   CO2 22 10/14/2020   Lab Results  Component Value Date   ALT 15 10/14/2020   AST 20 10/14/2020   ALKPHOS 133 (H) 10/14/2020   BILITOT 0.3 10/14/2020   Lab Results  Component Value Date   HGBA1C 5.6 10/14/2020    HGBA1C 5.8 (H) 04/30/2020   HGBA1C 5.5 05/21/2018   HGBA1C 5.7 (H) 01/02/2018   HGBA1C 5.9 (H) 09/06/2017   Lab Results  Component Value Date   INSULIN 14.8 10/14/2020   INSULIN 12.3 04/30/2020   INSULIN 4.5 05/21/2018   INSULIN 10.1 01/02/2018   INSULIN 7.0 09/06/2017   Lab Results  Component Value Date   TSH 2.030 04/30/2020   Lab Results  Component Value Date   CHOL 152 10/14/2020   HDL 45 10/14/2020   LDLCALC 93 10/14/2020   TRIG 71 10/14/2020   Lab Results  Component Value Date   WBC 8.0 04/30/2020   HGB 13.3 04/30/2020   HCT 41.2 04/30/2020   MCV 86 04/30/2020   PLT 343 04/30/2020   No results found for: IRON, TIBC, FERRITIN  Attestation Statements:   Reviewed by clinician on day of visit: allergies, medications, problem list, medical history, surgical history, family history, social history, and previous encounter notes.   I, Trixie Dredge, am acting as transcriptionist for Dennard Nip, MD.  I have reviewed the above documentation for accuracy and completeness, and I agree with the above. -  Dennard Nip, MD

## 2021-01-19 ENCOUNTER — Other Ambulatory Visit (INDEPENDENT_AMBULATORY_CARE_PROVIDER_SITE_OTHER): Payer: Self-pay | Admitting: Emergency Medicine

## 2021-01-19 DIAGNOSIS — E669 Obesity, unspecified: Secondary | ICD-10-CM

## 2021-01-19 MED ORDER — WEGOVY 0.5 MG/0.5ML ~~LOC~~ SOAJ: 0.5000 mg | SUBCUTANEOUS | 0 refills | Status: DC

## 2021-01-19 NOTE — Telephone Encounter (Signed)
Ok x 1

## 2021-01-25 NOTE — Telephone Encounter (Signed)
Pt last seen by Dr. Beasley.  

## 2021-01-27 ENCOUNTER — Encounter (INDEPENDENT_AMBULATORY_CARE_PROVIDER_SITE_OTHER): Payer: Self-pay | Admitting: Emergency Medicine

## 2021-02-08 ENCOUNTER — Other Ambulatory Visit: Payer: Self-pay

## 2021-02-08 ENCOUNTER — Ambulatory Visit (INDEPENDENT_AMBULATORY_CARE_PROVIDER_SITE_OTHER): Payer: BC Managed Care – PPO | Admitting: Family Medicine

## 2021-02-08 ENCOUNTER — Encounter (INDEPENDENT_AMBULATORY_CARE_PROVIDER_SITE_OTHER): Payer: Self-pay | Admitting: Family Medicine

## 2021-02-08 VITALS — BP 116/67 | HR 68 | Temp 98.4°F | Ht 67.0 in | Wt 219.0 lb

## 2021-02-08 DIAGNOSIS — E669 Obesity, unspecified: Secondary | ICD-10-CM | POA: Diagnosis not present

## 2021-02-08 DIAGNOSIS — E559 Vitamin D deficiency, unspecified: Secondary | ICD-10-CM

## 2021-02-08 DIAGNOSIS — Z9189 Other specified personal risk factors, not elsewhere classified: Secondary | ICD-10-CM

## 2021-02-08 DIAGNOSIS — E66811 Obesity, class 1: Secondary | ICD-10-CM

## 2021-02-08 DIAGNOSIS — R7301 Impaired fasting glucose: Secondary | ICD-10-CM | POA: Diagnosis not present

## 2021-02-08 DIAGNOSIS — Z6830 Body mass index (BMI) 30.0-30.9, adult: Secondary | ICD-10-CM

## 2021-02-08 MED ORDER — OZEMPIC (0.25 OR 0.5 MG/DOSE) 2 MG/1.5ML ~~LOC~~ SOPN: 0.5000 mg | PEN_INJECTOR | SUBCUTANEOUS | 0 refills | Status: DC

## 2021-02-08 MED ORDER — VITAMIN D (ERGOCALCIFEROL) 1.25 MG (50000 UNIT) PO CAPS: 50000.0000 [IU] | ORAL_CAPSULE | ORAL | 0 refills | Status: DC

## 2021-02-16 NOTE — Progress Notes (Signed)
Chief Complaint:   OBESITY Caitlin Stark is here to discuss her progress with her obesity treatment plan along with follow-up of her obesity related diagnoses. Caitlin Stark is on keeping a food journal and adhering to recommended goals of 1200-1400 calories and 80+ grams of protein daily and states she is following her eating plan approximately 85% of the time. Caitlin Stark states she is doing orange therapy for 60 minutes 2-3 times per week.  Today's visit was #: 12 Starting weight: 215 lbs Starting date: 04/30/2020 Today's weight: 219 lbs Today's date: 02/08/2021 Total lbs lost to date: 0 Total lbs lost since last in-office visit: 0  Interim History: Caitlin Stark started journaling but she fell off track when work became more stressful. She is still mindful of her food choices. She has started orange theory, and the bioimpedance scale suggests that weigh gain is due to muscle increase.  Subjective:   1. Vitamin D deficiency Caitlin Stark is on Vit D, and she denies nausea or vomiting. She requests a refill.  2. Impaired fasting glucose Caitlin Stark is working on diet and exercise, and she had been on Wegovy but there is a drug shortage.   3. At risk for heart disease Caitlin Stark is at a higher than average risk for cardiovascular disease due to obesity.   Assessment/Plan:   1. Vitamin D deficiency Low Vitamin D level contributes to fatigue and are associated with obesity, breast, and colon cancer. We will refill prescription Vitamin D for 1 month. Caitlin Stark will follow-up for routine testing of Vitamin D, at least 2-3 times per year to avoid over-replacement.  - Vitamin D, Ergocalciferol, (DRISDOL) 1.25 MG (50000 UNIT) CAPS capsule; Take 1 capsule (50,000 Units total) by mouth every 7 (seven) days.  Dispense: 4 capsule; Refill: 0  2. Impaired fasting glucose Caitlin Stark agreed to start Ozempic 0.5 g q weekly with no refills. She will continue to follow up as directed.  - Semaglutide,0.25 or 0.5MG /DOS, (OZEMPIC, 0.25 OR 0.5  MG/DOSE,) 2 MG/1.5ML SOPN; Inject 0.5 mg into the skin once a week.  Dispense: 1.5 mL; Refill: 0  3. At risk for heart disease Caitlin Stark was given approximately 15 minutes of coronary artery disease prevention counseling today. She is 50 y.o. female and has risk factors for heart disease including obesity. We discussed intensive lifestyle modifications today with an emphasis on specific weight loss instructions and strategies.   Repetitive spaced learning was employed today to elicit superior memory formation and behavioral change.  4. Obesity with current BMI 34.3 Caitlin Stark is currently in the action stage of change. As such, her goal is to continue with weight loss efforts. She has agreed to keeping a food journal and adhering to recommended goals of 1200-1400 calories and 80+ grams of protein daily.   Exercise goals: As is.  Behavioral modification strategies: increasing lean protein intake and decreasing simple carbohydrates.  Caitlin Stark has agreed to follow-up with our clinic in 4 weeks. She was informed of the importance of frequent follow-up visits to maximize her success with intensive lifestyle modifications for her multiple health conditions.   Objective:   Blood pressure 116/67, pulse 68, temperature 98.4 F (36.9 C), height 5\' 7"  (1.702 m), weight 219 lb (99.3 kg), last menstrual period 09/10/2012, SpO2 98 %. Body mass index is 34.3 kg/m.  General: Cooperative, alert, well developed, in no acute distress. HEENT: Conjunctivae and lids unremarkable. Cardiovascular: Regular rhythm.  Lungs: Normal work of breathing. Neurologic: No focal deficits.   Lab Results  Component Value Date  CREATININE 1.03 (H) 10/14/2020   BUN 11 10/14/2020   NA 141 10/14/2020   K 4.3 10/14/2020   CL 101 10/14/2020   CO2 22 10/14/2020   Lab Results  Component Value Date   ALT 15 10/14/2020   AST 20 10/14/2020   ALKPHOS 133 (H) 10/14/2020   BILITOT 0.3 10/14/2020   Lab Results  Component Value  Date   HGBA1C 5.6 10/14/2020   HGBA1C 5.8 (H) 04/30/2020   HGBA1C 5.5 05/21/2018   HGBA1C 5.7 (H) 01/02/2018   HGBA1C 5.9 (H) 09/06/2017   Lab Results  Component Value Date   INSULIN 14.8 10/14/2020   INSULIN 12.3 04/30/2020   INSULIN 4.5 05/21/2018   INSULIN 10.1 01/02/2018   INSULIN 7.0 09/06/2017   Lab Results  Component Value Date   TSH 2.030 04/30/2020   Lab Results  Component Value Date   CHOL 152 10/14/2020   HDL 45 10/14/2020   LDLCALC 93 10/14/2020   TRIG 71 10/14/2020   Lab Results  Component Value Date   WBC 8.0 04/30/2020   HGB 13.3 04/30/2020   HCT 41.2 04/30/2020   MCV 86 04/30/2020   PLT 343 04/30/2020   No results found for: IRON, TIBC, FERRITIN  Attestation Statements:   Reviewed by clinician on day of visit: allergies, medications, problem list, medical history, surgical history, family history, social history, and previous encounter notes.   I, Trixie Dredge, am acting as transcriptionist for Dennard Nip, MD.  I have reviewed the above documentation for accuracy and completeness, and I agree with the above. -  Dennard Nip, MD

## 2021-03-11 ENCOUNTER — Ambulatory Visit (INDEPENDENT_AMBULATORY_CARE_PROVIDER_SITE_OTHER): Payer: BC Managed Care – PPO | Admitting: Family Medicine

## 2021-03-11 ENCOUNTER — Encounter (INDEPENDENT_AMBULATORY_CARE_PROVIDER_SITE_OTHER): Payer: Self-pay

## 2021-03-16 ENCOUNTER — Ambulatory Visit (INDEPENDENT_AMBULATORY_CARE_PROVIDER_SITE_OTHER): Payer: BC Managed Care – PPO | Admitting: Family Medicine

## 2021-03-16 ENCOUNTER — Other Ambulatory Visit: Payer: Self-pay

## 2021-03-16 ENCOUNTER — Encounter (INDEPENDENT_AMBULATORY_CARE_PROVIDER_SITE_OTHER): Payer: Self-pay | Admitting: Family Medicine

## 2021-03-16 VITALS — BP 113/68 | HR 61 | Temp 97.8°F | Ht 67.0 in | Wt 216.0 lb

## 2021-03-16 DIAGNOSIS — R7303 Prediabetes: Secondary | ICD-10-CM | POA: Diagnosis not present

## 2021-03-16 DIAGNOSIS — Z9189 Other specified personal risk factors, not elsewhere classified: Secondary | ICD-10-CM

## 2021-03-16 DIAGNOSIS — E559 Vitamin D deficiency, unspecified: Secondary | ICD-10-CM | POA: Diagnosis not present

## 2021-03-16 DIAGNOSIS — Z6830 Body mass index (BMI) 30.0-30.9, adult: Secondary | ICD-10-CM | POA: Diagnosis not present

## 2021-03-16 DIAGNOSIS — E669 Obesity, unspecified: Secondary | ICD-10-CM | POA: Diagnosis not present

## 2021-03-16 MED ORDER — OZEMPIC (0.25 OR 0.5 MG/DOSE) 2 MG/1.5ML ~~LOC~~ SOPN
0.5000 mg | PEN_INJECTOR | SUBCUTANEOUS | 0 refills | Status: DC
Start: 2021-03-16 — End: 2021-04-29

## 2021-03-16 MED ORDER — VITAMIN D (ERGOCALCIFEROL) 1.25 MG (50000 UNIT) PO CAPS: 50000.0000 [IU] | ORAL_CAPSULE | ORAL | 0 refills | Status: DC

## 2021-03-23 NOTE — Progress Notes (Signed)
Chief Complaint:   OBESITY Caitlin Stark is here to discuss her progress with her obesity treatment plan along with follow-up of her obesity related diagnoses. Caitlin Stark is on keeping a food journal and adhering to recommended goals of 1200-1400 calories and 80+ grams of protein daily and states she is following her eating plan approximately 85% of the time. Caitlin Stark states she is doing orange theory for 60 minutes 3 times per week.  Today's visit was #: 73 Starting weight: 215 lbs Starting date: 04/30/2020 Today's weight: 216 lbs Today's date: 03/16/2021 Total lbs lost to date: 0 Total lbs lost since last in-office visit: 3  Interim History: Caitlin Stark continues to do well with weight loss. She is mindful of her food and she is trying to decrease simple carbohydrates. She is exercising regularly with a combination of cardio and strengthening.  Subjective:   1. Pre-diabetes Caitlin Stark is stable on Ozempic. She notes decreased polyphagia and she is making good food choices.  2. Vitamin D deficiency Caitlin Stark is stable on Vit D, and she denies signs of over-replacement.  3. At risk for diabetes mellitus Caitlin Stark is at higher than average risk for developing diabetes due to obesity.   Assessment/Plan:   1. Pre-diabetes [name] will continue to work on weight loss, exercise, and decreasing simple carbohydrates to help decrease the risk of diabetes. We will refill Ozempic for 1 month.  - Semaglutide,0.25 or 0.5MG /DOS, (OZEMPIC, 0.25 OR 0.5 MG/DOSE,) 2 MG/1.5ML SOPN; Inject 0.5 mg into the skin once a week.  Dispense: 1.5 mL; Refill: 0  2. Vitamin D deficiency Low Vitamin D level contributes to fatigue and are associated with obesity, breast, and colon cancer. We will refill prescription Vitamin D for 1 month. [name] will follow-up for routine testing of Vitamin D, at least 2-3 times per year to avoid over-replacement.  - Vitamin D, Ergocalciferol, (DRISDOL) 1.25 MG (50000 UNIT) CAPS capsule; Take 1  capsule (50,000 Units total) by mouth every 7 (seven) days.  Dispense: 4 capsule; Refill: 0  3. At risk for diabetes mellitus Caitlin Stark was given approximately 15 minutes of diabetes education and counseling today. We discussed intensive lifestyle modifications today with an emphasis on weight loss as well as increasing exercise and decreasing simple carbohydrates in her diet. We also reviewed medication options with an emphasis on risk versus benefit of those discussed.   Repetitive spaced learning was employed today to elicit superior memory formation and behavioral change.  4. Obesity with current BMI 34.0 Caitlin Stark is currently in the action stage of change. As such, her goal is to continue with weight loss efforts. She has agreed to keeping a food journal and adhering to recommended goals of 1200-1400 calories and 80 grams of protein daily.   Caitlin Stark's goal is to keep all sugar intake below 50 grams daily.  Exercise goals: As is.  Behavioral modification strategies: increasing lean protein intake.  [name] has agreed to follow-up with our clinic in 4 weeks. She was informed of the importance of frequent follow-up visits to maximize her success with intensive lifestyle modifications for her multiple health conditions.   Objective:   Blood pressure 113/68, pulse 61, temperature 97.8 F (36.6 C), height 5\' 7"  (1.702 m), weight 216 lb (98 kg), last menstrual period 09/10/2012, SpO2 98 %. Body mass index is 33.83 kg/m.  General: Cooperative, alert, well developed, in no acute distress. HEENT: Conjunctivae and lids unremarkable. Cardiovascular: Regular rhythm.  Lungs: Normal work of breathing. Neurologic: No focal deficits.   Lab  Results  Component Value Date   CREATININE 1.03 (H) 10/14/2020   BUN 11 10/14/2020   NA 141 10/14/2020   K 4.3 10/14/2020   CL 101 10/14/2020   CO2 22 10/14/2020   Lab Results  Component Value Date   ALT 15 10/14/2020   AST 20 10/14/2020   ALKPHOS 133 (H)  10/14/2020   BILITOT 0.3 10/14/2020   Lab Results  Component Value Date   HGBA1C 5.6 10/14/2020   HGBA1C 5.8 (H) 04/30/2020   HGBA1C 5.5 05/21/2018   HGBA1C 5.7 (H) 01/02/2018   HGBA1C 5.9 (H) 09/06/2017   Lab Results  Component Value Date   INSULIN 14.8 10/14/2020   INSULIN 12.3 04/30/2020   INSULIN 4.5 05/21/2018   INSULIN 10.1 01/02/2018   INSULIN 7.0 09/06/2017   Lab Results  Component Value Date   TSH 2.030 04/30/2020   Lab Results  Component Value Date   CHOL 152 10/14/2020   HDL 45 10/14/2020   LDLCALC 93 10/14/2020   TRIG 71 10/14/2020   Lab Results  Component Value Date   VD25OH 48.7 10/14/2020   VD25OH 41.1 04/30/2020   VD25OH 51.8 05/21/2018   Lab Results  Component Value Date   WBC 8.0 04/30/2020   HGB 13.3 04/30/2020   HCT 41.2 04/30/2020   MCV 86 04/30/2020   PLT 343 04/30/2020   No results found for: IRON, TIBC, FERRITIN  Attestation Statements:   Reviewed by clinician on day of visit: allergies, medications, problem list, medical history, surgical history, family history, social history, and previous encounter notes.   I, Trixie Dredge, am acting as transcriptionist for Dennard Nip, MD.  I have reviewed the above documentation for accuracy and completeness, and I agree with the above. -  Dennard Nip, MD

## 2021-04-19 ENCOUNTER — Encounter (INDEPENDENT_AMBULATORY_CARE_PROVIDER_SITE_OTHER): Payer: BC Managed Care – PPO | Admitting: Family Medicine

## 2021-04-19 DIAGNOSIS — E559 Vitamin D deficiency, unspecified: Secondary | ICD-10-CM

## 2021-04-19 DIAGNOSIS — R7303 Prediabetes: Secondary | ICD-10-CM

## 2021-04-29 ENCOUNTER — Ambulatory Visit (INDEPENDENT_AMBULATORY_CARE_PROVIDER_SITE_OTHER): Payer: BC Managed Care – PPO | Admitting: Family Medicine

## 2021-04-29 ENCOUNTER — Encounter (INDEPENDENT_AMBULATORY_CARE_PROVIDER_SITE_OTHER): Payer: Self-pay | Admitting: Family Medicine

## 2021-04-29 ENCOUNTER — Other Ambulatory Visit: Payer: Self-pay

## 2021-04-29 VITALS — BP 124/78 | HR 63 | Temp 98.1°F | Ht 67.0 in | Wt 220.0 lb

## 2021-04-29 DIAGNOSIS — Z9189 Other specified personal risk factors, not elsewhere classified: Secondary | ICD-10-CM

## 2021-04-29 DIAGNOSIS — E559 Vitamin D deficiency, unspecified: Secondary | ICD-10-CM

## 2021-04-29 DIAGNOSIS — R7303 Prediabetes: Secondary | ICD-10-CM

## 2021-04-29 DIAGNOSIS — E669 Obesity, unspecified: Secondary | ICD-10-CM

## 2021-04-29 DIAGNOSIS — Z6830 Body mass index (BMI) 30.0-30.9, adult: Secondary | ICD-10-CM | POA: Diagnosis not present

## 2021-04-29 MED ORDER — OZEMPIC (0.25 OR 0.5 MG/DOSE) 2 MG/1.5ML ~~LOC~~ SOPN: 0.5000 mg | PEN_INJECTOR | SUBCUTANEOUS | 0 refills | Status: DC

## 2021-04-29 MED ORDER — VITAMIN D (ERGOCALCIFEROL) 1.25 MG (50000 UNIT) PO CAPS: 50000.0000 [IU] | ORAL_CAPSULE | ORAL | 0 refills | Status: DC

## 2021-04-29 NOTE — Progress Notes (Signed)
Chief Complaint:   OBESITY Caitlin Stark is here to discuss her progress with her obesity treatment plan along with follow-up of her obesity related diagnoses. Caitlin Stark is on keeping a food journal and adhering to recommended goals of 1200-1400 calories and 80 grams of protein daily and states she is following her eating plan approximately 90% of the time. Caitlin Stark states she is Caitlin Stark Mood for 60 minutes 3 times per week.  Today's visit was #: 14 Starting weight: 215 lbs Starting date: 04/30/2020 Today's weight: 220 lbs Today's date: 04/29/2021 Total lbs lost to date: 0 Total lbs lost since last in-office visit: 0  Interim History: Caitlin Stark has gotten away from journaling and she is doing more portion control and making smarter choices. She was also on vacation and she did more eating out. She is already getting back on track.  Subjective:   1. Pre-diabetes Caitlin Stark has been off Ozempic for 2 weeks, and she notes some increase in polyphagia.  2. Vitamin D deficiency Caitlin Stark is stable on Vit D, and she denies nausea, vomiting, or muscle weakness.  3. At risk for impaired metabolic function Caitlin Stark is at increased risk for impaired metabolic function if calories or protein is too low.  Assessment/Plan:   1. Pre-diabetes Caitlin Stark agreed to restart Ozempic 0.5 mg q weekly with no refills. She will continue to work on weight loss, exercise, and decreasing simple carbohydrates to help decrease the risk of diabetes.   - Semaglutide,0.25 or 0.'5MG'$ /DOS, (OZEMPIC, 0.25 OR 0.5 MG/DOSE,) 2 MG/1.5ML SOPN; Inject 0.5 mg into the skin once a week.  Dispense: 1.5 mL; Refill: 0  2. Vitamin D deficiency Low Vitamin D level contributes to fatigue and are associated with obesity, breast, and colon cancer. Aloha agreed to continue prescription Vitamin D 50,000 IU every week and we will refill for 1 month. She will follow-up for routine testing of Vitamin D, at least 2-3 times per year to avoid  over-replacement.  - Vitamin D, Ergocalciferol, (DRISDOL) 1.25 MG (50000 UNIT) CAPS capsule; Take 1 capsule (50,000 Units total) by mouth every 7 (seven) days.  Dispense: 4 capsule; Refill: 0  3. At risk for impaired metabolic function Caitlin Stark was given approximately 15 minutes of impaired  metabolic function prevention counseling today. We discussed intensive lifestyle modifications today with an emphasis on specific nutrition and exercise instructions and strategies.   Repetitive spaced learning was employed today to elicit superior memory formation and behavioral change.  4. Obesity with current BMI 34.5 Caitlin Stark is currently in the action stage of change. As such, her goal is to continue with weight loss efforts. She has agreed to keeping a food journal and adhering to recommended goals of 1200-1400 calories and 80+ grams of protein daily.   Exercise goals: As is.  Behavioral modification strategies: increasing lean protein intake and keeping a strict food journal.  Caitlin Stark has agreed to follow-up with our clinic in 3 weeks. She was informed of the importance of frequent follow-up visits to maximize her success with intensive lifestyle modifications for her multiple health conditions.   Blood pressure 124/78, pulse 63, temperature 98.1 F (36.7 C), height '5\' 7"'$  (1.702 m), weight 220 lb (99.8 kg), last menstrual period 09/10/2012, SpO2 97 %. Body mass index is 34.46 kg/m.  General: Cooperative, alert, well developed, in no acute distress. HEENT: Conjunctivae and lids unremarkable. Cardiovascular: Regular rhythm.  Lungs: Normal work of breathing. Neurologic: No focal deficits.   Lab Results  Component Value Date   CREATININE 1.03 (  H) 10/14/2020   BUN 11 10/14/2020   NA 141 10/14/2020   K 4.3 10/14/2020   CL 101 10/14/2020   CO2 22 10/14/2020   Lab Results  Component Value Date   ALT 15 10/14/2020   AST 20 10/14/2020   ALKPHOS 133 (H) 10/14/2020   BILITOT 0.3 10/14/2020    Lab Results  Component Value Date   HGBA1C 5.6 10/14/2020   HGBA1C 5.8 (H) 04/30/2020   HGBA1C 5.5 05/21/2018   HGBA1C 5.7 (H) 01/02/2018   HGBA1C 5.9 (H) 09/06/2017   Lab Results  Component Value Date   INSULIN 14.8 10/14/2020   INSULIN 12.3 04/30/2020   INSULIN 4.5 05/21/2018   INSULIN 10.1 01/02/2018   INSULIN 7.0 09/06/2017   Lab Results  Component Value Date   TSH 2.030 04/30/2020   Lab Results  Component Value Date   CHOL 152 10/14/2020   HDL 45 10/14/2020   LDLCALC 93 10/14/2020   TRIG 71 10/14/2020   Lab Results  Component Value Date   VD25OH 48.7 10/14/2020   VD25OH 41.1 04/30/2020   VD25OH 51.8 05/21/2018   Lab Results  Component Value Date   WBC 8.0 04/30/2020   HGB 13.3 04/30/2020   HCT 41.2 04/30/2020   MCV 86 04/30/2020   PLT 343 04/30/2020   No results found for: IRON, TIBC, FERRITIN  Attestation Statements:   Reviewed by clinician on day of visit: allergies, medications, problem list, medical history, surgical history, family history, social history, and previous encounter notes.   I, Trixie Dredge, am acting as transcriptionist for Dennard Nip, MD.  I have reviewed the above documentation for accuracy and completeness, and I agree with the above. -  Dennard Nip, MD

## 2021-05-24 ENCOUNTER — Other Ambulatory Visit: Payer: Self-pay

## 2021-05-24 ENCOUNTER — Encounter (INDEPENDENT_AMBULATORY_CARE_PROVIDER_SITE_OTHER): Payer: Self-pay | Admitting: Family Medicine

## 2021-05-24 ENCOUNTER — Ambulatory Visit (INDEPENDENT_AMBULATORY_CARE_PROVIDER_SITE_OTHER): Payer: BC Managed Care – PPO | Admitting: Family Medicine

## 2021-05-24 VITALS — BP 122/77 | HR 78 | Temp 98.6°F | Ht 67.0 in | Wt 222.0 lb

## 2021-05-24 DIAGNOSIS — K5900 Constipation, unspecified: Secondary | ICD-10-CM

## 2021-05-24 DIAGNOSIS — E559 Vitamin D deficiency, unspecified: Secondary | ICD-10-CM | POA: Diagnosis not present

## 2021-05-24 DIAGNOSIS — R7303 Prediabetes: Secondary | ICD-10-CM

## 2021-05-24 DIAGNOSIS — E669 Obesity, unspecified: Secondary | ICD-10-CM

## 2021-05-24 DIAGNOSIS — Z6830 Body mass index (BMI) 30.0-30.9, adult: Secondary | ICD-10-CM

## 2021-05-24 DIAGNOSIS — Z9189 Other specified personal risk factors, not elsewhere classified: Secondary | ICD-10-CM

## 2021-05-24 MED ORDER — VITAMIN D (ERGOCALCIFEROL) 1.25 MG (50000 UNIT) PO CAPS: 50000.0000 [IU] | ORAL_CAPSULE | ORAL | 0 refills | Status: DC

## 2021-05-24 MED ORDER — SEMAGLUTIDE (1 MG/DOSE) 4 MG/3ML ~~LOC~~ SOPN: 1.0000 mg | PEN_INJECTOR | SUBCUTANEOUS | 0 refills | Status: DC

## 2021-05-24 NOTE — Progress Notes (Signed)
Chief Complaint:   OBESITY Caitlin Stark is here to discuss her progress with her obesity treatment plan along with follow-up of her obesity related diagnoses. Caitlin Stark is on keeping a food journal and adhering to recommended goals of 1200-1400 calories and 80+ grams of protein daily and states she is following her eating plan approximately 80% of the time. Caitlin Stark states she is doing orange theory for 60 minutes 2 times per week.  Today's visit was #: 15 Starting weight: 215 lbs Starting date: 04/30/2020 Today's weight: 222 lbs Today's date: 05/24/2021 Total lbs lost to date: 0 Total lbs lost since last in-office visit: 0  Interim History: [name] has been on vacation and she did some celebration eating. She is ready to get back to journaling strictly.  Subjective:   1. Constipation, unspecified constipation type Caitlin Stark notes increased constipation while traveling. She is on miralax and sometimes she takes 2 doses in a day to help.  2. Pre-diabetes Caitlin Stark is on Ozempic and she denies nausea or vomiting. She is working on diet but she still struggles with polyphagia.  3. Vitamin D deficiency Caitlin Stark is stable on Vit D, and she denies nausea or vomiting, and she requests a refill today.  4. At risk for diabetes mellitus Caitlin Stark is at higher than average risk for developing diabetes due to obesity.   Assessment/Plan:   1. Constipation, unspecified constipation type Caitlin Stark will continue miralax OTC and increase her water intake. She was informed that a decrease in bowel movement frequency is normal while losing weight, but stools should not be hard or painful. Orders and follow up as documented in patient record.   2. Pre-diabetes Caitlin Stark agreed to increase Ozempic to 1 mg q weekly with no refills. She will continue to work on weight loss, exercise, and decreasing simple carbohydrates to help decrease the risk of diabetes.   - Semaglutide, 1 MG/DOSE, 4 MG/3ML SOPN; Inject 1 mg as directed  once a week.  Dispense: 3 mL; Refill: 0  3. Vitamin D deficiency Low Vitamin D level contributes to fatigue and are associated with obesity, breast, and colon cancer. We will refill prescription Vitamin D for 1 month. Caitlin Stark will follow-up for routine testing of Vitamin D, at least 2-3 times per year to avoid over-replacement.  - Vitamin D, Ergocalciferol, (DRISDOL) 1.25 MG (50000 UNIT) CAPS capsule; Take 1 capsule (50,000 Units total) by mouth every 7 (seven) days.  Dispense: 4 capsule; Refill: 0  4. At risk for diabetes mellitus Caitlin Stark was given approximately 15 minutes of diabetes education and counseling today. We discussed intensive lifestyle modifications today with an emphasis on weight loss as well as increasing exercise and decreasing simple carbohydrates in her diet. We also reviewed medication options with an emphasis on risk versus benefit of those discussed.   Repetitive spaced learning was employed today to elicit superior memory formation and behavioral change.  5. Obesity with current BMI 34.9 Caitlin Stark is currently in the action stage of change. As such, her goal is to continue with weight loss efforts. She has agreed to keeping a food journal and adhering to recommended goals of 1200-1400 calories and 80+ grams of protein daily.   Exercise goals: As is.  Behavioral modification strategies: meal planning and cooking strategies and holiday eating strategies .  Caitlin Stark has agreed to follow-up with our clinic in 3 to 4 weeks. She was informed of the importance of frequent follow-up visits to maximize her success with intensive lifestyle modifications for her multiple health  conditions.   Objective:   Blood pressure 122/77, pulse 78, temperature 98.6 F (37 C), height '5\' 7"'$  (1.702 m), weight 222 lb (100.7 kg), last menstrual period 09/10/2012, SpO2 99 %. Body mass index is 34.77 kg/m.  General: Cooperative, alert, well developed, in no acute distress. HEENT: Conjunctivae and  lids unremarkable. Cardiovascular: Regular rhythm.  Lungs: Normal work of breathing. Neurologic: No focal deficits.   Lab Results  Component Value Date   CREATININE 1.03 (H) 10/14/2020   BUN 11 10/14/2020   NA 141 10/14/2020   K 4.3 10/14/2020   CL 101 10/14/2020   CO2 22 10/14/2020   Lab Results  Component Value Date   ALT 15 10/14/2020   AST 20 10/14/2020   ALKPHOS 133 (H) 10/14/2020   BILITOT 0.3 10/14/2020   Lab Results  Component Value Date   HGBA1C 5.6 10/14/2020   HGBA1C 5.8 (H) 04/30/2020   HGBA1C 5.5 05/21/2018   HGBA1C 5.7 (H) 01/02/2018   HGBA1C 5.9 (H) 09/06/2017   Lab Results  Component Value Date   INSULIN 14.8 10/14/2020   INSULIN 12.3 04/30/2020   INSULIN 4.5 05/21/2018   INSULIN 10.1 01/02/2018   INSULIN 7.0 09/06/2017   Lab Results  Component Value Date   TSH 2.030 04/30/2020   Lab Results  Component Value Date   CHOL 152 10/14/2020   HDL 45 10/14/2020   LDLCALC 93 10/14/2020   TRIG 71 10/14/2020   Lab Results  Component Value Date   VD25OH 48.7 10/14/2020   VD25OH 41.1 04/30/2020   VD25OH 51.8 05/21/2018   Lab Results  Component Value Date   WBC 8.0 04/30/2020   HGB 13.3 04/30/2020   HCT 41.2 04/30/2020   MCV 86 04/30/2020   PLT 343 04/30/2020   No results found for: IRON, TIBC, FERRITIN  Attestation Statements:   Reviewed by clinician on day of visit: allergies, medications, problem list, medical history, surgical history, family history, social history, and previous encounter notes.   I, Trixie Dredge, am acting as transcriptionist for Dennard Nip, MD.  I have reviewed the above documentation for accuracy and completeness, and I agree with the above. -  Dennard Nip, MD

## 2021-06-14 ENCOUNTER — Ambulatory Visit (INDEPENDENT_AMBULATORY_CARE_PROVIDER_SITE_OTHER): Payer: BC Managed Care – PPO | Admitting: Family Medicine

## 2021-06-14 ENCOUNTER — Encounter (INDEPENDENT_AMBULATORY_CARE_PROVIDER_SITE_OTHER): Payer: Self-pay | Admitting: Family Medicine

## 2021-06-14 ENCOUNTER — Other Ambulatory Visit: Payer: Self-pay

## 2021-06-14 VITALS — BP 123/72 | HR 73 | Temp 98.3°F | Ht 67.0 in | Wt 219.0 lb

## 2021-06-14 DIAGNOSIS — E559 Vitamin D deficiency, unspecified: Secondary | ICD-10-CM | POA: Diagnosis not present

## 2021-06-14 DIAGNOSIS — E669 Obesity, unspecified: Secondary | ICD-10-CM | POA: Diagnosis not present

## 2021-06-14 DIAGNOSIS — R7303 Prediabetes: Secondary | ICD-10-CM

## 2021-06-14 DIAGNOSIS — Z9189 Other specified personal risk factors, not elsewhere classified: Secondary | ICD-10-CM

## 2021-06-14 DIAGNOSIS — Z6830 Body mass index (BMI) 30.0-30.9, adult: Secondary | ICD-10-CM | POA: Diagnosis not present

## 2021-06-14 MED ORDER — SEMAGLUTIDE (1 MG/DOSE) 4 MG/3ML ~~LOC~~ SOPN: 1.0000 mg | PEN_INJECTOR | SUBCUTANEOUS | 0 refills | Status: DC

## 2021-06-14 MED ORDER — VITAMIN D (ERGOCALCIFEROL) 1.25 MG (50000 UNIT) PO CAPS: 50000.0000 [IU] | ORAL_CAPSULE | ORAL | 0 refills | Status: DC

## 2021-06-15 NOTE — Progress Notes (Signed)
Chief Complaint:   OBESITY Caitlin Stark is here to discuss her progress with her obesity treatment plan along with follow-up of her obesity related diagnoses. Caitlin Stark is on keeping a food journal and adhering to recommended goals of 1200-1400 calories and 80+ grams of protein daily and states she is following her eating plan approximately 90% of the time. Caitlin Stark states she is doing 0 minutes 0 times per week.  Today's visit was #: 40 Starting weight: 215 lbs Starting date: 04/30/2020 Today's weight: 219 lbs Today's date: 06/14/2021 Total lbs lost to date: 0 Total lbs lost since last in-office visit: 3  Interim History: Caitlin Stark continues to work on diet and exercise, and she has done well with weight loss. She is journaling and working on increasing her protein.  Subjective:   1. Pre-diabetes Caitlin Stark increased her Ozempic to 1 mg and had some nausea, so she did 0.5 mg for 1 day and 0.5 mg the next and this seems to here helped.   2. Vitamin D deficiency Caitlin Stark is stable on Vit D, and she requests a refill.   3. At risk for impaired metabolic function Caitlin Stark is at increased risk for impaired metabolic function is protein decreases.  Assessment/Plan:   1. Pre-diabetes Caitlin Stark will continue to work on weight loss, exercise, and decreasing simple carbohydrates to help decrease the risk of diabetes. We will refill Ozempic for 1 month (she is ok to split apart her doses).  - Semaglutide, 1 MG/DOSE, 4 MG/3ML SOPN; Inject 1 mg as directed once a week.  Dispense: 3 mL; Refill: 0  2. Vitamin D deficiency Low Vitamin D level contributes to fatigue and are associated with obesity, breast, and colon cancer. We will refill prescription Vitamin D for 1 month. Caitlin Stark will follow-up for routine testing of Vitamin D, at least 2-3 times per year to avoid over-replacement.  - Vitamin D, Ergocalciferol, (DRISDOL) 1.25 MG (50000 UNIT) CAPS capsule; Take 1 capsule (50,000 Units total) by mouth every 7  (seven) days.  Dispense: 4 capsule; Refill: 0  3. At risk for impaired metabolic function Caitlin Stark was given approximately 15 minutes of impaired  metabolic function prevention counseling today. We discussed intensive lifestyle modifications today with an emphasis on specific nutrition and exercise instructions and strategies.   Repetitive spaced learning was employed today to elicit superior memory formation and behavioral change.  4. Obesity with current BMI 34.3 Caitlin Stark is currently in the action stage of change. As such, her goal is to continue with weight loss efforts. She has agreed to keeping a food journal and adhering to recommended goals of 1200-1400 calories and 80+ grams of protein daily.   Behavioral modification strategies: keeping a strict food journal.  Caitlin Stark has agreed to follow-up with our clinic in 3 weeks. She was informed of the importance of frequent follow-up visits to maximize her success with intensive lifestyle modifications for her multiple health conditions.   Objective:   Blood pressure 123/72, pulse 73, temperature 98.3 F (36.8 C), height 5\' 7"  (1.702 m), weight 219 lb (99.3 kg), last menstrual period 09/10/2012, SpO2 98 %. Body mass index is 34.3 kg/m.  General: Cooperative, alert, well developed, in no acute distress. HEENT: Conjunctivae and lids unremarkable. Cardiovascular: Regular rhythm.  Lungs: Normal work of breathing. Neurologic: No focal deficits.   Lab Results  Component Value Date   CREATININE 1.03 (H) 10/14/2020   BUN 11 10/14/2020   NA 141 10/14/2020   K 4.3 10/14/2020   CL 101 10/14/2020  CO2 22 10/14/2020   Lab Results  Component Value Date   ALT 15 10/14/2020   AST 20 10/14/2020   ALKPHOS 133 (H) 10/14/2020   BILITOT 0.3 10/14/2020   Lab Results  Component Value Date   HGBA1C 5.6 10/14/2020   HGBA1C 5.8 (H) 04/30/2020   HGBA1C 5.5 05/21/2018   HGBA1C 5.7 (H) 01/02/2018   HGBA1C 5.9 (H) 09/06/2017   Lab Results   Component Value Date   INSULIN 14.8 10/14/2020   INSULIN 12.3 04/30/2020   INSULIN 4.5 05/21/2018   INSULIN 10.1 01/02/2018   INSULIN 7.0 09/06/2017   Lab Results  Component Value Date   TSH 2.030 04/30/2020   Lab Results  Component Value Date   CHOL 152 10/14/2020   HDL 45 10/14/2020   LDLCALC 93 10/14/2020   TRIG 71 10/14/2020   Lab Results  Component Value Date   VD25OH 48.7 10/14/2020   VD25OH 41.1 04/30/2020   VD25OH 51.8 05/21/2018   Lab Results  Component Value Date   WBC 8.0 04/30/2020   HGB 13.3 04/30/2020   HCT 41.2 04/30/2020   MCV 86 04/30/2020   PLT 343 04/30/2020   No results found for: IRON, TIBC, FERRITIN  Attestation Statements:   Reviewed by clinician on day of visit: allergies, medications, problem list, medical history, surgical history, family history, social history, and previous encounter notes.   I, Trixie Dredge, am acting as transcriptionist for Dennard Nip, MD.  I have reviewed the above documentation for accuracy and completeness, and I agree with the above. -  Dennard Nip, MD

## 2021-07-07 ENCOUNTER — Other Ambulatory Visit: Payer: Self-pay

## 2021-07-07 ENCOUNTER — Encounter (INDEPENDENT_AMBULATORY_CARE_PROVIDER_SITE_OTHER): Payer: Self-pay | Admitting: Family Medicine

## 2021-07-07 ENCOUNTER — Ambulatory Visit (INDEPENDENT_AMBULATORY_CARE_PROVIDER_SITE_OTHER): Payer: BC Managed Care – PPO | Admitting: Family Medicine

## 2021-07-07 VITALS — BP 127/69 | HR 61 | Temp 98.2°F | Ht 67.0 in | Wt 216.0 lb

## 2021-07-07 DIAGNOSIS — I10 Essential (primary) hypertension: Secondary | ICD-10-CM

## 2021-07-07 DIAGNOSIS — E669 Obesity, unspecified: Secondary | ICD-10-CM | POA: Diagnosis not present

## 2021-07-07 DIAGNOSIS — Z9189 Other specified personal risk factors, not elsewhere classified: Secondary | ICD-10-CM

## 2021-07-07 DIAGNOSIS — R7303 Prediabetes: Secondary | ICD-10-CM

## 2021-07-07 DIAGNOSIS — Z6833 Body mass index (BMI) 33.0-33.9, adult: Secondary | ICD-10-CM | POA: Diagnosis not present

## 2021-07-07 MED ORDER — SEMAGLUTIDE (1 MG/DOSE) 4 MG/3ML ~~LOC~~ SOPN: 1.0000 mg | PEN_INJECTOR | SUBCUTANEOUS | 0 refills | Status: DC

## 2021-07-08 NOTE — Progress Notes (Signed)
Chief Complaint:   OBESITY Caitlin Stark is here to discuss her progress with her obesity treatment plan along with follow-up of her obesity related diagnoses. Caitlin Stark is on keeping a food journal and adhering to recommended goals of 1200-1400 calories and 80+ grams of protein daily and states she is following her eating plan approximately 80% of the time. Caitlin Stark states she is doing 0 minutes 0 times per week.  Today's visit was #: 49 Starting weight: 215 lbs Starting date: 04/30/2020 Today's weight: 216 lbs Today's date: 07/07/2021 Total lbs lost to date: 0 Total lbs lost since last in-office visit: 3  Interim History: Caitlin Stark continues to do well with weight loss. She notes her hunger is controlled with less food, but she may not be meeting her calorie or protein goals due to decreased appetite.  Subjective:   1. Pre-diabetes Caitlin Stark is doing well on 1 mg of Ozempic per week. She separates the doses and does 0.5 mg on Monday and another 0.5 mg on Tuesday, and she feels this helps her best.  2. Essential hypertension Caitlin Stark's blood pressure is well controlled with diet, exercise, and her medications. She denies signs of hypotension.  3. At risk for impaired metabolic function Caitlin Stark is at increased risk for impaired metabolic function if her calories or protein decreases.  Assessment/Plan:   1. Pre-diabetes Caitlin Stark will continue to work on weight loss, exercise, and decreasing simple carbohydrates to help decrease the risk of diabetes. We will refill Ozempic 1 mg q weekly for 1 month, and we will recheck labs in 1 month.  - Semaglutide, 1 MG/DOSE, 4 MG/3ML SOPN; Inject 1 mg as directed once a week.  Dispense: 3 mL; Refill: 0  2. Essential hypertension Caitlin Stark will continue with her diet, exercise, and weight loss. We will recheck labs in 1 month. We will watch for signs of hypotension as she continues her lifestyle modifications.  3. At risk for impaired metabolic function Caitlin Stark was  given approximately 15 minutes of impaired  metabolic function prevention counseling today. We discussed intensive lifestyle modifications today with an emphasis on specific nutrition and exercise instructions and strategies.   Repetitive spaced learning was employed today to elicit superior memory formation and behavioral change.  4. Obesity with current BMI of 33.9 Caitlin Stark is currently in the action stage of change. As such, her goal is to continue with weight loss efforts. She has agreed to keeping a food journal and adhering to recommended goals of 1200-1400 calories and 80+ grams of protein daily.   Behavioral modification strategies: increasing lean protein intake.  Caitlin Stark has agreed to follow-up with our clinic in 4 weeks. She was informed of the importance of frequent follow-up visits to maximize her success with intensive lifestyle modifications for her multiple health conditions.   Objective:   Blood pressure 127/69, pulse 61, temperature 98.2 F (36.8 C), height 5\' 7"  (1.702 m), weight 216 lb (98 kg), last menstrual period 09/10/2012, SpO2 99 %. Body mass index is 33.83 kg/m.  General: Cooperative, alert, well developed, in no acute distress. HEENT: Conjunctivae and lids unremarkable. Cardiovascular: Regular rhythm.  Lungs: Normal work of breathing. Neurologic: No focal deficits.   Lab Results  Component Value Date   CREATININE 1.03 (H) 10/14/2020   BUN 11 10/14/2020   NA 141 10/14/2020   K 4.3 10/14/2020   CL 101 10/14/2020   CO2 22 10/14/2020   Lab Results  Component Value Date   ALT 15 10/14/2020   AST 20 10/14/2020  ALKPHOS 133 (H) 10/14/2020   BILITOT 0.3 10/14/2020   Lab Results  Component Value Date   HGBA1C 5.6 10/14/2020   HGBA1C 5.8 (H) 04/30/2020   HGBA1C 5.5 05/21/2018   HGBA1C 5.7 (H) 01/02/2018   HGBA1C 5.9 (H) 09/06/2017   Lab Results  Component Value Date   INSULIN 14.8 10/14/2020   INSULIN 12.3 04/30/2020   INSULIN 4.5 05/21/2018    INSULIN 10.1 01/02/2018   INSULIN 7.0 09/06/2017   Lab Results  Component Value Date   TSH 2.030 04/30/2020   Lab Results  Component Value Date   CHOL 152 10/14/2020   HDL 45 10/14/2020   LDLCALC 93 10/14/2020   TRIG 71 10/14/2020   Lab Results  Component Value Date   VD25OH 48.7 10/14/2020   VD25OH 41.1 04/30/2020   VD25OH 51.8 05/21/2018   Lab Results  Component Value Date   WBC 8.0 04/30/2020   HGB 13.3 04/30/2020   HCT 41.2 04/30/2020   MCV 86 04/30/2020   PLT 343 04/30/2020   No results found for: IRON, TIBC, FERRITIN  Attestation Statements:   Reviewed by clinician on day of visit: allergies, medications, problem list, medical history, surgical history, family history, social history, and previous encounter notes.   I, Trixie Dredge, am acting as transcriptionist for Dennard Nip, MD.  I have reviewed the above documentation for accuracy and completeness, and I agree with the above. -  Dennard Nip, MD

## 2021-07-27 ENCOUNTER — Encounter (INDEPENDENT_AMBULATORY_CARE_PROVIDER_SITE_OTHER): Payer: Self-pay | Admitting: Family Medicine

## 2021-07-27 ENCOUNTER — Other Ambulatory Visit (INDEPENDENT_AMBULATORY_CARE_PROVIDER_SITE_OTHER): Payer: Self-pay | Admitting: Family Medicine

## 2021-07-27 DIAGNOSIS — R7303 Prediabetes: Secondary | ICD-10-CM

## 2021-07-27 NOTE — Telephone Encounter (Signed)
Pt last seen by Dr. Beasley.  

## 2021-08-04 ENCOUNTER — Ambulatory Visit (INDEPENDENT_AMBULATORY_CARE_PROVIDER_SITE_OTHER): Payer: BC Managed Care – PPO | Admitting: Physician Assistant

## 2021-08-04 ENCOUNTER — Other Ambulatory Visit: Payer: Self-pay

## 2021-08-04 ENCOUNTER — Encounter (INDEPENDENT_AMBULATORY_CARE_PROVIDER_SITE_OTHER): Payer: Self-pay | Admitting: Physician Assistant

## 2021-08-04 VITALS — BP 130/85 | HR 72 | Temp 98.2°F | Ht 67.0 in | Wt 212.0 lb

## 2021-08-04 DIAGNOSIS — E782 Mixed hyperlipidemia: Secondary | ICD-10-CM

## 2021-08-04 DIAGNOSIS — E559 Vitamin D deficiency, unspecified: Secondary | ICD-10-CM

## 2021-08-04 DIAGNOSIS — E669 Obesity, unspecified: Secondary | ICD-10-CM

## 2021-08-04 DIAGNOSIS — Z6830 Body mass index (BMI) 30.0-30.9, adult: Secondary | ICD-10-CM

## 2021-08-04 DIAGNOSIS — R7303 Prediabetes: Secondary | ICD-10-CM | POA: Diagnosis not present

## 2021-08-04 DIAGNOSIS — Z9189 Other specified personal risk factors, not elsewhere classified: Secondary | ICD-10-CM | POA: Diagnosis not present

## 2021-08-04 MED ORDER — VITAMIN D (ERGOCALCIFEROL) 1.25 MG (50000 UNIT) PO CAPS
50000.0000 [IU] | ORAL_CAPSULE | ORAL | 0 refills | Status: DC
Start: 2021-08-04 — End: 2021-08-25

## 2021-08-04 MED ORDER — SEMAGLUTIDE (1 MG/DOSE) 4 MG/3ML ~~LOC~~ SOPN: 1.0000 mg | PEN_INJECTOR | SUBCUTANEOUS | 0 refills | Status: DC

## 2021-08-04 NOTE — Progress Notes (Signed)
Chief Complaint:   OBESITY Caitlin Stark is here to discuss her progress with her obesity treatment plan along with follow-up of her obesity related diagnoses. Caitlin Stark is on keeping a food journal and adhering to recommended goals of 1200-1400 calories and 80 grams protein and states she is following her eating plan approximately 80% of the time. Caitlin Stark states she is not currently exercising.  Today's visit was #: 18 Starting weight: 215 lbs Starting date: 04/30/2020 Today's weight: 212 lbs Today's date: 08/04/2021 Total lbs lost to date: 3 Total lbs lost since last in-office visit: 4  Interim History: Since increasing Ozempic to 1 mg, Caitlin Stark states she has no appetite. She did not journal the last 2 weeks with the exception of yesterday. She is drinking a shake for lunch. She walks 15,000 steps a day at work on average.  Subjective:   1. Pre-diabetes Pt is on Ozempic 1 mg. She breaks up 0.5 mg twice a week due to intermittent nausea or reflux. Appetite controlled.  2. Vitamin D deficiency Pt is on Vit D weekly. Due for labs.  3. Mixed hyperlipidemia Pt is not on statin therapy. Due for labs.  4. At risk for diabetes mellitus Airyanna is at higher than average risk for developing diabetes due to obesity.   Assessment/Plan:   1. Pre-diabetes Caitlin Stark will continue to work on weight loss, exercise, and decreasing simple carbohydrates to help decrease the risk of diabetes. Check labs today.  Refill- Semaglutide, 1 MG/DOSE, 4 MG/3ML SOPN; Inject 1 mg as directed once a week.  Dispense: 3 mL; Refill: 0  - Comprehensive metabolic panel - Hemoglobin A1c - Insulin, random  2. Vitamin D deficiency Low Vitamin D level contributes to fatigue and are associated with obesity, breast, and colon cancer. She agrees to continue to take prescription Vitamin D 50,000 IU every week and will follow-up for routine testing of Vitamin D, at least 2-3 times per year to avoid over-replacement. Check labs  today.  Refill- Vitamin D, Ergocalciferol, (DRISDOL) 1.25 MG (50000 UNIT) CAPS capsule; Take 1 capsule (50,000 Units total) by mouth every 7 (seven) days.  Dispense: 4 capsule; Refill: 0  - VITAMIN D 25 Hydroxy (Vit-D Deficiency, Fractures)  3. Mixed hyperlipidemia Cardiovascular risk and specific lipid/LDL goals reviewed.  We discussed several lifestyle modifications today and Caitlin Stark will continue to work on diet, exercise and weight loss efforts. Orders and follow up as documented in patient record. Continue with plan.  Counseling Intensive lifestyle modifications are the first line treatment for this issue. Dietary changes: Increase soluble fiber. Decrease simple carbohydrates. Exercise changes: Moderate to vigorous-intensity aerobic activity 150 minutes per week if tolerated. Lipid-lowering medications: see documented in medical record. Check labs today.  - Lipid panel  4. At risk for diabetes mellitus Caitlin Stark was given approximately 15 minutes of diabetes education and counseling today. We discussed intensive lifestyle modifications today with an emphasis on weight loss as well as increasing exercise and decreasing simple carbohydrates in her diet. We also reviewed medication options with an emphasis on risk versus benefit of those discussed.   Repetitive spaced learning was employed today to elicit superior memory formation and behavioral change.  5. Obesity with current BMI 33.2  Caitlin Stark is currently in the action stage of change. As such, her goal is to continue with weight loss efforts. She has agreed to keeping a food journal and adhering to recommended goals of 1200-1400 calories and 90 grams protein.   Exercise goals:  As is  Behavioral modification strategies: meal planning and cooking strategies and keeping healthy foods in the home.  Caitlin Stark has agreed to follow-up with our clinic in 3-4 weeks. She was informed of the importance of frequent follow-up visits to maximize her  success with intensive lifestyle modifications for her multiple health conditions.   Caitlin Stark was informed we would discuss her lab results at her next visit unless there is a critical issue that needs to be addressed sooner. Caitlin Stark agreed to keep her next visit at the agreed upon time to discuss these results.  Objective:   Blood pressure 130/85, pulse 72, temperature 98.2 F (36.8 C), height 5\' 7"  (1.702 m), weight 212 lb (96.2 kg), last menstrual period 09/10/2012, SpO2 97 %. Body mass index is 33.2 kg/m.  General: Cooperative, alert, well developed, in no acute distress. HEENT: Conjunctivae and lids unremarkable. Cardiovascular: Regular rhythm.  Lungs: Normal work of breathing. Neurologic: No focal deficits.   Lab Results  Component Value Date   CREATININE 1.03 (H) 10/14/2020   BUN 11 10/14/2020   NA 141 10/14/2020   K 4.3 10/14/2020   CL 101 10/14/2020   CO2 22 10/14/2020   Lab Results  Component Value Date   ALT 15 10/14/2020   AST 20 10/14/2020   ALKPHOS 133 (H) 10/14/2020   BILITOT 0.3 10/14/2020   Lab Results  Component Value Date   HGBA1C 5.6 10/14/2020   HGBA1C 5.8 (H) 04/30/2020   HGBA1C 5.5 05/21/2018   HGBA1C 5.7 (H) 01/02/2018   HGBA1C 5.9 (H) 09/06/2017   Lab Results  Component Value Date   INSULIN 14.8 10/14/2020   INSULIN 12.3 04/30/2020   INSULIN 4.5 05/21/2018   INSULIN 10.1 01/02/2018   INSULIN 7.0 09/06/2017   Lab Results  Component Value Date   TSH 2.030 04/30/2020   Lab Results  Component Value Date   CHOL 152 10/14/2020   HDL 45 10/14/2020   LDLCALC 93 10/14/2020   TRIG 71 10/14/2020   Lab Results  Component Value Date   VD25OH 48.7 10/14/2020   VD25OH 41.1 04/30/2020   VD25OH 51.8 05/21/2018   Lab Results  Component Value Date   WBC 8.0 04/30/2020   HGB 13.3 04/30/2020   HCT 41.2 04/30/2020   MCV 86 04/30/2020   PLT 343 04/30/2020    Attestation Statements:   Reviewed by clinician on day of visit: allergies,  medications, problem list, medical history, surgical history, family history, social history, and previous encounter notes.  Coral Ceo, CMA, am acting as transcriptionist for [employer], PA-C.  I have reviewed the above documentation for accuracy and completeness, and I agree with the above. Abby Potash, PA-C

## 2021-08-05 LAB — COMPREHENSIVE METABOLIC PANEL
ALT: 13 IU/L (ref 0–32)
AST: 19 IU/L (ref 0–40)
Albumin/Globulin Ratio: 1.7 (ref 1.2–2.2)
Albumin: 4.8 g/dL (ref 3.8–4.9)
Alkaline Phosphatase: 135 IU/L — ABNORMAL HIGH (ref 44–121)
BUN/Creatinine Ratio: 13 (ref 9–23)
BUN: 14 mg/dL (ref 6–24)
Bilirubin Total: 0.3 mg/dL (ref 0.0–1.2)
CO2: 24 mmol/L (ref 20–29)
Calcium: 9.6 mg/dL (ref 8.7–10.2)
Chloride: 103 mmol/L (ref 96–106)
Creatinine, Ser: 1.08 mg/dL — ABNORMAL HIGH (ref 0.57–1.00)
Globulin, Total: 2.8 g/dL (ref 1.5–4.5)
Glucose: 65 mg/dL — ABNORMAL LOW (ref 70–99)
Potassium: 4.6 mmol/L (ref 3.5–5.2)
Sodium: 143 mmol/L (ref 134–144)
Total Protein: 7.6 g/dL (ref 6.0–8.5)
eGFR: 62 mL/min/{1.73_m2} (ref >59)

## 2021-08-05 LAB — HEMOGLOBIN A1C
Est. average glucose Bld gHb Est-mCnc: 120 mg/dL
Hgb A1c MFr Bld: 5.8 % — ABNORMAL HIGH (ref 4.8–5.6)

## 2021-08-05 LAB — VITAMIN D 25 HYDROXY (VIT D DEFICIENCY, FRACTURES): Vit D, 25-Hydroxy: 74.1 ng/mL (ref 30.0–100.0)

## 2021-08-05 LAB — LIPID PANEL
Chol/HDL Ratio: 3.7 ratio (ref 0.0–4.4)
Cholesterol, Total: 152 mg/dL (ref 100–199)
HDL: 41 mg/dL (ref >39)
LDL Chol Calc (NIH): 96 mg/dL (ref 0–99)
Triglycerides: 76 mg/dL (ref 0–149)
VLDL Cholesterol Cal: 15 mg/dL (ref 5–40)

## 2021-08-05 LAB — INSULIN, RANDOM: INSULIN: 11.2 u[IU]/mL (ref 2.6–24.9)

## 2021-08-25 ENCOUNTER — Ambulatory Visit (INDEPENDENT_AMBULATORY_CARE_PROVIDER_SITE_OTHER): Payer: BC Managed Care – PPO | Admitting: Family Medicine

## 2021-08-25 ENCOUNTER — Encounter (INDEPENDENT_AMBULATORY_CARE_PROVIDER_SITE_OTHER): Payer: Self-pay | Admitting: Family Medicine

## 2021-08-25 ENCOUNTER — Other Ambulatory Visit: Payer: Self-pay

## 2021-08-25 VITALS — BP 123/76 | HR 58 | Temp 97.8°F | Ht 67.0 in | Wt 211.0 lb

## 2021-08-25 DIAGNOSIS — E559 Vitamin D deficiency, unspecified: Secondary | ICD-10-CM | POA: Diagnosis not present

## 2021-08-25 DIAGNOSIS — R7303 Prediabetes: Secondary | ICD-10-CM | POA: Diagnosis not present

## 2021-08-25 DIAGNOSIS — Z6830 Body mass index (BMI) 30.0-30.9, adult: Secondary | ICD-10-CM

## 2021-08-25 DIAGNOSIS — Z9189 Other specified personal risk factors, not elsewhere classified: Secondary | ICD-10-CM | POA: Diagnosis not present

## 2021-08-25 DIAGNOSIS — E669 Obesity, unspecified: Secondary | ICD-10-CM

## 2021-08-25 DIAGNOSIS — R7989 Other specified abnormal findings of blood chemistry: Secondary | ICD-10-CM | POA: Diagnosis not present

## 2021-08-25 MED ORDER — SEMAGLUTIDE (1 MG/DOSE) 4 MG/3ML ~~LOC~~ SOPN
1.0000 mg | PEN_INJECTOR | SUBCUTANEOUS | 0 refills | Status: DC
Start: 2021-08-25 — End: 2021-09-27

## 2021-08-25 MED ORDER — VITAMIN D (ERGOCALCIFEROL) 1.25 MG (50000 UNIT) PO CAPS
50000.0000 [IU] | ORAL_CAPSULE | ORAL | 0 refills | Status: DC
Start: 2021-08-25 — End: 2021-09-27

## 2021-08-25 NOTE — Progress Notes (Signed)
Chief Complaint:   OBESITY Caitlin Stark is here to discuss her progress with her obesity treatment plan along with follow-up of her obesity related diagnoses. Caitlin Stark is on keeping a food journal and adhering to recommended goals of 1200-1400 calories and 90 grams of protein daily and states she is following her eating plan approximately 80% of the time. [name] states she is doing 0 minutes 0 times per week.  Today's visit was #: 51 Starting weight: 215 lbs Starting date: 04/30/2020 Today's weight: 211 lbs Today's date: 08/25/2021 Total lbs lost to date: 4 Total lbs lost since last in-office visit: 1  Interim History: Caitlin Stark continues to do well with weight loss despite the holidays. Her hunger is controlled and she is journaling on and off.  Subjective:   1. Pre-diabetes Caitlin Stark is stable on Ozempic. She is taking it at 0.5 mg twice weekly, and she feels she is doing better splitting her dose like this.  2. Vitamin D deficiency Caitlin Stark is on Vit D, and her level is now at goal.  3. Elevated serum creatinine Caitlin Stark is not on ACE/ARB, and she is not taking large amounts of NSAIDS. Her blood pressure is stable but her creatinine is starting to creep up. She hasn't been currently on hydration recently. She is on spironolactone.  4. At risk for dehydration Caitlin Stark is at risk for dehydration due to weight loss and diuretic.  Assessment/Plan:   1. Pre-diabetes [name] will continue to work on weight loss, exercise, and decreasing simple carbohydrates to help decrease the risk of diabetes. We will refill Ozempic for 1 month.  - Semaglutide, 1 MG/DOSE, 4 MG/3ML SOPN; Inject 1 mg as directed once a week.  Dispense: 3 mL; Refill: 0  2. Vitamin D deficiency Low Vitamin D level contributes to fatigue and are associated with obesity, breast, and colon cancer. We will refill prescription Vitamin D for 1 month ([name] is to for 1 month for now). She will follow-up for routine testing of Vitamin D,  at least 2-3 times per year to avoid over-replacement.  - Vitamin D, Ergocalciferol, (DRISDOL) 1.25 MG (50000 UNIT) CAPS capsule; Take 1 capsule (50,000 Units total) by mouth every 7 (seven) days.  Dispense: 4 capsule; Refill: 0  3. Elevated serum creatinine Caitlin Stark is to increase her water intake to at least 80+ oz per day, and we will recheck labs in 3 months.  4. At risk for dehydration Caitlin Stark was given approximately 15 minutes dehydration prevention counseling today. Caitlin Stark is at risk for dehydration due to weight loss and current medication(s). She was encouraged to hydrate and monitor fluid status to avoid dehydration as well as weight loss plateaus.   5. Obesity with current BMI of 33.1 Caitlin Stark is currently in the action stage of change. As such, her goal is to continue with weight loss efforts. She has agreed to keeping a food journal and adhering to recommended goals of 1200-1400 calories and 80+ grams of protein daily.   Behavioral modification strategies: increasing water intake and holiday eating strategies .  Caitlin Stark has agreed to follow-up with our clinic in 4 weeks. She was informed of the importance of frequent follow-up visits to maximize her success with intensive lifestyle modifications for her multiple health conditions.   Objective:   Blood pressure 123/76, pulse (!) 58, temperature 97.8 F (36.6 C), height 5\' 7"  (1.702 m), weight 211 lb (95.7 kg), last menstrual period 09/10/2012, SpO2 98 %. Body mass index is 33.05 kg/m.  General: Cooperative, alert,  well developed, in no acute distress. HEENT: Conjunctivae and lids unremarkable. Cardiovascular: Regular rhythm.  Lungs: Normal work of breathing. Neurologic: No focal deficits.   Lab Results  Component Value Date   CREATININE 1.08 (H) 08/04/2021   BUN 14 08/04/2021   NA 143 08/04/2021   K 4.6 08/04/2021   CL 103 08/04/2021   CO2 24 08/04/2021   Lab Results  Component Value Date   ALT 13 08/04/2021   AST 19  08/04/2021   ALKPHOS 135 (H) 08/04/2021   BILITOT 0.3 08/04/2021   Lab Results  Component Value Date   HGBA1C 5.8 (H) 08/04/2021   HGBA1C 5.6 10/14/2020   HGBA1C 5.8 (H) 04/30/2020   HGBA1C 5.5 05/21/2018   HGBA1C 5.7 (H) 01/02/2018   Lab Results  Component Value Date   INSULIN 11.2 08/04/2021   INSULIN 14.8 10/14/2020   INSULIN 12.3 04/30/2020   INSULIN 4.5 05/21/2018   INSULIN 10.1 01/02/2018   Lab Results  Component Value Date   TSH 2.030 04/30/2020   Lab Results  Component Value Date   CHOL 152 08/04/2021   HDL 41 08/04/2021   LDLCALC 96 08/04/2021   TRIG 76 08/04/2021   CHOLHDL 3.7 08/04/2021   Lab Results  Component Value Date   VD25OH 74.1 08/04/2021   VD25OH 48.7 10/14/2020   VD25OH 41.1 04/30/2020   Lab Results  Component Value Date   WBC 8.0 04/30/2020   HGB 13.3 04/30/2020   HCT 41.2 04/30/2020   MCV 86 04/30/2020   PLT 343 04/30/2020   No results found for: IRON, TIBC, FERRITIN  Attestation Statements:   Reviewed by clinician on day of visit: allergies, medications, problem list, medical history, surgical history, family history, social history, and previous encounter notes.   I, Trixie Dredge, am acting as transcriptionist for Dennard Nip, MD.  I have reviewed the above documentation for accuracy and completeness, and I agree with the above. -  Dennard Nip, MD

## 2021-09-09 ENCOUNTER — Telehealth (INDEPENDENT_AMBULATORY_CARE_PROVIDER_SITE_OTHER): Payer: Self-pay

## 2021-09-09 NOTE — Telephone Encounter (Signed)
Received notification from [email or web address] stating that no PA was needed for patient's Ozempic 4mg /46mL.   Caitlin Stark (Key: [id number]) Rx #: [id number] Ozempic (1 MG/DOSE) 4MG Fayne Mediate pen-injectors   Form: Charity fundraiser PA Form [phone] [facility]) Message from Plan:Your PA has been resolved, no additional PA is required. For further inquiries please contact the number on the back of the member prescription card. (Message [phone])

## 2021-09-13 IMAGING — MG MM DIGITAL DIAGNOSTIC UNILAT*R* W/ TOMO W/ CAD
6 of 12 series · 6 of 36 positions shown · non-contrast
Comparison: Previous exam(s).

CLINICAL DATA: Screening recall for a possible right breast mass.

EXAM:
DIGITAL DIAGNOSTIC UNILATERAL RIGHT MAMMOGRAM WITH CAD AND TOMO

[R MLO synth-2D (1 of 2)]
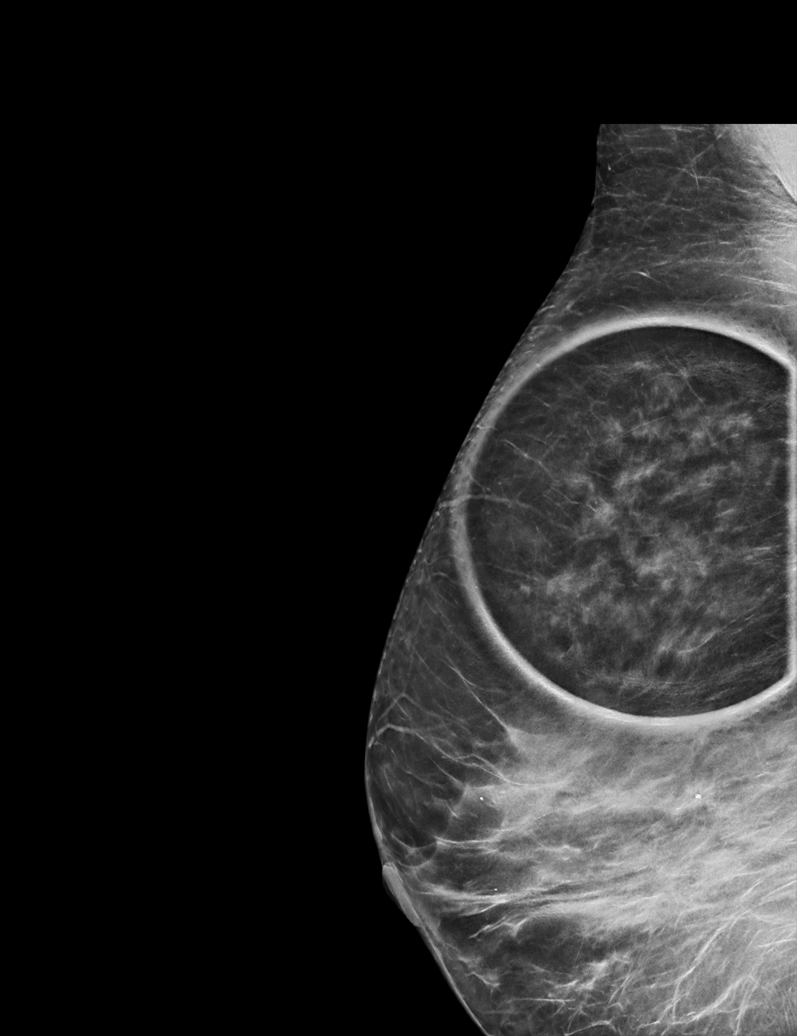

[R CC synth-2D (1 of 3)]
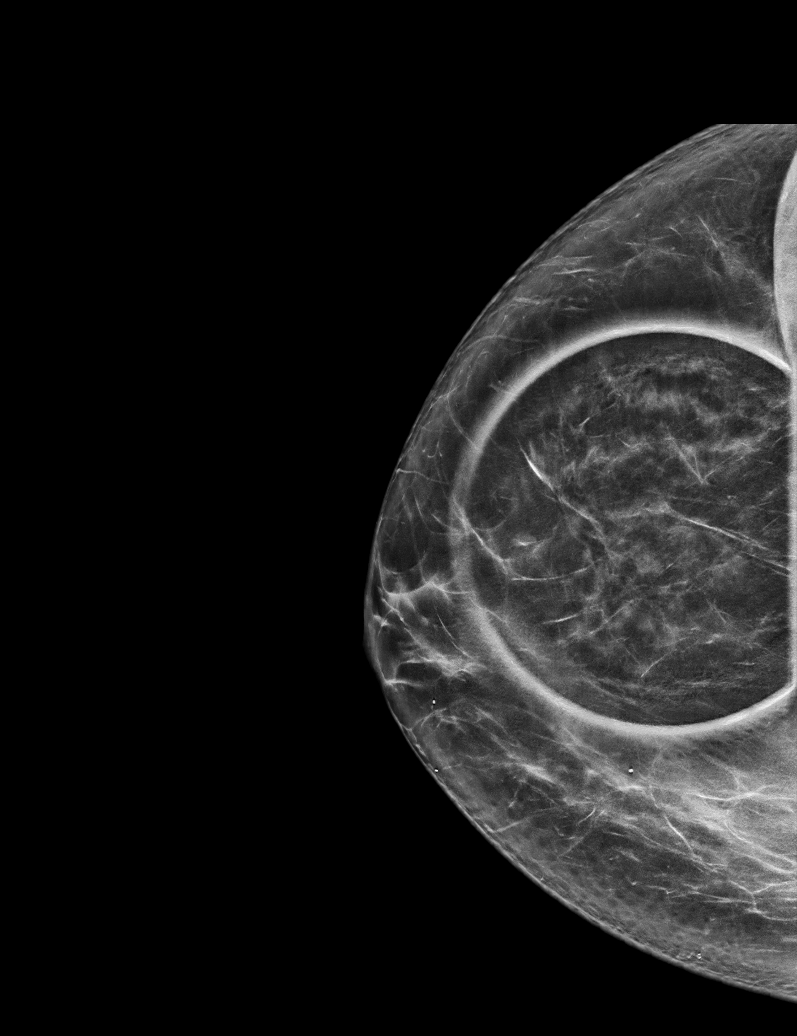

[R ML synth-2D]
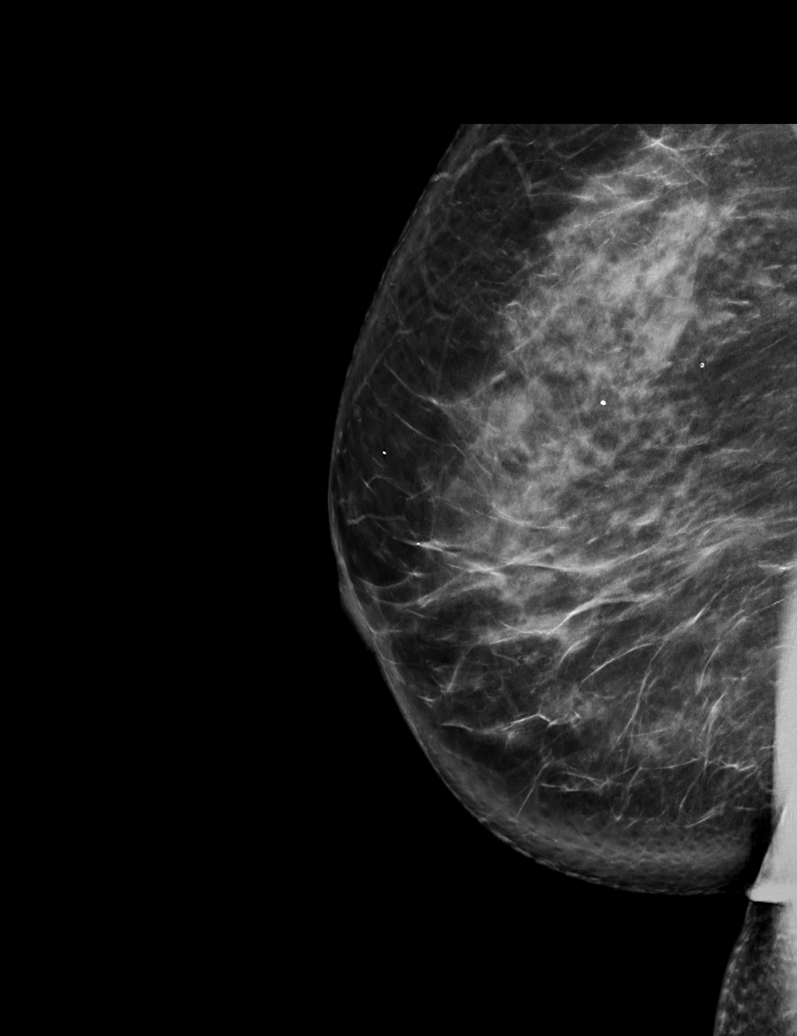

[R CC synth-2D (2 of 3)]
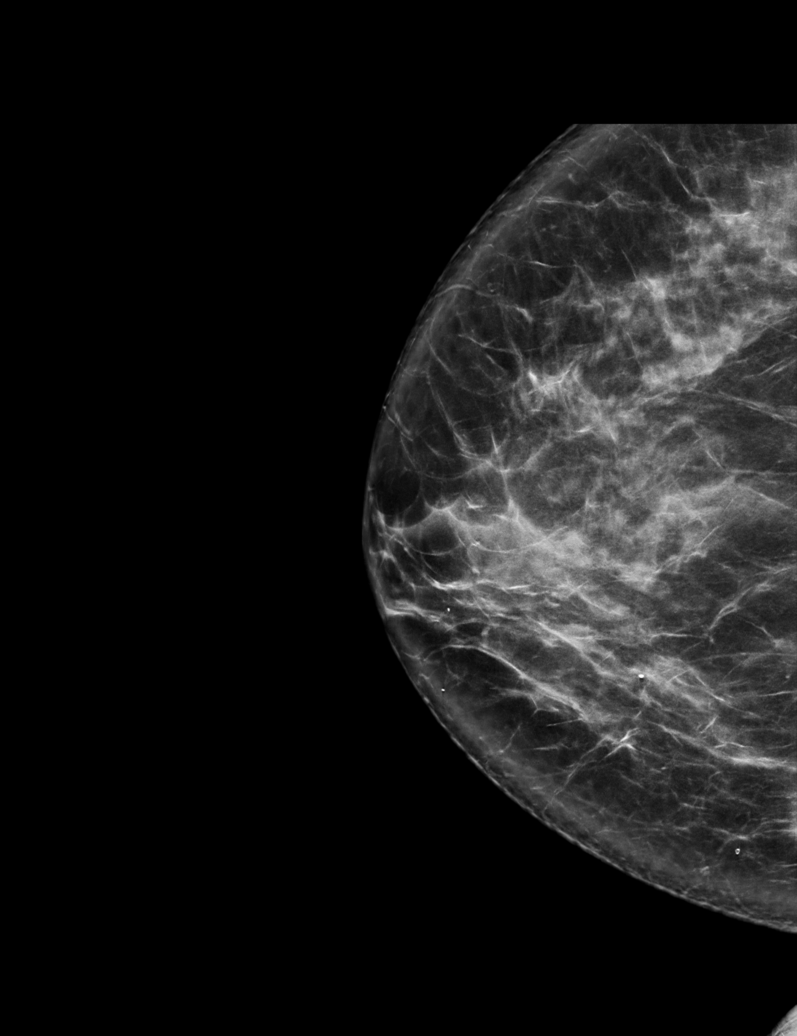

[R MLO synth-2D (2 of 2)]
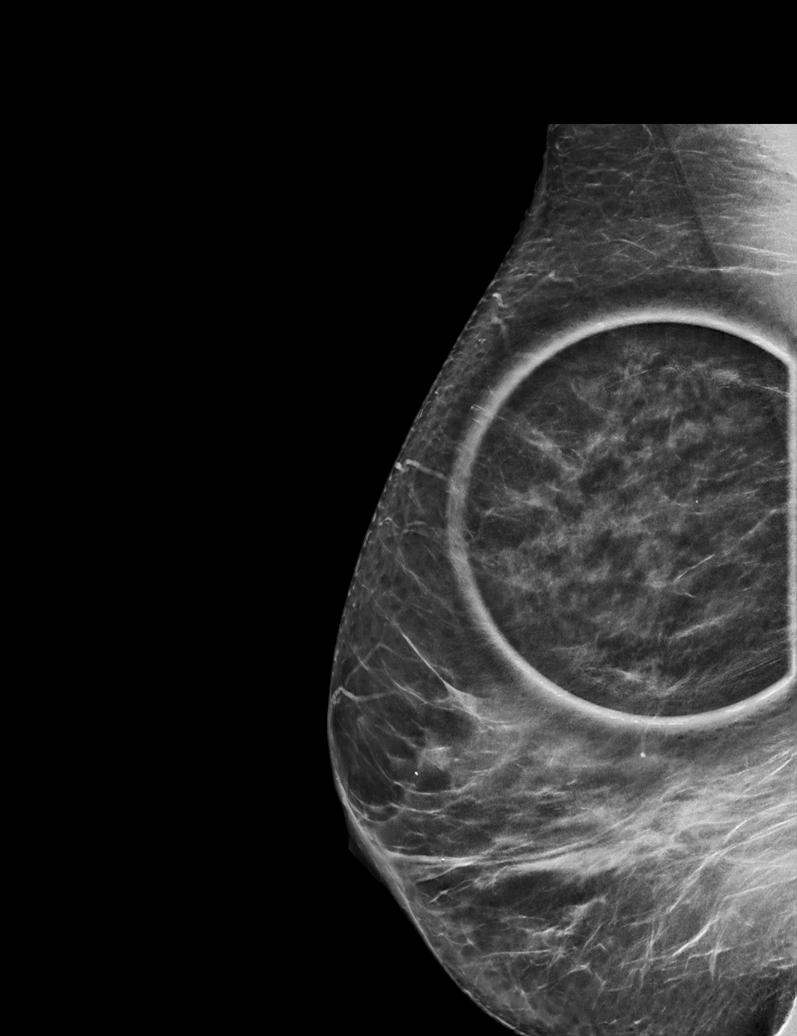

[R CC synth-2D (3 of 3)]
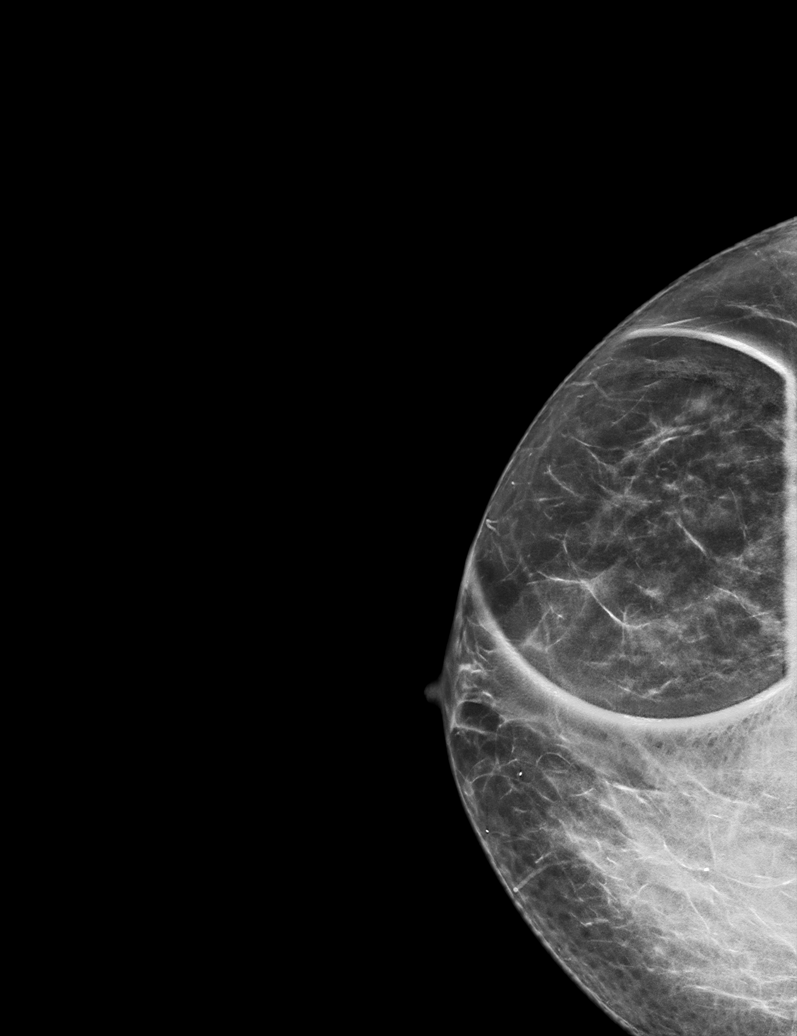

[6 of 36 positions shown; findings below may reference images not displayed]

ACR Breast Density Category c: The breast tissue is heterogeneously
dense, which may obscure small masses.
FINDINGS: Spot compression tomosynthesis images through the lateral right
breast demonstrates resolution the area of concern on the screening
mammogram. A repeat full paddle CC image was performed also
demonstrating resolution the abnormality. Spot compression
tomosynthesis images through the superior central posterior right
breast was performed for an asymmetry which resolves on these
additional images.

Mammographic images were processed with CAD.
IMPRESSION: No persistent abnormalities are seen in the right breast. No
mammographic evidence of malignancy in the right breast.

RECOMMENDATION:
Screening mammogram in one year.(Code:[id number])

I have discussed the findings and recommendations with the patient.
If applicable, a reminder letter will be sent to the patient
regarding the next appointment.

BI-RADS CATEGORY  1: Negative.

## 2021-09-27 ENCOUNTER — Other Ambulatory Visit: Payer: Self-pay

## 2021-09-27 ENCOUNTER — Ambulatory Visit (INDEPENDENT_AMBULATORY_CARE_PROVIDER_SITE_OTHER): Payer: BC Managed Care – PPO | Admitting: Family Medicine

## 2021-09-27 ENCOUNTER — Encounter (INDEPENDENT_AMBULATORY_CARE_PROVIDER_SITE_OTHER): Payer: Self-pay | Admitting: Family Medicine

## 2021-09-27 VITALS — BP 126/77 | HR 69 | Temp 98.0°F | Ht 67.0 in | Wt 211.0 lb

## 2021-09-27 DIAGNOSIS — E559 Vitamin D deficiency, unspecified: Secondary | ICD-10-CM | POA: Diagnosis not present

## 2021-09-27 DIAGNOSIS — Z6833 Body mass index (BMI) 33.0-33.9, adult: Secondary | ICD-10-CM

## 2021-09-27 DIAGNOSIS — E669 Obesity, unspecified: Secondary | ICD-10-CM

## 2021-09-27 DIAGNOSIS — R7303 Prediabetes: Secondary | ICD-10-CM

## 2021-09-27 MED ORDER — VITAMIN D (ERGOCALCIFEROL) 1.25 MG (50000 UNIT) PO CAPS: 50000.0000 [IU] | ORAL_CAPSULE | ORAL | 0 refills | Status: DC

## 2021-09-27 MED ORDER — SEMAGLUTIDE (2 MG/DOSE) 8 MG/3ML ~~LOC~~ SOPN: 2.0000 mg | PEN_INJECTOR | SUBCUTANEOUS | 0 refills | Status: DC

## 2021-09-28 NOTE — Progress Notes (Signed)
Chief Complaint:   OBESITY Caitlin Stark is here to discuss her progress with her obesity treatment plan along with follow-up of her obesity related diagnoses. Caitlin Stark is on keeping a food journal and adhering to recommended goals of 1200-1400 calories and 80+ grams of protein daily and states she is following her eating plan approximately 70% of the time. Caitlin Stark states she is doing 0 minutes 0 times per week.  Today's visit was #: 20 Starting weight: 215 lbs Starting date: 04/30/2020 Today's weight: 211 lbs Today's date: 09/27/2021 Total lbs lost to date: 4 Total lbs lost since last in-office visit: 0  Interim History: Caitlin Stark has done well with avoiding weight gain. She is retaining a bit of fluid today. She has limited exercise due to radiating hip pain suggestive of piriformis syndrome.   Subjective:   1. Pre-diabetes Caitlin Stark is taking Ozempic at 0.5 mg BID, which she feels is more helpful than taking 1 mg at a time. She notes decreased polyphagia, but she is still struggling.  2. Vitamin D deficiency Caitlin Stark is stable on Vit D, with no side effects noted.  Assessment/Plan:   1. Pre-diabetes Caitlin Stark agreed to increase Ozempic to 2 mg weekly (she will increase to 0.5 mg 3 times per week). She will continue to work on weight loss, exercise, and decreasing simple carbohydrates to help decrease the risk of diabetes.   - Semaglutide, 2 MG/DOSE, 8 MG/3ML SOPN; Inject 2 mg as directed once a week.  Dispense: 3 mL; Refill: 0  2. Vitamin D deficiency Low Vitamin D level contributes to fatigue and are associated with obesity, breast, and colon cancer. We will refill prescription Vitamin D for 1 month. Caitlin Stark will follow-up for routine testing of Vitamin D, at least 2-3 times per year to avoid over-replacement.  - Vitamin D, Ergocalciferol, (DRISDOL) 1.25 MG (50000 UNIT) CAPS capsule; Take 1 capsule (50,000 Units total) by mouth every 7 (seven) days.  Dispense: 4 capsule; Refill: 0  3. Obesity,  current BMI 33.0 Caitlin Stark is currently in the action stage of change. As such, her goal is to continue with weight loss efforts. She has agreed to keeping a food journal and adhering to recommended goals of 1200-1400 calories and 80+ grams of protein daily.   Exercise goals: Piriformis stretch prescribed.   Behavioral modification strategies: increasing lean protein intake, no skipping meals, and keeping a strict food journal.  Caitlin Stark has agreed to follow-up with our clinic in 4 weeks. She was informed of the importance of frequent follow-up visits to maximize her success with intensive lifestyle modifications for her multiple health conditions.   Objective:   Blood pressure 126/77, pulse 69, temperature 98 F (36.7 C), height 5\' 7"  (1.702 m), weight 211 lb (95.7 kg), last menstrual period 09/10/2012, SpO2 99 %. Body mass index is 33.05 kg/m.  General: Cooperative, alert, well developed, in no acute distress. HEENT: Conjunctivae and lids unremarkable. Cardiovascular: Regular rhythm.  Lungs: Normal work of breathing. Neurologic: No focal deficits.   Lab Results  Component Value Date   CREATININE 1.08 (H) 08/04/2021   BUN 14 08/04/2021   NA 143 08/04/2021   K 4.6 08/04/2021   CL 103 08/04/2021   CO2 24 08/04/2021   Lab Results  Component Value Date   ALT 13 08/04/2021   AST 19 08/04/2021   ALKPHOS 135 (H) 08/04/2021   BILITOT 0.3 08/04/2021   Lab Results  Component Value Date   HGBA1C 5.8 (H) 08/04/2021   HGBA1C 5.6 10/14/2020  HGBA1C 5.8 (H) 04/30/2020   HGBA1C 5.5 05/21/2018   HGBA1C 5.7 (H) 01/02/2018   Lab Results  Component Value Date   INSULIN 11.2 08/04/2021   INSULIN 14.8 10/14/2020   INSULIN 12.3 04/30/2020   INSULIN 4.5 05/21/2018   INSULIN 10.1 01/02/2018   Lab Results  Component Value Date   TSH 2.030 04/30/2020   Lab Results  Component Value Date   CHOL 152 08/04/2021   HDL 41 08/04/2021   LDLCALC 96 08/04/2021   TRIG 76 08/04/2021   CHOLHDL  3.7 08/04/2021   Lab Results  Component Value Date   VD25OH 74.1 08/04/2021   VD25OH 48.7 10/14/2020   VD25OH 41.1 04/30/2020   Lab Results  Component Value Date   WBC 8.0 04/30/2020   HGB 13.3 04/30/2020   HCT 41.2 04/30/2020   MCV 86 04/30/2020   PLT 343 04/30/2020   No results found for: IRON, TIBC, FERRITIN  Attestation Statements:   Reviewed by clinician on day of visit: allergies, medications, problem list, medical history, surgical history, family history, social history, and previous encounter notes.   I, Trixie Dredge, am acting as transcriptionist for Dennard Nip, MD.  I have reviewed the above documentation for accuracy and completeness, and I agree with the above. -  Dennard Nip, MD

## 2021-10-25 ENCOUNTER — Ambulatory Visit (INDEPENDENT_AMBULATORY_CARE_PROVIDER_SITE_OTHER): Payer: BC Managed Care – PPO | Admitting: Family Medicine

## 2021-10-25 ENCOUNTER — Other Ambulatory Visit: Payer: Self-pay

## 2021-10-25 ENCOUNTER — Encounter (INDEPENDENT_AMBULATORY_CARE_PROVIDER_SITE_OTHER): Payer: Self-pay | Admitting: Family Medicine

## 2021-10-25 VITALS — BP 114/70 | HR 80 | Temp 98.1°F | Ht 67.0 in | Wt 208.0 lb

## 2021-10-25 DIAGNOSIS — I10 Essential (primary) hypertension: Secondary | ICD-10-CM | POA: Diagnosis not present

## 2021-10-25 DIAGNOSIS — E669 Obesity, unspecified: Secondary | ICD-10-CM

## 2021-10-25 DIAGNOSIS — Z6830 Body mass index (BMI) 30.0-30.9, adult: Secondary | ICD-10-CM

## 2021-10-25 DIAGNOSIS — R7303 Prediabetes: Secondary | ICD-10-CM

## 2021-10-25 DIAGNOSIS — Z6832 Body mass index (BMI) 32.0-32.9, adult: Secondary | ICD-10-CM

## 2021-10-25 MED ORDER — SEMAGLUTIDE (2 MG/DOSE) 8 MG/3ML ~~LOC~~ SOPN: 2.0000 mg | PEN_INJECTOR | SUBCUTANEOUS | 0 refills | Status: DC

## 2021-10-25 NOTE — Progress Notes (Signed)
Chief Complaint:   OBESITY Caitlin Stark is here to discuss her progress with her obesity treatment plan along with follow-up of her obesity related diagnoses. Caitlin Stark is on keeping a food journal and adhering to recommended goals of 1200-1400 calories and 80 grams of protein and states she is following her eating plan approximately 70% of the time. [name] states she is not exercising regularly at this time.  Today's visit was #: 21 Starting weight: 215 lbs Starting date: 04/30/2020 Today's weight: 208 lbs Today's date: 10/25/2021 Total lbs lost to date: 7 lbs Total lbs lost since last in-office visit: 3 lbs  Interim History: Caitlin Stark is journaling inconsistently.  She only tracks protein and feels the rest "falls into place".  She drinks half sweet tea occasionally.  Water intake is fair.  She has not been exercising due to sciatica.    Subjective:   1. Pre-diabetes She is taking Ozempic 1.5 mg weekly (0.5 mg 3 times weekly).  Denies nausea.  Constipation minimal with MiraLAX.  Appetite controlled.  She has been on GLP-1 for a few years.  Lab Results  Component Value Date   HGBA1C 5.8 (H) 08/04/2021   Lab Results  Component Value Date   INSULIN 11.2 08/04/2021   INSULIN 14.8 10/14/2020   INSULIN 12.3 04/30/2020   INSULIN 4.5 05/21/2018   INSULIN 10.1 01/02/2018   2. Essential hypertension Well-controlled.  Denies chest pain/shortness of breath.  On diltiazem and spironolactone.  BP Readings from Last 3 Encounters:  10/25/21 114/70  09/27/21 126/77  08/25/21 123/76   Assessment/Plan:   1. Pre-diabetes Continue Ozempic 0.5 mg 3 times per week.  - Refill Semaglutide, 2 MG/DOSE, 8 MG/3ML SOPN; Inject 2 mg as directed once a week.  Dispense: 3 mL; Refill: 0  2. Essential hypertension Continue antihypertensives.   3. Obesity, current BMI 32.57  [name] is currently in the action stage of change. As such, her goal is to continue with weight loss efforts. She has agreed to  keeping a food journal and adhering to recommended goals of 1200-1400 calories and 80 grams of protein.   She will try to be more consistent with journaling.  Exercise goals:  As is.  Behavioral modification strategies: keeping a strict food journal.  Mehar has agreed to follow-up with our clinic in 4 weeks with Dr. Leafy Ro.    Objective:   Blood pressure 114/70, pulse 80, temperature 98.1 F (36.7 C), height 5\' 7"  (1.702 m), weight 208 lb (94.3 kg), last menstrual period 09/10/2012, SpO2 98 %. Body mass index is 32.58 kg/m.  General: Cooperative, alert, well developed, in no acute distress. HEENT: Conjunctivae and lids unremarkable. Cardiovascular: Regular rhythm.  Lungs: Normal work of breathing. Neurologic: No focal deficits.   Lab Results  Component Value Date   CREATININE 1.08 (H) 08/04/2021   BUN 14 08/04/2021   NA 143 08/04/2021   K 4.6 08/04/2021   CL 103 08/04/2021   CO2 24 08/04/2021   Lab Results  Component Value Date   ALT 13 08/04/2021   AST 19 08/04/2021   ALKPHOS 135 (H) 08/04/2021   BILITOT 0.3 08/04/2021   Lab Results  Component Value Date   HGBA1C 5.8 (H) 08/04/2021   HGBA1C 5.6 10/14/2020   HGBA1C 5.8 (H) 04/30/2020   HGBA1C 5.5 05/21/2018   HGBA1C 5.7 (H) 01/02/2018   Lab Results  Component Value Date   INSULIN 11.2 08/04/2021   INSULIN 14.8 10/14/2020   INSULIN 12.3 04/30/2020   INSULIN 4.5  05/21/2018   INSULIN 10.1 01/02/2018   Lab Results  Component Value Date   TSH 2.030 04/30/2020   Lab Results  Component Value Date   CHOL 152 08/04/2021   HDL 41 08/04/2021   LDLCALC 96 08/04/2021   TRIG 76 08/04/2021   CHOLHDL 3.7 08/04/2021   Lab Results  Component Value Date   VD25OH 74.1 08/04/2021   VD25OH 48.7 10/14/2020   VD25OH 41.1 04/30/2020   Lab Results  Component Value Date   WBC 8.0 04/30/2020   HGB 13.3 04/30/2020   HCT 41.2 04/30/2020   MCV 86 04/30/2020   PLT 343 04/30/2020   Attestation Statements:    Reviewed by clinician on day of visit: allergies, medications, problem list, medical history, surgical history, family history, social history, and previous encounter notes.  I, Water quality scientist, CMA, am acting as Location manager for [employer], [place].  I have reviewed the above documentation for accuracy and completeness, and I agree with the above. -  Georgianne Fick, FNP

## 2021-11-15 NOTE — Progress Notes (Signed)
Error

## 2021-11-22 ENCOUNTER — Other Ambulatory Visit: Payer: Self-pay

## 2021-11-22 ENCOUNTER — Ambulatory Visit (INDEPENDENT_AMBULATORY_CARE_PROVIDER_SITE_OTHER): Payer: BC Managed Care – PPO | Admitting: Family Medicine

## 2021-11-22 ENCOUNTER — Encounter (INDEPENDENT_AMBULATORY_CARE_PROVIDER_SITE_OTHER): Payer: Self-pay | Admitting: Family Medicine

## 2021-11-22 VITALS — BP 115/70 | HR 65 | Temp 98.1°F | Ht 67.0 in | Wt 205.0 lb

## 2021-11-22 DIAGNOSIS — Z6832 Body mass index (BMI) 32.0-32.9, adult: Secondary | ICD-10-CM

## 2021-11-22 DIAGNOSIS — M25551 Pain in right hip: Secondary | ICD-10-CM | POA: Diagnosis not present

## 2021-11-22 DIAGNOSIS — E669 Obesity, unspecified: Secondary | ICD-10-CM

## 2021-11-22 DIAGNOSIS — R7303 Prediabetes: Secondary | ICD-10-CM

## 2021-11-22 DIAGNOSIS — E559 Vitamin D deficiency, unspecified: Secondary | ICD-10-CM | POA: Diagnosis not present

## 2021-11-22 MED ORDER — SEMAGLUTIDE (2 MG/DOSE) 8 MG/3ML ~~LOC~~ SOPN: 2.0000 mg | PEN_INJECTOR | SUBCUTANEOUS | 0 refills | Status: DC

## 2021-11-22 MED ORDER — VITAMIN D (ERGOCALCIFEROL) 1.25 MG (50000 UNIT) PO CAPS: 50000.0000 [IU] | ORAL_CAPSULE | ORAL | 0 refills | Status: DC

## 2021-11-24 NOTE — Progress Notes (Signed)
? ? ? ?Chief Complaint:  ? ?OBESITY ?Caitlin Stark is here to discuss her progress with her obesity treatment plan along with follow-up of her obesity related diagnoses. Caitlin Stark is on keeping a food journal and adhering to recommended goals of 1200-1400 calories and 80 grams of protein daily and states she is following her eating plan approximately 80% of the time. Caitlin Stark states she is doing 0 minutes 0 times per week. ? ?Today's visit was #: 81 ?Starting weight: 215 lbs ?Starting date: 04/30/2020 ?Today's weight: 205 lbs ?Today's date: 11/22/2021 ?Total lbs lost to date: 10 ?Total lbs lost since last in-office visit: 3 ? ?Interim History: Karl continues to do well with weight loss. She is working on meeting her protein goals. Her hunger is mostly controlled, and she is working on not skipping meals.  ? ?Subjective:  ? ?1. Pre-diabetes ?Caitlin Stark continues to do well with her diet, exercise, and weight loss. She notes decreased polyphagia, with no side effects noted. ? ?2. Vitamin D deficiency ?Caitlin Stark is stable on Vit d, with no side effects noted. She needs a refill.  ? ?3. Hip pain, right ?Caitlin Stark notes some radiation to her knee, and has been present for weeks. No injury noted. This pain is limiting her exercise. ? ?Assessment/Plan:  ? ?1. Pre-diabetes ?Uyen will continue Ozempic 2 mg, and we will refill for 1 month. ? ?- Semaglutide, 2 MG/DOSE, 8 MG/3ML SOPN; Inject 2 mg as directed once a week.  Dispense: 3 mL; Refill: 0 ? ?2. Vitamin D deficiency ?Tyreona will continue prescription Vitamin D 50,000 IU weekly, and we will refill for 1 month. ? ?- Vitamin D, Ergocalciferol, (DRISDOL) 1.25 MG (50000 UNIT) CAPS capsule; Take 1 capsule (50,000 Units total) by mouth every 7 (seven) days.  Dispense: 4 capsule; Refill: 0 ? ?3. Hip pain, right ?We will refer Muna to Dr. Lynne Leader, Sports Medicine for evaluation and treatment. ? ?- Ambulatory referral to Sports Medicine ? ?4. Obesity with current BMI of 32.2 ?Caitlin Stark is  currently in the action stage of change. As such, her goal is to continue with weight loss efforts. She has agreed to keeping a food journal and adhering to recommended goals of 1200-1400 calories and 85+ grams of protein daily.  ? ?Behavioral modification strategies: increasing lean protein intake and meal planning and cooking strategies. ? ?Caitlin Stark has agreed to follow-up with our clinic in 3 weeks. She was informed of the importance of frequent follow-up visits to maximize her success with intensive lifestyle modifications for her multiple health conditions.  ? ?Objective:  ? ?Blood pressure 115/70, pulse 65, temperature 98.1 ?F (36.7 ?C), height '5\' 7"'$  (1.702 m), weight 205 lb (93 kg), last menstrual period 09/10/2012, SpO2 99 %. ?Body mass index is 32.11 kg/m?. ? ?General: Cooperative, alert, well developed, in no acute distress. ?HEENT: Conjunctivae and lids unremarkable. ?Cardiovascular: Regular rhythm.  ?Lungs: Normal work of breathing. ?Neurologic: No focal deficits.  ? ?Lab Results  ?Component Value Date  ? CREATININE 1.08 (H) 08/04/2021  ? BUN 14 08/04/2021  ? NA 143 08/04/2021  ? K 4.6 08/04/2021  ? CL 103 08/04/2021  ? CO2 24 08/04/2021  ? ?Lab Results  ?Component Value Date  ? ALT 13 08/04/2021  ? AST 19 08/04/2021  ? ALKPHOS 135 (H) 08/04/2021  ? BILITOT 0.3 08/04/2021  ? ?Lab Results  ?Component Value Date  ? HGBA1C 5.8 (H) 08/04/2021  ? HGBA1C 5.6 10/14/2020  ? HGBA1C 5.8 (H) 04/30/2020  ? HGBA1C 5.5 05/21/2018  ?  HGBA1C 5.7 (H) 01/02/2018  ? ?Lab Results  ?Component Value Date  ? INSULIN 11.2 08/04/2021  ? INSULIN 14.8 10/14/2020  ? INSULIN 12.3 04/30/2020  ? INSULIN 4.5 05/21/2018  ? INSULIN 10.1 01/02/2018  ? ?Lab Results  ?Component Value Date  ? TSH 2.030 04/30/2020  ? ?Lab Results  ?Component Value Date  ? CHOL 152 08/04/2021  ? HDL 41 08/04/2021  ? [place] 96 08/04/2021  ? TRIG 76 08/04/2021  ? CHOLHDL 3.7 08/04/2021  ? ?Lab Results  ?Component Value Date  ? VD25OH 74.1 08/04/2021  ? VD25OH  48.7 10/14/2020  ? VD25OH 41.1 04/30/2020  ? ?Lab Results  ?Component Value Date  ? WBC 8.0 04/30/2020  ? HGB 13.3 04/30/2020  ? HCT 41.2 04/30/2020  ? MCV 86 04/30/2020  ? PLT 343 04/30/2020  ? ?No results found for: IRON, TIBC, FERRITIN ? ?Attestation Statements:  ? ?Reviewed by clinician on day of visit: allergies, medications, problem list, medical history, surgical history, family history, social history, and previous encounter notes. ? ? ?I, Trixie Dredge, am acting as transcriptionist for Dennard Nip, MD. ? ?I have reviewed the above documentation for accuracy and completeness, and I agree with the above. -  Dennard Nip, MD ? ? ?

## 2021-11-25 ENCOUNTER — Encounter: Payer: Self-pay | Admitting: Family Medicine

## 2021-11-25 ENCOUNTER — Other Ambulatory Visit: Payer: Self-pay

## 2021-11-25 ENCOUNTER — Ambulatory Visit (INDEPENDENT_AMBULATORY_CARE_PROVIDER_SITE_OTHER): Payer: BC Managed Care – PPO | Admitting: Family Medicine

## 2021-11-25 ENCOUNTER — Ambulatory Visit (INDEPENDENT_AMBULATORY_CARE_PROVIDER_SITE_OTHER): Payer: BC Managed Care – PPO

## 2021-11-25 VITALS — BP 120/80 | HR 76 | Ht 67.0 in | Wt 211.2 lb

## 2021-11-25 DIAGNOSIS — M5441 Lumbago with sciatica, right side: Secondary | ICD-10-CM | POA: Diagnosis not present

## 2021-11-25 DIAGNOSIS — G8929 Other chronic pain: Secondary | ICD-10-CM

## 2021-11-25 NOTE — Patient Instructions (Addendum)
Nice to meet you. ? ?I've referred you to Physical Therapy.  Their office will call you to schedule but please let us know if you haven't heard from them in one week regarding scheduling. ? ?Please get an Xray today before you leave. ? ?Follow-up: 8 weeks ?

## 2021-11-25 NOTE — Progress Notes (Signed)
? ?  I, Peterson Lombard, LAT, ATC acting as a scribe for Lynne Leader, MD. ? ?Subjective:   ? ?CC: R low back pain and R glute pain ? ?HPI: Pt is a 51 y/o female c/o R-sided low back pain x approximately one year. Pt locates pain the R side of her lower back/buttocks w radiating pain into her R thigh  ? ?Low back pain: yes  ?Radiates: yes into her R ant thigh ?Aggravates: transitioning from sitting to standing; yoga pose w/ R leg extended like in a quad stretch position ?Treatments tried:Meloxicam; Advil; Biofreeze; TENs ? ?Pertinent review of Systems: No fevers or chills ? ?Relevant historical information: Hypertension ? ? ?Objective:   ? ?Vitals:  ? 11/25/21 1554  ?BP: 120/80  ?Pulse: 76  ?SpO2: 98%  ? ?General: Well Developed, well nourished, and in no acute distress.  ? ?MSK: L-spine: Normal. ?Nontender midline. ?Mildly tender palpation right lumbar paraspinal musculature. ?Decreased lumbar range of motion especially to extension. ?Positive right-sided slump test. ?Lower extremity strength is intact. ? ?Lab and Radiology Results ? ?X-ray images L-spine obtained today personally and independently interpreted ?DDD L5-S1.  Question posterior element avulsion fracture at S1 ?Await formal radiology review ? ? ?Impression and Recommendations:   ? ?Assessment and Plan: ?51 y.o. female with right low back pain with pain radiating to the right thigh..  Plan for physical therapy.  X-ray today is abnormal per my read.  Await formal radiology over read.  May be obtaining advanced imaging sooner than otherwise would have expected.  Otherwise recheck in 8weeks.. ? ?PDMP not reviewed this encounter. ?Orders Placed This Encounter  ?Procedures  ? DG Lumbar Spine 2-3 Views  ?  Standing Status:   Future  ?  Number of Occurrences:   1  ?  Standing Expiration Date:   12/26/2021  ?  Order Specific Question:   Reason for Exam (SYMPTOM  OR DIAGNOSIS REQUIRED)  ?  Answer:   low back pain  ?  Order Specific Question:   Is patient  pregnant?  ?  Answer:   No  ?  Order Specific Question:   Preferred imaging location?  ?  Answer:   [facility]  ? Ambulatory referral to Physical Therapy  ?  Referral Priority:   Routine  ?  Referral Type:   Physical Medicine  ?  Referral Reason:   Specialty Services Required  ?  Requested Specialty:   Physical Therapy  ?  Number of Visits Requested:   1  ? ?No orders of the defined types were placed in this encounter. ? ? ?Discussed warning signs or symptoms. Please see discharge instructions. Patient expresses understanding. ? ? ?The above documentation has been reviewed and is accurate and complete Lynne Leader, M.D. ? ?

## 2021-11-29 NOTE — Progress Notes (Signed)
Lumbar spine x-ray shows mild arthritis changes.

## 2021-12-01 NOTE — Therapy (Signed)
?OUTPATIENT PHYSICAL THERAPY THORACOLUMBAR EVALUATION ? ? ?Patient Name: Caitlin Stark ?MRN: [id number] ?DOB:15-Jun-1971, 51 y.o., female ?Today's Date: 12/02/2021 ? ? PT End of Session - 12/02/21 1516   ? ? Visit Number 1   ? Authorization Type BCBS   ? Progress Note Due on Visit 10   ? PT Start Time 6433   ? PT Stop Time 2951   ? PT Time Calculation (min) 30 min   ? ?  ?  ? ?  ? ? ?Past Medical History:  ?Diagnosis Date  ? Abnormal Pap smear 1993  ? Anemia   ? Atypical chest pain 11/01/2019  ? Breast mass, right   ? Fibroid   ? Hirsutism   ? Hypertension   ? Menorrhagia   ? Vitamin D deficiency   ? ?Past Surgical History:  ?Procedure Laterality Date  ? ABDOMINOPLASTY    ? CESAREAN SECTION    ? Diastasis Repair  2010  ? HYSTEROSCOPY WITH D & C    ? LIPOSUCTION    ? TUBAL LIGATION    ? ?Patient Active Problem List  ? Diagnosis Date Noted  ? Atypical chest pain 11/01/2019  ? Amenorrhea 02/07/2019  ? Candidal vulvovaginitis 02/07/2019  ? Hirsutism 02/07/2019  ? History of anemia 02/07/2019  ? Irregular periods 02/07/2019  ? Leukorrhea 02/07/2019  ? Menopausal symptom 02/07/2019  ? Pica 02/07/2019  ? Uterine leiomyoma 02/07/2019  ? Vaginal dryness 02/07/2019  ? Other allergic rhinitis 12/26/2018  ? Allergic reaction 12/26/2018  ? Rash and other nonspecific skin eruption 12/26/2018  ? Vitamin D deficiency 09/19/2017  ? Prediabetes 09/19/2017  ? Elevated LFTs 09/19/2017  ? Essential hypertension 09/19/2017  ? Class 1 obesity with serious comorbidity and body mass index (BMI) of 33.0 to 33.9 in adult 09/19/2017  ? Breast lump 09/18/2011  ? ? ?PCP: Seward Carol, MD ? ?REFERRING PROVIDER: Gregor Hams, MD ? ?REFERRING DIAG: M54.41,G89.29 (ICD-10-CM) - Chronic right-sided low back pain with right-sided sciatica ? ?THERAPY DIAG:  ?Muscle weakness (generalized) ? ?Other low back pain ? ?ONSET DATE: 1 year ago ? ?SUBJECTIVE:                                                                                                                                                                                           ? ?SUBJECTIVE STATEMENT: ?States that she had back pain for about a year and it was kind of faint. States that one time she was running on the treadmill and her right leg felt weak so she backed down. States that she thought it was the mattress but they haven't changed it yet. States she  did participate in orange theory but she put it on hold because of her pain. States she runs and loves to run but hasn't run for 3 weeks but likes to run 1-2 miles. Overall she is frustrated as she wants to stay active but can't because of her pain. ? ?PERTINENT HISTORY:  ?C-section ? ?PAIN:  ?Are you having pain? Yes: NPRS scale: 3/10 ?Pain location: right low back and in the right knee (top of knee) ?Pain description: catch in her back  ?Aggravating factors: transitioning from STS, yoga pose with right leg extended (quad stretch position- dancer pose), in the morning ?Relieving factors: movement ? ? ?PRECAUTIONS: None ? ?WEIGHT BEARING RESTRICTIONS No ? ?FALLS:  ?Has patient fallen in last 6 months? No ? ?OCCUPATION: PE teacher ? ?PLOF: Independent ? ?PATIENT GOALS to take the pain to go away. ? ? ?OBJECTIVE:  ? ?DIAGNOSTIC FINDINGS:  ?Xray 11/25/21 ?IMPRESSION: ?Mild multilevel degenerative changes ? ? ?COGNITION: ? Overall cognitive status: Within functional limits for tasks assessed   ?  ?MUSCLE LENGTH: ?Hamstrings: Right 30 deg; Left 30 deg ? ?POSTURE:  ?hitches at L4/5 ? ?PALPATION: ?Tenderness to palpation along right lumbar paraspinals and glutes, hypomobility throughout lumbar spine ? ?LUMBAR ROM:  ? ?Active  A/PROM  ?12/02/2021  ?Flexion 25% limited (painful along left low back)  ?Extension Hinges at L4/5 50% limited  ?Right lateral flexion Pinching on right 50% limited  ?Left lateral flexion Stretching on right 50% limited  ?Right rotation   ?Left rotation   ? (Blank rows = not tested) ? ? ? ? LE Measurements ?Lower Extremity  Right ?12/02/2021 Left ?12/02/2021  ? A/PROM MMT A/PROM MMT  ?Hip Flexion WNL 4-  4  ?Hip Extension '10 3 10 3  '$ ?Hip Abduction      ?Hip Adduction      ?Hip Internal rotation WNL*  WNL^   ?Hip External rotation      ?Knee Flexion WNL 3+ WNL 4+  ?Knee Extension WNL 4 WNL 4+  ?Ankle Dorsiflexion  5  5  ?Ankle Plantarflexion      ?Ankle Inversion      ?Ankle Eversion      ? (Blank rows = not tested) ? * pain ?^ guarded motion ? ? ?LUMBAR SPECIAL TESTS:  ?Straight leg raise test: neg, Slump test: Positive with left extended pain on right , and FABER test: negative, ely's test + on right ? ? ? ? ?TODAY'S TREATMENT  ?12/02/2021 ?Therapeutic Exercise: ? Aerobic: ?Supine: ?Prone: knee flexion x20 5"holds  ? Seated: ? Standing: ?Neuromuscular Re-education: ?Manual Therapy: ?Therapeutic Activity: ?Self Care: ?Trigger Point Dry Needling:  ?Modalities:  ? ? ? ?PATIENT EDUCATION:  ?Education details: on current presentation, on HEP, on rationale for exercises and POC ?Person educated: Patient ?Education method: Explanation, Demonstration, and Handouts ?Education comprehension: verbalized understanding ? ? ?HOME EXERCISE PROGRAM: ?[id number] ? ?ASSESSMENT: ? ?CLINICAL IMPRESSION: ?Patient is a 51 y.o. female who was seen today for physical therapy evaluation and treatment for low back pain referring into right lower extremity. Patient presents with weakness bilaterally with right leg greater than left, ROM limitations and pain with functional movements. Educated patient on current presentation and POC moving forward. Patient would benefit from skilled PT to improve overall function and quality of life and to reduce risk of further injury. ? ? ?OBJECTIVE IMPAIRMENTS decreased activity tolerance, decreased ROM, decreased strength, improper body mechanics, postural dysfunction, and pain.  ? ?ACTIVITY LIMITATIONS yard work and working out, running .  ? ?PERSONAL  FACTORS Fitness and Time since onset of injury/illness/exacerbation are also  affecting patient's functional outcome.  ? ? ?REHAB POTENTIAL: Excellent ? ?CLINICAL DECISION MAKING: Stable/uncomplicated ? ?EVALUATION COMPLEXITY: Low ? ?GOALS: ?Goals reviewed with patient?  yes ? ?SHORT TERM GOALS: ? ?Patient will be independent in self management strategies to improve quality of life and functional outcomes. ?Baseline: new program ?Target date: 12/30/2021 ?Goal status: INITIAL ? ?2.  Patient will report at least 50% improvement in overall symptoms and/or function to demonstrate improved functional mobility ?Baseline: 0% ?Target date: 12/30/2021 ?Goal status: INITIAL ? ?3.  Patient will be able to perform painfree lumbar ROM  ?Baseline: painful ?Target date: 12/30/2021 ?Goal status: INITIAL ? ? ? ? ? ? ?LONG TERM GOALS: ? ?Patient will report at least 75% improvement in overall symptoms and/or function to demonstrate improved functional mobility ?Baseline: 0% ?Target date: 01/27/2022 ?Goal status: INITIAL ? ?2.  Patient will be able to wake up without pain to improve sleep quality ?Baseline: painful ?Target date: 01/27/2022 ?Goal status: INITIAL ? ?3.  Patient will demonstrate at least 4/5 MMT strength throughout lower legs to demonstrate improved functional strength. ? ?Target date: 01/27/2022 ?Goal status: INITIAL ? ? ? ?PLAN: ?PT FREQUENCY: 1-2x/week for a total of 12 visits over 8 week certification ? ?PT DURATION: 8 weeks ? ?PLANNED INTERVENTIONS: Therapeutic exercises, Therapeutic activity, Neuromuscular re-education, Balance training, Gait training, Patient/Family education, Joint mobilization, Vestibular training, Aquatic Therapy, Dry Needling, Electrical stimulation, Spinal manipulation, Cryotherapy, and Manual therapy. ? ?PLAN FOR NEXT SESSION: quad stretch, hamstring/glute strengthening. STM and stretching to lumbar and glutes ? ?3:25 PM, 12/02/21 ?Jerene Pitch, DPT ?Physical Therapy with [place] ?[facility]  ?[phone] office ? ?

## 2021-12-02 ENCOUNTER — Encounter: Payer: Self-pay | Admitting: Physical Therapy

## 2021-12-02 ENCOUNTER — Ambulatory Visit: Payer: BC Managed Care – PPO | Admitting: Physical Therapy

## 2021-12-02 DIAGNOSIS — M6281 Muscle weakness (generalized): Secondary | ICD-10-CM

## 2021-12-02 DIAGNOSIS — M5459 Other low back pain: Secondary | ICD-10-CM | POA: Diagnosis not present

## 2021-12-02 NOTE — Therapy (Signed)
?OUTPATIENT PHYSICAL THERAPY TREATMENT NOTE ? ? ?Patient Name: Caitlin Stark ?MRN: [id number] ?DOB:11/07/1970, 51 y.o., female ?Today's Date: 12/08/2021 ? ?PCP: Seward Carol, MD ?REFERRING PROVIDER: Seward Carol, MD ? ? PT End of Session - 12/08/21 1603   ? ? Visit Number 2   ? Number of Visits 12   ? Date for PT Re-Evaluation 01/27/22   ? Authorization Type BCBS   ? Progress Note Due on Visit 10   ? PT Start Time 1941   ? PT Stop Time 7408   ? PT Time Calculation (min) 40 min   ? ?  ?  ? ?  ? ? ?Past Medical History:  ?Diagnosis Date  ? Abnormal Pap smear 1993  ? Anemia   ? Atypical chest pain 11/01/2019  ? Breast mass, right   ? Fibroid   ? Hirsutism   ? Hypertension   ? Menorrhagia   ? Vitamin D deficiency   ? ?Past Surgical History:  ?Procedure Laterality Date  ? ABDOMINOPLASTY    ? CESAREAN SECTION    ? Diastasis Repair  2010  ? HYSTEROSCOPY WITH D & C    ? LIPOSUCTION    ? TUBAL LIGATION    ? ?Patient Active Problem List  ? Diagnosis Date Noted  ? Atypical chest pain 11/01/2019  ? Amenorrhea 02/07/2019  ? Candidal vulvovaginitis 02/07/2019  ? Hirsutism 02/07/2019  ? History of anemia 02/07/2019  ? Irregular periods 02/07/2019  ? Leukorrhea 02/07/2019  ? Menopausal symptom 02/07/2019  ? Pica 02/07/2019  ? Uterine leiomyoma 02/07/2019  ? Vaginal dryness 02/07/2019  ? Other allergic rhinitis 12/26/2018  ? Allergic reaction 12/26/2018  ? Rash and other nonspecific skin eruption 12/26/2018  ? Vitamin D deficiency 09/19/2017  ? Prediabetes 09/19/2017  ? Elevated LFTs 09/19/2017  ? Essential hypertension 09/19/2017  ? Class 1 obesity with serious comorbidity and body mass index (BMI) of 33.0 to 33.9 in adult 09/19/2017  ? Breast lump 09/18/2011  ? ? ? ?PCP: Seward Carol, MD ? ?REFERRING PROVIDER: Gregor Hams, MD ? ?REFERRING DIAG: M54.41,G89.29 (ICD-10-CM) - Chronic right-sided low back pain with right-sided sciatica ? ?THERAPY DIAG:  ?Muscle weakness (generalized) ? ?Other low back pain ? ?ONSET DATE: 1 year  ago ? ?SUBJECTIVE:                                                                                                                                                                                          ? ?SUBJECTIVE STATEMENT: ?12/08/2021 ?States that she had some bad back pain this morning into her right knee and this lasted for about 20 minutes no current pain. 5/10  pain reported this morning. ?Eval: States that she had back pain for about a year and it was kind of faint. States that one time she was running on the treadmill and her right leg felt weak so she backed down. States that she thought it was the mattress but they haven't changed it yet. States she did participate in orange theory but she put it on hold because of her pain. States she runs and loves to run but hasn't run for 3 weeks but likes to run 1-2 miles. Overall she is frustrated as she wants to stay active but can't because of her pain. ? ?PERTINENT HISTORY:  ?C-section ? ?PAIN:  ?Are you having pain? Yes: NPRS scale: 3/10 ?Pain location: right low back and in the right knee (top of knee) ?Pain description: catch in her back  ?Aggravating factors: transitioning from STS, yoga pose with right leg extended (quad stretch position- dancer pose), in the morning ?Relieving factors: movement ? ? ?PRECAUTIONS: None ? ?WEIGHT BEARING RESTRICTIONS No ? ?FALLS:  ?Has patient fallen in last 6 months? No ? ?OCCUPATION: PE teacher ? ?PLOF: Independent ? ?PATIENT GOALS to take the pain to go away. ? ? ?OBJECTIVE:  ? ?DIAGNOSTIC FINDINGS:  ?Xray 11/25/21 ?IMPRESSION: ?Mild multilevel degenerative changes ? ? ?COGNITION: ? Overall cognitive status: Within functional limits for tasks assessed   ?  ?MUSCLE LENGTH: ?Hamstrings: Right 30 deg; Left 30 deg ? ?POSTURE:  ?hitches at L4/5 ? ?PALPATION: ?Tenderness to palpation along right lumbar paraspinals and glutes, hypomobility throughout lumbar spine ? ?LUMBAR ROM:  ? ?Active  A/PROM  ?12/02/2021  ?Flexion 25% limited  (painful along left low back)  ?Extension Hinges at L4/5 50% limited  ?Right lateral flexion Pinching on right 50% limited  ?Left lateral flexion Stretching on right 50% limited  ?Right rotation   ?Left rotation   ? (Blank rows = not tested) ? ? ? ? LE Measurements ?Lower Extremity Right ?12/02/2021 Left ?12/02/2021  ? A/PROM MMT A/PROM MMT  ?Hip Flexion WNL 4  4+  ?Hip Extension '10 3 10 3  '$ ?Hip Abduction      ?Hip Adduction      ?Hip Internal rotation WNL*  WNL^   ?Hip External rotation      ?Knee Flexion WNL 3+ WNL 4+  ?Knee Extension WNL 4 WNL 4+  ?Ankle Dorsiflexion  5  5  ?Ankle Plantarflexion      ?Ankle Inversion      ?Ankle Eversion      ? (Blank rows = not tested) ? * pain ?^ guarded motion ? ? ?LUMBAR SPECIAL TESTS:  ?Straight leg raise test: neg, Slump test: Positive with left extended pain on right , and FABER test: negative, ely's test + on right ? ? ? ? ?TODAY'S TREATMENT  ?12/08/2021 ?Therapeutic Exercise: ? Aerobic: ?Supine: LTR 2 minutes on ball, hamstring iso on ball 5" holds 2 minutes, [place] on ball 2 minutes, bridges 3x10 2" holds ?Prone: knee flexion x20 5"holds, piriformis stretch 10" holds 90 seconds total bilateral with strap  ? Seated: ? Standing: ?Neuromuscular Re-education: ?Manual Therapy: gentle lumbar traction on ball 8 minutes total, percussion gun to right hip region 2 minutes as demo ?Therapeutic Activity: ?Self Care: ?Trigger Point Dry Needling:  ?Modalities:  ? ? ? ?PATIENT EDUCATION:  ?Education details: on rationale for exercises, different ways to determine if mattress is contributing to pain, on rationale and benefits for TENS and percussion gun use  ?Person educated: Patient ?Education method: Explanation, Demonstration, and  Handouts ?Education comprehension: verbalized understanding ? ? ?HOME EXERCISE PROGRAM: ?[id number] ? ?ASSESSMENT: ? ?CLINICAL IMPRESSION: ?12/08/2021 ?Patient with uncertainty with all exercises. Focused on educating and explaining rationale for all exercises.  Focused on decompression exercises which were tolerated well with no pain. Educated patietn on benefits of TENS and percussion gun as patient has both at home and inquired about them. Discussed possible benefits of foam mattress pad for improved sleep quality. Added all new exercises to HEP.  ? ?Eval: Patient is a 51 y.o. female who was seen today for physical therapy evaluation and treatment for low back pain referring into right lower extremity. Patient presents with weakness bilaterally with right leg greater than left, ROM limitations and pain with functional movements. Educated patient on current presentation and POC moving forward. Patient would benefit from skilled PT to improve overall function and quality of life and to reduce risk of further injury. ? ? ?OBJECTIVE IMPAIRMENTS decreased activity tolerance, decreased ROM, decreased strength, improper body mechanics, postural dysfunction, and pain.  ? ?ACTIVITY LIMITATIONS yard work and working out, running.  ? ?PERSONAL FACTORS Fitness and Time since onset of injury/illness/exacerbation are also affecting patient's functional outcome.  ? ? ?REHAB POTENTIAL: Excellent ? ?CLINICAL DECISION MAKING: Stable/uncomplicated ? ?EVALUATION COMPLEXITY: Low ? ?GOALS: ?Goals reviewed with patient?  yes ? ?SHORT TERM GOALS: ? ?Patient will be independent in self management strategies to improve quality of life and functional outcomes. ?Baseline: new program ?Target date: 12/30/2021 ?Goal status: INITIAL ? ?2.  Patient will report at least 50% improvement in overall symptoms and/or function to demonstrate improved functional mobility ?Baseline: 0% ?Target date: 12/30/2021 ?Goal status: INITIAL ? ?3.  Patient will be able to perform painfree lumbar ROM  ?Baseline: painful ?Target date: 12/30/2021 ?Goal status: INITIAL ? ? ? ? ? ? ?LONG TERM GOALS: ? ?Patient will report at least 75% improvement in overall symptoms and/or function to demonstrate improved functional  mobility ?Baseline: 0% ?Target date: 01/27/2022 ?Goal status: INITIAL ? ?2.  Patient will be able to wake up without pain to improve sleep quality ?Baseline: painful ?Target date: 01/27/2022 ?Goal status: INITIAL ? ?3.

## 2021-12-08 ENCOUNTER — Encounter: Payer: Self-pay | Admitting: Physical Therapy

## 2021-12-08 ENCOUNTER — Ambulatory Visit: Payer: BC Managed Care – PPO | Admitting: Physical Therapy

## 2021-12-08 DIAGNOSIS — M6281 Muscle weakness (generalized): Secondary | ICD-10-CM

## 2021-12-08 DIAGNOSIS — M5459 Other low back pain: Secondary | ICD-10-CM

## 2021-12-15 ENCOUNTER — Encounter: Payer: BC Managed Care – PPO | Admitting: Physical Therapy

## 2021-12-16 ENCOUNTER — Encounter (INDEPENDENT_AMBULATORY_CARE_PROVIDER_SITE_OTHER): Payer: Self-pay | Admitting: Family Medicine

## 2021-12-16 ENCOUNTER — Telehealth: Payer: Self-pay | Admitting: Physical Therapy

## 2021-12-16 ENCOUNTER — Ambulatory Visit (INDEPENDENT_AMBULATORY_CARE_PROVIDER_SITE_OTHER): Payer: BC Managed Care – PPO | Admitting: Family Medicine

## 2021-12-16 VITALS — BP 106/65 | HR 61 | Temp 97.9°F | Ht 67.0 in | Wt 204.0 lb

## 2021-12-16 DIAGNOSIS — E559 Vitamin D deficiency, unspecified: Secondary | ICD-10-CM | POA: Diagnosis not present

## 2021-12-16 DIAGNOSIS — E119 Type 2 diabetes mellitus without complications: Secondary | ICD-10-CM

## 2021-12-16 DIAGNOSIS — E669 Obesity, unspecified: Secondary | ICD-10-CM | POA: Diagnosis not present

## 2021-12-16 DIAGNOSIS — R7303 Prediabetes: Secondary | ICD-10-CM | POA: Diagnosis not present

## 2021-12-16 DIAGNOSIS — Z6832 Body mass index (BMI) 32.0-32.9, adult: Secondary | ICD-10-CM

## 2021-12-16 MED ORDER — SEMAGLUTIDE (2 MG/DOSE) 8 MG/3ML ~~LOC~~ SOPN: 2.0000 mg | PEN_INJECTOR | SUBCUTANEOUS | 0 refills | Status: DC

## 2021-12-16 MED ORDER — VITAMIN D (ERGOCALCIFEROL) 1.25 MG (50000 UNIT) PO CAPS: 50000.0000 [IU] | ORAL_CAPSULE | ORAL | 0 refills | Status: DC

## 2021-12-16 NOTE — Telephone Encounter (Signed)
No Show. Called and patient forgot about apt and fell asleep after a hike. Confirmed next apt with pt. ? ?8:39 AM, 12/16/21 ?Caitlin Stark, DPT ?Physical Therapy with [place] ?[facility]  ?[phone] office ? ?

## 2021-12-17 LAB — VITAMIN D 25 HYDROXY (VIT D DEFICIENCY, FRACTURES): Vit D, 25-Hydroxy: 51.2 ng/mL (ref 30.0–100.0)

## 2021-12-22 ENCOUNTER — Encounter: Payer: Self-pay | Admitting: Physical Therapy

## 2021-12-22 ENCOUNTER — Ambulatory Visit: Payer: BC Managed Care – PPO | Admitting: Physical Therapy

## 2021-12-22 DIAGNOSIS — M6281 Muscle weakness (generalized): Secondary | ICD-10-CM

## 2021-12-22 DIAGNOSIS — M5459 Other low back pain: Secondary | ICD-10-CM

## 2021-12-22 NOTE — Therapy (Signed)
?OUTPATIENT PHYSICAL THERAPY TREATMENT NOTE ? ? ?Patient Name: Caitlin Stark ?MRN: [id number] ?DOB:Mar 04, 1971, 51 y.o., female ?Today's Date: 12/22/2021 ? ?PCP: Seward Carol, MD ?REFERRING PROVIDER: Seward Carol, MD ? ? PT End of Session - 12/22/21 1609   ? ? Visit Number 3   ? Number of Visits 12   ? Date for PT Re-Evaluation 01/27/22   ? Authorization Type BCBS   ? Progress Note Due on Visit 10   ? PT Start Time 1610   late to apt  ? PT Stop Time 1640   ? PT Time Calculation (min) 30 min   ? ?  ?  ? ?  ? ? ?Past Medical History:  ?Diagnosis Date  ? Abnormal Pap smear 1993  ? Anemia   ? Atypical chest pain 11/01/2019  ? Breast mass, right   ? Fibroid   ? Hirsutism   ? Hypertension   ? Menorrhagia   ? Vitamin D deficiency   ? ?Past Surgical History:  ?Procedure Laterality Date  ? ABDOMINOPLASTY    ? CESAREAN SECTION    ? Diastasis Repair  2010  ? HYSTEROSCOPY WITH D & C    ? LIPOSUCTION    ? TUBAL LIGATION    ? ?Patient Active Problem List  ? Diagnosis Date Noted  ? Atypical chest pain 11/01/2019  ? Amenorrhea 02/07/2019  ? Candidal vulvovaginitis 02/07/2019  ? Hirsutism 02/07/2019  ? History of anemia 02/07/2019  ? Irregular periods 02/07/2019  ? Leukorrhea 02/07/2019  ? Menopausal symptom 02/07/2019  ? Pica 02/07/2019  ? Uterine leiomyoma 02/07/2019  ? Vaginal dryness 02/07/2019  ? Other allergic rhinitis 12/26/2018  ? Allergic reaction 12/26/2018  ? Rash and other nonspecific skin eruption 12/26/2018  ? Vitamin D deficiency 09/19/2017  ? Prediabetes 09/19/2017  ? Elevated LFTs 09/19/2017  ? Essential hypertension 09/19/2017  ? Class 1 obesity with serious comorbidity and body mass index (BMI) of 33.0 to 33.9 in adult 09/19/2017  ? Breast lump 09/18/2011  ? ? ? ?PCP: Seward Carol, MD ? ?REFERRING PROVIDER: Gregor Hams, MD ? ?REFERRING DIAG: M54.41,G89.29 (ICD-10-CM) - Chronic right-sided low back pain with right-sided sciatica ? ?THERAPY DIAG:  ?Muscle weakness (generalized) ? ?Other low back  pain ? ?ONSET DATE: 1 year ago ? ?SUBJECTIVE:                                                                                                                                                                                          ? ?SUBJECTIVE STATEMENT: ?12/22/2021 ?States that she has not tried a mattress topper as she has been busy and is tired.  ? ?Eval: States  that she had back pain for about a year and it was kind of faint. States that one time she was running on the treadmill and her right leg felt weak so she backed down. States that she thought it was the mattress but they haven't changed it yet. States she did participate in orange theory but she put it on hold because of her pain. States she runs and loves to run but hasn't run for 3 weeks but likes to run 1-2 miles. Overall she is frustrated as she wants to stay active but can't because of her pain. ? ?PERTINENT HISTORY:  ?C-section ? ?PAIN:  ?Are you having pain? no: NPRS scale: 0/10 ?Pain location: right low back and in the right knee (top of knee) ?Pain description: catch in her back  ?Aggravating factors: transitioning from STS, yoga pose with right leg extended (quad stretch position- dancer pose), in the morning ?Relieving factors: movement ? ? ?PRECAUTIONS: None ? ?WEIGHT BEARING RESTRICTIONS No ? ?FALLS:  ?Has patient fallen in last 6 months? No ? ?OCCUPATION: PE teacher ? ?PLOF: Independent ? ?PATIENT GOALS to take the pain to go away. ? ? ?OBJECTIVE:  ? ?DIAGNOSTIC FINDINGS:  ?Xray 11/25/21 ?IMPRESSION: ?Mild multilevel degenerative changes ? ? ?COGNITION: ? Overall cognitive status: Within functional limits for tasks assessed   ?  ?MUSCLE LENGTH: ?Hamstrings: Right 30 deg; Left 30 deg ? ?POSTURE:  ?hitches at L4/5 ? ?PALPATION: ?Tenderness to palpation along right lumbar paraspinals and glutes, hypomobility throughout lumbar spine ? ?LUMBAR ROM:  ? ?Active  A/PROM  ?12/02/2021  ?Flexion 25% limited (painful along left low back)  ?Extension  Hinges at L4/5 50% limited  ?Right lateral flexion Pinching on right 50% limited  ?Left lateral flexion Stretching on right 50% limited  ?Right rotation   ?Left rotation   ? (Blank rows = not tested) ? ? ? ? LE Measurements ?Lower Extremity Right ?12/02/2021 Left ?12/02/2021  ? A/PROM MMT A/PROM MMT  ?Hip Flexion WNL 4  4+  ?Hip Extension '10 3 10 3  '$ ?Hip Abduction      ?Hip Adduction      ?Hip Internal rotation WNL*  WNL^   ?Hip External rotation      ?Knee Flexion WNL 3+ WNL 4+  ?Knee Extension WNL 4 WNL 4+  ?Ankle Dorsiflexion  5  5  ?Ankle Plantarflexion      ?Ankle Inversion      ?Ankle Eversion      ? (Blank rows = not tested) ? * pain ?^ guarded motion ? ? ?LUMBAR SPECIAL TESTS:  ?Straight leg raise test: neg, Slump test: Positive with left extended pain on right , and FABER test: negative, ely's test + on right ? ? ? ? ?TODAY'S TREATMENT  ?12/22/2021 ?Therapeutic Exercise: ?Supine:  ?LTR 2 minutes on ball,  ?hamstring iso on ball 5" holds 2 minutes,  ?[place] on ball 2 minutes,  ?bridges 2x12 2" holds with blue band ?Standing: ?Lateral steps with green band 3 lasp of 40 feet ?[place] red band 3x10 bilateral ?Shoulder extension RTB 3x30"  bilateral ? ?Quadruped: Cat cow 2 minutes total  ? : ?Neuromuscular Re-education: ?Manual Therapy:   ?Therapeutic Activity: ?Self Care: ?Trigger Point Dry Needling:  ?Modalities:  ? ? ? ?PATIENT EDUCATION:  ?Education details: on importance of HEP adherence  ?Person educated: Patient ?Education method: Explanation, Demonstration, and Handouts ?Education comprehension: verbalized understanding ? ? ?HOME EXERCISE PROGRAM: ?[id number] ? ?ASSESSMENT: ? ?CLINICAL IMPRESSION: ?12/22/2021 ?Session limited secondary to late arrival. Reviewed  HEP as patient has been busy and has not been able to do many of her exercises over the last couple weeks. Added additional hip strengthening exercises and provided patient with green theraband for HEP adherence. Will continue with current POC as  tolerated. ? ?Eval: Patient is a 51 y.o. female who was seen today for physical therapy evaluation and treatment for low back pain referring into right lower extremity. Patient presents with weakness bilaterally with right leg greater than left, ROM limitations and pain with functional movements. Educated patient on current presentation and POC moving forward. Patient would benefit from skilled PT to improve overall function and quality of life and to reduce risk of further injury. ? ? ?OBJECTIVE IMPAIRMENTS decreased activity tolerance, decreased ROM, decreased strength, improper body mechanics, postural dysfunction, and pain.  ? ?ACTIVITY LIMITATIONS yard work and working out, running.  ? ?PERSONAL FACTORS Fitness and Time since onset of injury/illness/exacerbation are also affecting patient's functional outcome.  ? ? ?REHAB POTENTIAL: Excellent ? ?CLINICAL DECISION MAKING: Stable/uncomplicated ? ?EVALUATION COMPLEXITY: Low ? ?GOALS: ?Goals reviewed with patient?  yes ? ?SHORT TERM GOALS: ? ?Patient will be independent in self management strategies to improve quality of life and functional outcomes. ?Baseline: new program ?Target date: 12/30/2021 ?Goal status: INITIAL ? ?2.  Patient will report at least 50% improvement in overall symptoms and/or function to demonstrate improved functional mobility ?Baseline: 0% ?Target date: 12/30/2021 ?Goal status: INITIAL ? ?3.  Patient will be able to perform painfree lumbar ROM  ?Baseline: painful ?Target date: 12/30/2021 ?Goal status: INITIAL ? ? ? ? ? ? ?LONG TERM GOALS: ? ?Patient will report at least 75% improvement in overall symptoms and/or function to demonstrate improved functional mobility ?Baseline: 0% ?Target date: 01/27/2022 ?Goal status: INITIAL ? ?2.  Patient will be able to wake up without pain to improve sleep quality ?Baseline: painful ?Target date: 01/27/2022 ?Goal status: INITIAL ? ?3.  Patient will demonstrate at least 4/5 MMT strength throughout lower legs to  demonstrate improved functional strength. ? ?Target date: 01/27/2022 ?Goal status: INITIAL ? ? ? ?PLAN: ?PT FREQUENCY: 1-2x/week for a total of 12 visits over 8 week certification ? ?PT DURATION: 8 weeks ? ?PLANNED INTERVENTIONS

## 2021-12-23 NOTE — Progress Notes (Signed)
? ? ? ?Chief Complaint:  ? ?OBESITY ?Caitlin Stark is here to discuss her progress with her obesity treatment plan along with follow-up of her obesity related diagnoses. Caitlin Stark is on keeping a food journal and adhering to recommended goals of 1200-1400 calories and 85+ grams of protein daily and states she is following her eating plan approximately 90% of the time. Caitlin Stark states she is walking /physical therapy for 40 minutes 1 time per week. ? ?Today's visit was #: 23 ?Starting weight: 215 lbs ?Starting date: 04/30/2020 ?Today's weight: 204 lbs ?Today's date: 12/16/2021 ?Total lbs lost to date: 11 ?Total lbs lost since last in-office visit: 4 ? ?Interim History: Caitlin Stark is doing well with weight loss and she is doing well with meeting her protein goals. She is doing well with meal planning, and her hunger is controlled. She is working on no skipping meals.  ? ?Subjective:  ? ?1. Pre-diabetes ?Caitlin Stark is on Ozempic and she is doing well with diet and weight loss. She denies nausea, vomiting, or hypoglycemia.  ? ?2. Vitamin D deficiency ?Caitlin Stark's last Vitamin D level was at goal. She is at high risk of over-replacement.  ? ?Assessment/Plan:  ? ?1. Pre-diabetes ?We will refill Ozempic for 1 month. Caitlin Stark will continue to work on weight loss, exercise, and decreasing simple carbohydrates to help decrease the risk of diabetes.  ? ?- Semaglutide, 2 MG/DOSE, 8 MG/3ML SOPN; Inject 2 mg as directed once a week.  Dispense: 3 mL; Refill: 0 ? ?2. Vitamin D deficiency ?Caitlin Stark agreed to hold Vitamin D for now. We will check labs today, and will follow up at her next office visit. ? ?- VITAMIN D 25 Hydroxy (Vit-D Deficiency, Fractures) ?- Vitamin D, Ergocalciferol, (DRISDOL) 1.25 MG (50000 UNIT) CAPS capsule; Take 1 capsule (50,000 Units total) by mouth every 7 (seven) days.  Dispense: 4 capsule; Refill: 0 ? ?3. Obesity with current BMI of 32.0 ?Caitlin Stark is currently in the action stage of change. As such, her goal is to continue with weight  loss efforts. She has agreed to keeping a food journal and adhering to recommended goals of 1400 calories and 80+ grams of protein daily.  ? ?Exercise goals: As is. ? ?Behavioral modification strategies: increasing lean protein intake. ? ?Caitlin Stark has agreed to follow-up with our clinic in 3 weeks. She was informed of the importance of frequent follow-up visits to maximize her success with intensive lifestyle modifications for her multiple health conditions.  ? ?Caitlin Stark was informed we would discuss her lab results at her next visit unless there is a critical issue that needs to be addressed sooner. Caitlin Stark agreed to keep her next visit at the agreed upon time to discuss these results. ? ?Objective:  ? ?Blood pressure 106/65, pulse 61, temperature 97.9 ?F (36.6 ?C), height '5\' 7"'$  (1.702 m), weight 204 lb (92.5 kg), last menstrual period 09/10/2012, SpO2 97 %. ?Body mass index is 31.95 kg/m?. ? ?General: Cooperative, alert, well developed, in no acute distress. ?HEENT: Conjunctivae and lids unremarkable. ?Cardiovascular: Regular rhythm.  ?Lungs: Normal work of breathing. ?Neurologic: No focal deficits.  ? ?Lab Results  ?Component Value Date  ? CREATININE 1.08 (H) 08/04/2021  ? BUN 14 08/04/2021  ? NA 143 08/04/2021  ? K 4.6 08/04/2021  ? CL 103 08/04/2021  ? CO2 24 08/04/2021  ? ?Lab Results  ?Component Value Date  ? ALT 13 08/04/2021  ? AST 19 08/04/2021  ? ALKPHOS 135 (H) 08/04/2021  ? BILITOT 0.3 08/04/2021  ? ?Lab Results  ?  Component Value Date  ? HGBA1C 5.8 (H) 08/04/2021  ? HGBA1C 5.6 10/14/2020  ? HGBA1C 5.8 (H) 04/30/2020  ? HGBA1C 5.5 05/21/2018  ? HGBA1C 5.7 (H) 01/02/2018  ? ?Lab Results  ?Component Value Date  ? INSULIN 11.2 08/04/2021  ? INSULIN 14.8 10/14/2020  ? INSULIN 12.3 04/30/2020  ? INSULIN 4.5 05/21/2018  ? INSULIN 10.1 01/02/2018  ? ?Lab Results  ?Component Value Date  ? TSH 2.030 04/30/2020  ? ?Lab Results  ?Component Value Date  ? CHOL 152 08/04/2021  ? HDL 41 08/04/2021  ? Kaplan 96 08/04/2021   ? TRIG 76 08/04/2021  ? CHOLHDL 3.7 08/04/2021  ? ?Lab Results  ?Component Value Date  ? VD25OH 51.2 12/16/2021  ? VD25OH 74.1 08/04/2021  ? VD25OH 48.7 10/14/2020  ? ?Lab Results  ?Component Value Date  ? WBC 8.0 04/30/2020  ? HGB 13.3 04/30/2020  ? HCT 41.2 04/30/2020  ? MCV 86 04/30/2020  ? PLT 343 04/30/2020  ? ?No results found for: IRON, TIBC, FERRITIN ? ?Attestation Statements:  ? ?Reviewed by clinician on day of visit: allergies, medications, problem list, medical history, surgical history, family history, social history, and previous encounter notes. ? ? ?I, Trixie Dredge, am acting as transcriptionist for Dennard Nip, MD. ? ?I have reviewed the above documentation for accuracy and completeness, and I agree with the above. -  Dennard Nip, MD ? ? ?

## 2021-12-29 ENCOUNTER — Encounter: Payer: Self-pay | Admitting: Physical Therapy

## 2021-12-29 ENCOUNTER — Ambulatory Visit: Payer: BC Managed Care – PPO | Admitting: Physical Therapy

## 2021-12-29 DIAGNOSIS — M5459 Other low back pain: Secondary | ICD-10-CM

## 2021-12-29 DIAGNOSIS — M6281 Muscle weakness (generalized): Secondary | ICD-10-CM

## 2021-12-29 NOTE — Therapy (Addendum)
OUTPATIENT PHYSICAL THERAPY TREATMENT NOTE and DISCHARGE NOTE  PHYSICAL THERAPY DISCHARGE SUMMARY  Visits from Start of Care: 4  Current functional level related to goals / functional outcomes: Unable to assess due to unplanned discharge    Remaining deficits: Unable to assess due to unplanned discharge    Education / Equipment: Unable to assess due to unplanned discharge    Patient agrees to discharge. Patient goals were not met. Patient is being discharged due to not returning since the last visit. 11:22 AM, 03/07/22 Jerene Pitch, DPT Physical Therapy with [facility]   Patient Name: Taler Kushner MRN: [id number] DOB:1971/04/04, 51 y.o., female Today's Date: 12/29/2021  PCP: Seward Carol, MD REFERRING PROVIDER: Seward Carol, MD   PT End of Session - 12/29/21 1604     Visit Number 4    Number of Visits 12    Date for PT Re-Evaluation 01/27/22    Authorization Type BC[employer]    Progress Note Due on Visit 10    PT Start Time 1604    PT Stop Time 1642    PT Time Calculation (min) 38 min             Past Medical History:  Diagnosis Date   Abnormal Pap smear 1993   Anemia    Atypical chest pain 11/01/2019   Breast mass, right    Fibroid    Hirsutism    Hypertension    Menorrhagia    Vitamin D deficiency    Past Surgical History:  Procedure Laterality Date   ABDOMINOPLASTY     CESAREAN SECTION     Diastasis Repair  2010   HYSTEROSCOPY WITH D & C     LIPOSUCTION     TUBAL LIGATION     Patient Active Problem List   Diagnosis Date Noted   Atypical chest pain 11/01/2019   Amenorrhea 02/07/2019   Candidal vulvovaginitis 02/07/2019   Hirsutism 02/07/2019   History of anemia 02/07/2019   Irregular periods 02/07/2019   Leukorrhea 02/07/2019   Menopausal symptom 02/07/2019   Pica 02/07/2019   Uterine leiomyoma 02/07/2019   Vaginal dryness 02/07/2019   Other allergic rhinitis 12/26/2018   Allergic reaction 12/26/2018   Rash and other nonspecific  skin eruption 12/26/2018   Vitamin D deficiency 09/19/2017   Prediabetes 09/19/2017   Elevated LFTs 09/19/2017   Essential hypertension 09/19/2017   Class 1 obesity with serious comorbidity and body mass index (BMI) of 33.0 to 33.9 in adult 09/19/2017   Breast lump 09/18/2011     PCP: Seward Carol, MD  REFERRING PROVIDER: Gregor Hams, MD  REFERRING DIAG: [phone] (ICD-10-CM) - Chronic right-sided low back pain with right-sided sciatica  THERAPY DIAG:  Muscle weakness (generalized)  Other low back pain  ONSET DATE: 1 year ago  SUBJECTIVE:  SUBJECTIVE STATEMENT: 12/29/2021 States that she didn't really have any pain this last week but didn't do her exercises like she should. States overall she feels about 60% better since the start of PT. States that she is waiting to see if her pain is going to come back  Eval: States that she had back pain for about a year and it was kind of faint. States that one time she was running on the treadmill and her right leg felt weak so she backed down. States that she thought it was the mattress but they haven't changed it yet. States she did participate in [employer] but she put it on hold because of her pain. States she runs and loves to run but hasn't run for 3 weeks but likes to run 1-2 miles. Overall she is frustrated as she wants to stay active but can't because of her pain.  PERTINENT HISTORY:  C-section  PAIN:  Are you having pain? no: NPRS scale: 0/10 Pain location: right low back and in the right knee (top of knee) Pain description: catch in her back  Aggravating factors: transitioning from STS, yoga pose with right leg extended (quad stretch position- dancer pose), in the morning Relieving factors: movement   PRECAUTIONS: None  WEIGHT  BEARING RESTRICTIONS No  FALLS:  Has patient fallen in last 6 months? No  OCCUPATION: PE teacher  PLOF: Independent  PATIENT GOALS to take the pain to go away.   OBJECTIVE:   DIAGNOSTIC FINDINGS:  Xray 11/25/21 IMPRESSION: Mild multilevel degenerative changes   COGNITION:  Overall cognitive status: Within functional limits for tasks assessed     MUSCLE LENGTH: Hamstrings: Right 30 deg; Left 30 deg  POSTURE:  hitches at L4/5  PALPATION: Tenderness to palpation along right lumbar paraspinals and glutes, hypomobility throughout lumbar spine  LUMBAR ROM:   Active  A/PROM  12/02/2021  Flexion 25% limited (painful along left low back)  Extension Hinges at L4/5 50% limited  Right lateral flexion Pinching on right 50% limited  Left lateral flexion Stretching on right 50% limited  Right rotation   Left rotation    (Blank rows = not tested)     LE Measurements Lower Extremity Right 12/02/2021 Left 12/02/2021   A/PROM MMT A/PROM MMT  Hip Flexion WNL 4  4+  Hip Extension _0 Hip Abduction      Hip Adduction      Hip Internal rotation WNL*  WNL^   Hip External rotation      Knee Flexion WNL 3+ WNL 4+  Knee Extension WNL 4 WNL 4+  Ankle Dorsiflexion  5  5  Ankle Plantarflexion      Ankle Inversion      Ankle Eversion       (Blank rows = not tested)  * pain ^ guarded motion   LUMBAR SPECIAL TESTS:  Straight leg raise test: neg, Slump test: Positive with left extended pain on right , and FABER test: negative, ely's test + on right     TODAY'S TREATMENT  12/29/2021 Therapeutic Exercise: Seated: lumbar flexion x10 5"holds, lumbar ROT x10 5" holds, lumbar side bending x10 5"holds , lumbar side bending and flexion combo x10 5" holds Supine:  Dead bugs x10 - did not like, bent knee leg lowers x10 slow and controlled Standing: Stir the pot red band 2x10 bilateral   Quadruped: plank - 3x5 5" holds  : Neuromuscular Re-education: Manual Therapy:    Therapeutic Activity: Self Care: Trigger Point  Dry Needling:  Modalities:     PATIENT EDUCATION:  Education details: on importance of core strength and how it relates to back pain and reducing back pain. Person educated: Patient Education method: Explanation, Demonstration, and Handouts Education comprehension: verbalized understanding   HOME EXERCISE PROGRAM: [id number]  ASSESSMENT:  CLINICAL IMPRESSION: 12/29/2021 Session focused seated exercises to improve ability to perform exercises throughout the day and therefore improve adherence to HEP. Educated patient on importance of core strength with back pain and anatomy related to this. Fatigue in core noted end of session. Educated patient on habitual postures with standing and sitting and trying to stand in more active posture throughout the day.  Eval: Patient is a 51 y.o. female who was seen today for physical therapy evaluation and treatment for low back pain referring into right lower extremity. Patient presents with weakness bilaterally with right leg greater than left, ROM limitations and pain with functional movements. Educated patient on current presentation and POC moving forward. Patient would benefit from skilled PT to improve overall function and quality of life and to reduce risk of further injury.   OBJECTIVE IMPAIRMENTS decreased activity tolerance, decreased ROM, decreased strength, improper body mechanics, postural dysfunction, and pain.   ACTIVITY LIMITATIONS yard work and working out, running.   PERSONAL FACTORS Fitness and Time since onset of injury/illness/exacerbation are also affecting patient's functional outcome.    REHAB POTENTIAL: Excellent  CLINICAL DECISION MAKING: Stable/uncomplicated  EVALUATION COMPLEXITY: Low  GOALS: Goals reviewed with patient?  yes  SHORT TERM GOALS:  Patient will be independent in self management strategies to improve quality of life and functional outcomes. Baseline:  new program Target date: 12/30/2021 Goal status: INITIAL  2.  Patient will report at least 50% improvement in overall symptoms and/or function to demonstrate improved functional mobility Baseline: 0% Target date: 12/30/2021 Goal status: INITIAL  3.  Patient will be able to perform painfree lumbar ROM  Baseline: painful Target date: 12/30/2021 Goal status: INITIAL       LONG TERM GOALS:  Patient will report at least 75% improvement in overall symptoms and/or function to demonstrate improved functional mobility Baseline: 0% Target date: 01/27/2022 Goal status: INITIAL  2.  Patient will be able to wake up without pain to improve sleep quality Baseline: painful Target date: 01/27/2022 Goal status: INITIAL  3.  Patient will demonstrate at least 4/5 MMT strength throughout lower legs to demonstrate improved functional strength.  Target date: 01/27/2022 Goal status: INITIAL    PLAN: PT FREQUENCY: 1-2x/week for a total of 12 visits over 8 week certification  PT DURATION: 8 weeks  PLANNED INTERVENTIONS: Therapeutic exercises, Therapeutic activity, Neuromuscular re-education, Balance training, Gait training, Patient/Family education, Joint mobilization, Vestibular training, Aquatic Therapy, Dry Needling, Electrical stimulation, Spinal manipulation, Cryotherapy, and Manual therapy.  PLAN FOR NEXT SESSION: quad stretch, hamstring/glute strengthening. STM and stretching to lumbar and glutes,f/u with core strength and habitual postures.  4:46 PM, 12/29/21 Jerene Pitch, DPT Physical Therapy with [facility]  [phone] office

## 2022-01-03 ENCOUNTER — Ambulatory Visit (INDEPENDENT_AMBULATORY_CARE_PROVIDER_SITE_OTHER): Payer: BC Managed Care – PPO | Admitting: Family Medicine

## 2022-01-03 ENCOUNTER — Encounter (INDEPENDENT_AMBULATORY_CARE_PROVIDER_SITE_OTHER): Payer: Self-pay | Admitting: Family Medicine

## 2022-01-03 VITALS — BP 112/74 | HR 72 | Temp 97.9°F | Ht 67.0 in | Wt 203.0 lb

## 2022-01-03 DIAGNOSIS — R7303 Prediabetes: Secondary | ICD-10-CM

## 2022-01-03 DIAGNOSIS — E669 Obesity, unspecified: Secondary | ICD-10-CM

## 2022-01-03 DIAGNOSIS — Z6831 Body mass index (BMI) 31.0-31.9, adult: Secondary | ICD-10-CM | POA: Diagnosis not present

## 2022-01-03 DIAGNOSIS — I10 Essential (primary) hypertension: Secondary | ICD-10-CM

## 2022-01-12 ENCOUNTER — Encounter: Payer: BC Managed Care – PPO | Admitting: Physical Therapy

## 2022-01-18 NOTE — Progress Notes (Signed)
Chief Complaint:   OBESITY Caitlin Stark is here to discuss her progress with her obesity treatment plan along with follow-up of her obesity related diagnoses. Caitlin Stark is on keeping a food journal and adhering to recommended goals of 1400 calories and 80+ grams of protein daily and states she is following her eating plan approximately 80% of the time. Caitlin Stark states she is doing 0 minutes 0 times per week.  Today's visit was #: 24 Starting weight: 215 lbs Starting date: 04/30/2020 Today's weight: 203 lbs Today's date: 01/03/2022 Total lbs lost to date: 12 Total lbs lost since last in-office visit: 1  Interim History: Caitlin Stark continues to work on diet and weight loss. She has struggled to get protein in. She has increased stress at work and at home.   Subjective:   1. Essential hypertension Caitlin Stark's blood pressure is at goal. She is doing well on her medications and with her diet, exercise, and weight loss. She has no signs of hypotension.    2. Pre-diabetes Caitlin Stark has been on [place] in the past.   Assessment/Plan:   1. Essential hypertension Caitlin Stark will continues with diet and exercise. The goal is to try to decrease medications as she continues to work on weight loss efforts.   2. Pre-diabetes Caitlin Stark will continue Ozempic and continue exercise, and decreasing simple carbohydrates to help decrease the risk of diabetes. Will continue to follow.   3. Obesity, Current BMI 31.9 Caitlin Stark is currently in the action stage of change. As such, her goal is to continue with weight loss efforts. She has agreed to keeping a food journal and adhering to recommended goals of 1400 calories and 80+ grams of protein daily.   Protein recipe ideas were given. We will recheck fasting labs at her next visit.  Behavioral modification strategies: increasing lean protein intake.  Caitlin Stark has agreed to follow-up with our clinic in 4 weeks. She was informed of the importance of frequent follow-up  visits to maximize her success with intensive lifestyle modifications for her multiple health conditions.   Objective:   Blood pressure 112/74, pulse 72, temperature 97.9 F (36.6 C), height '5\' 7"'$  (1.702 m), weight 203 lb (92.1 kg), last menstrual period 09/10/2012, SpO2 96 %. Body mass index is 31.79 kg/m.  General: Cooperative, alert, well developed, in no acute distress. HEENT: Conjunctivae and lids unremarkable. Cardiovascular: Regular rhythm.  Lungs: Normal work of breathing. Neurologic: No focal deficits.   Lab Results  Component Value Date   CREATININE 1.08 (H) 08/04/2021   BUN 14 08/04/2021   NA 143 08/04/2021   K 4.6 08/04/2021   CL 103 08/04/2021   CO2 24 08/04/2021   Lab Results  Component Value Date   ALT 13 08/04/2021   AST 19 08/04/2021   ALKPHOS 135 (H) 08/04/2021   BILITOT 0.3 08/04/2021   Lab Results  Component Value Date   HGBA1C 5.8 (H) 08/04/2021   HGBA1C 5.6 10/14/2020   HGBA1C 5.8 (H) 04/30/2020   HGBA1C 5.5 05/21/2018   HGBA1C 5.7 (H) 01/02/2018   Lab Results  Component Value Date   INSULIN 11.2 08/04/2021   INSULIN 14.8 10/14/2020   INSULIN 12.3 04/30/2020   INSULIN 4.5 05/21/2018   INSULIN 10.1 01/02/2018   Lab Results  Component Value Date   TSH 2.030 04/30/2020   Lab Results  Component Value Date   CHOL 152 08/04/2021   HDL 41 08/04/2021   LDLCALC 96 08/04/2021   TRIG 76 08/04/2021   CHOLHDL 3.7  08/04/2021   Lab Results  Component Value Date   VD25OH 51.2 12/16/2021   VD25OH 74.1 08/04/2021   VD25OH 48.7 10/14/2020   Lab Results  Component Value Date   WBC 8.0 04/30/2020   HGB 13.3 04/30/2020   HCT 41.2 04/30/2020   MCV 86 04/30/2020   PLT 343 04/30/2020   No results found for: IRON, TIBC, FERRITIN  Attestation Statements:   Reviewed by clinician on day of visit: allergies, medications, problem list, medical history, surgical history, family history, social history, and previous encounter notes.   I, Trixie Dredge, am acting as transcriptionist for Dennard Nip, MD.  I have reviewed the above documentation for accuracy and completeness, and I agree with the above. -  Dennard Nip, MD

## 2022-01-19 ENCOUNTER — Encounter: Payer: BC Managed Care – PPO | Admitting: Physical Therapy

## 2022-01-20 ENCOUNTER — Ambulatory Visit: Payer: BC Managed Care – PPO | Admitting: Family Medicine

## 2022-01-20 NOTE — Progress Notes (Deleted)
    Shalonda Valladares is a 51 y.o. female who presents to [place] at [facility] today for f/u chronic R-sided low back pain w/ radiating pain into the R thigh. Pt was last seen by Dr. Georgina Snell on 11/25/21 and was referred to PT, completing 4 visits. Today, pt reports  Dx imaging: 11/25/21 L-spine XR  Pertinent review of systems: ***  Relevant historical information: ***   Exam:  LMP 09/10/2012  General: Well Developed, well nourished, and in no acute distress.   MSK: ***    Lab and Radiology Results No results found for this or any previous visit (from the past 72 hour(s)). No results found.     Assessment and Plan: 51 y.o. female with ***   PDMP not reviewed this encounter. No orders of the defined types were placed in this encounter.  No orders of the defined types were placed in this encounter.    Discussed warning signs or symptoms. Please see discharge instructions. Patient expresses understanding.   ***

## 2022-01-24 ENCOUNTER — Ambulatory Visit (INDEPENDENT_AMBULATORY_CARE_PROVIDER_SITE_OTHER): Payer: BC Managed Care – PPO | Admitting: Family Medicine

## 2022-02-01 ENCOUNTER — Encounter (INDEPENDENT_AMBULATORY_CARE_PROVIDER_SITE_OTHER): Payer: Self-pay | Admitting: Family Medicine

## 2022-02-03 ENCOUNTER — Ambulatory Visit (INDEPENDENT_AMBULATORY_CARE_PROVIDER_SITE_OTHER): Payer: BC Managed Care – PPO | Admitting: Family Medicine

## 2022-02-14 NOTE — Progress Notes (Signed)
   I, Wendy Poet, LAT, ATC, am serving as scribe for Dr. Lynne Leader.  Caitlin Stark is a 51 y.o. female who presents to [place] at [facility] today for f/u of R-sided LBP w/ radiating pain into her R buttocks and thigh.  She was last seen by Dr. Georgina Snell on 11/25/21 and was referred to PT of which she completed 4 visits.  Today, pt reports slight improvement in LBP. Pt has had to stop PT to high co-pay, but has been working on HEP when she has time. Radiating pain has continued. Pt added a memory foam topper to her mattress.  Diagnostic testing: L-spine XR- 11/25/21  Pertinent review of systems: No fevers or chills  Relevant historical information: Hypertension   Exam:  BP 114/78   Pulse 70   Ht '5\' 7"'$  (1.702 m)   Wt 203 lb 9.6 oz (92.4 kg)   LMP 09/10/2012   SpO2 97%   BMI 31.89 kg/m  General: Well Developed, well nourished, and in no acute distress.   MSK: L-spine: Nontender midline.  Normal lumbar motion. Lower extremity strength is intact. Reflexes are intact.    Lab and Radiology Results No results found for this or any previous visit (from the past 72 hour(s)). No results found.     Assessment and Plan: 51 y.o. female with lumbar radiculopathy and back pain.  Significantly improved with PT.  She still has some residual symptoms thought to be probably right L3 lumbar radiculopathy.  Plan for watchful waiting at this time after discussion.  Next step would be MRI for potential epidural steroid injection planning.  She does not think her symptoms are bad enough at this time for that next step.  She notes that meloxicam has been helpful very intermittently in the past.  I have refilled this medication.  Cautioned her against taking it regularly.  Recheck back with me as needed.  Total encounter time 30 minutes including face-to-face time with the patient and, reviewing past medical record, and charting on the date of service.   Review of current  situation and discussion of treatment plan and options and medication safety discussion.  PDMP not reviewed this encounter. No orders of the defined types were placed in this encounter.  Meds ordered this encounter  Medications   meloxicam (MOBIC) 15 MG tablet    Sig: Take 1 tablet (15 mg total) by mouth daily as needed for pain.    Dispense:  30 tablet    Refill:  1     Discussed warning signs or symptoms. Please see discharge instructions. Patient expresses understanding.   The above documentation has been reviewed and is accurate and complete Lynne Leader, M.D.

## 2022-02-15 ENCOUNTER — Ambulatory Visit: Payer: BC Managed Care – PPO | Admitting: Family Medicine

## 2022-02-15 VITALS — BP 114/78 | HR 70 | Ht 67.0 in | Wt 203.6 lb

## 2022-02-15 DIAGNOSIS — G8929 Other chronic pain: Secondary | ICD-10-CM | POA: Diagnosis not present

## 2022-02-15 DIAGNOSIS — M5441 Lumbago with sciatica, right side: Secondary | ICD-10-CM | POA: Diagnosis not present

## 2022-02-15 MED ORDER — MELOXICAM 15 MG PO TABS: 15.0000 mg | ORAL_TABLET | Freq: Every day | ORAL | 1 refills | Status: DC | PRN

## 2022-02-15 NOTE — Patient Instructions (Addendum)
Thank you for coming in today.   I've sent a prescription for Meloxicam to your pharmacy.   Continue working on your exercises at home  Check back as needed

## 2022-02-16 ENCOUNTER — Ambulatory Visit (INDEPENDENT_AMBULATORY_CARE_PROVIDER_SITE_OTHER): Payer: BC Managed Care – PPO | Admitting: Family Medicine

## 2022-02-16 ENCOUNTER — Encounter (INDEPENDENT_AMBULATORY_CARE_PROVIDER_SITE_OTHER): Payer: Self-pay | Admitting: Family Medicine

## 2022-02-16 VITALS — BP 110/66 | HR 62 | Temp 98.2°F | Ht 67.0 in | Wt 201.0 lb

## 2022-02-16 DIAGNOSIS — R632 Polyphagia: Secondary | ICD-10-CM

## 2022-02-16 DIAGNOSIS — Z6831 Body mass index (BMI) 31.0-31.9, adult: Secondary | ICD-10-CM | POA: Diagnosis not present

## 2022-02-16 DIAGNOSIS — E559 Vitamin D deficiency, unspecified: Secondary | ICD-10-CM

## 2022-02-16 DIAGNOSIS — E669 Obesity, unspecified: Secondary | ICD-10-CM | POA: Diagnosis not present

## 2022-02-16 DIAGNOSIS — Z6830 Body mass index (BMI) 30.0-30.9, adult: Secondary | ICD-10-CM

## 2022-02-17 MED ORDER — SEMAGLUTIDE (2 MG/DOSE) 8 MG/3ML ~~LOC~~ SOPN: 2.0000 mg | PEN_INJECTOR | SUBCUTANEOUS | 0 refills | Status: DC

## 2022-02-21 NOTE — Progress Notes (Signed)
Chief Complaint:   OBESITY Caitlin Stark is here to discuss her progress with her obesity treatment plan along with follow-up of her obesity related diagnoses. Caitlin Stark is on keeping a food journal and adhering to recommended goals of 1400 calories and 80+ grams of protein daily and states she is following her eating plan approximately 85% of the time. Caitlin Stark states she is walking throughout the day 7 times per week.    Today's visit was #: 25 Starting weight: 215 lbs Starting date: 04/30/2020 Today's weight: 201 lbs Today's date: 02/16/2022 Total lbs lost to date: 14 Total lbs lost since last in-office visit: 2  Interim History: Caitlin Stark continues to do well with weight loss.  She notes hunger has improved and she is trying to increase her protein.  She is working on not getting into a food rut.  Subjective:   1. Polyphagia Caitlin Stark is on Ozempic 0.5 mg 3 times per week.  She is on this dosing schedule as it helps keep the medicine in her system more evenly.  2. Vitamin D deficiency Caitlin Stark on vitamin D prescription, no side effects were noted.  Assessment/Plan:   1. Polyphagia Caitlin Stark will continue Ozempic 0.5 mg, and we will refill for 1 month.  - Semaglutide, 2 MG/DOSE, 8 MG/3ML SOPN; Inject 2 mg as directed once a week.  Dispense: 3 mL; Refill: 0  2. Vitamin D deficiency Caitlin Stark will continue vitamin D prescription, and we will recheck labs at her next visit.  3. Obesity, Current BMI 31.5 Caitlin Stark is currently in the action stage of change. As such, her goal is to continue with weight loss efforts. She has agreed to keeping a food journal and adhering to recommended goals of 1400 calories and 80+ grams of protein daily.   We will recheck fasting labs at her next visit.  Exercise goals: As is.  Behavioral modification strategies: meal planning and cooking strategies.  Caitlin Stark has agreed to follow-up with our clinic in 4 weeks. She was informed of the importance of frequent follow-up  visits to maximize her success with intensive lifestyle modifications for her multiple health conditions.   Objective:   Blood pressure 110/66, pulse 62, temperature 98.2 F (36.8 C), height '5\' 7"'$  (1.702 m), weight 201 lb (91.2 kg), last menstrual period 09/10/2012, SpO2 98 %. Body mass index is 31.48 kg/m.  General: Cooperative, alert, well developed, in no acute distress. HEENT: Conjunctivae and lids unremarkable. Cardiovascular: Regular rhythm.  Lungs: Normal work of breathing. Neurologic: No focal deficits.   Lab Results  Component Value Date   CREATININE 1.08 (H) 08/04/2021   BUN 14 08/04/2021   NA 143 08/04/2021   K 4.6 08/04/2021   CL 103 08/04/2021   CO2 24 08/04/2021   Lab Results  Component Value Date   ALT 13 08/04/2021   AST 19 08/04/2021   ALKPHOS 135 (H) 08/04/2021   BILITOT 0.3 08/04/2021   Lab Results  Component Value Date   HGBA1C 5.8 (H) 08/04/2021   HGBA1C 5.6 10/14/2020   HGBA1C 5.8 (H) 04/30/2020   HGBA1C 5.5 05/21/2018   HGBA1C 5.7 (H) 01/02/2018   Lab Results  Component Value Date   INSULIN 11.2 08/04/2021   INSULIN 14.8 10/14/2020   INSULIN 12.3 04/30/2020   INSULIN 4.5 05/21/2018   INSULIN 10.1 01/02/2018   Lab Results  Component Value Date   TSH 2.030 04/30/2020   Lab Results  Component Value Date   CHOL 152 08/04/2021   HDL 41 08/04/2021  LDLCALC 96 08/04/2021   TRIG 76 08/04/2021   CHOLHDL 3.7 08/04/2021   Lab Results  Component Value Date   VD25OH 51.2 12/16/2021   VD25OH 74.1 08/04/2021   VD25OH 48.7 10/14/2020   Lab Results  Component Value Date   WBC 8.0 04/30/2020   HGB 13.3 04/30/2020   HCT 41.2 04/30/2020   MCV 86 04/30/2020   PLT 343 04/30/2020   No results found for: "IRON", "TIBC", "FERRITIN"  Attestation Statements:   Reviewed by clinician on day of visit: allergies, medications, problem list, medical history, surgical history, family history, social history, and previous encounter notes.   I,  Trixie Dredge, am acting as transcriptionist for Dennard Nip, MD.  I have reviewed the above documentation for accuracy and completeness, and I agree with the above. -  Dennard Nip, MD

## 2022-03-23 ENCOUNTER — Ambulatory Visit (INDEPENDENT_AMBULATORY_CARE_PROVIDER_SITE_OTHER): Payer: BC Managed Care – PPO | Admitting: Family Medicine

## 2022-03-23 ENCOUNTER — Encounter (INDEPENDENT_AMBULATORY_CARE_PROVIDER_SITE_OTHER): Payer: Self-pay | Admitting: Family Medicine

## 2022-03-23 VITALS — BP 110/71 | HR 57 | Temp 98.0°F | Ht 67.0 in | Wt 198.0 lb

## 2022-03-23 DIAGNOSIS — R7303 Prediabetes: Secondary | ICD-10-CM | POA: Diagnosis not present

## 2022-03-23 DIAGNOSIS — E559 Vitamin D deficiency, unspecified: Secondary | ICD-10-CM | POA: Diagnosis not present

## 2022-03-23 DIAGNOSIS — Z6831 Body mass index (BMI) 31.0-31.9, adult: Secondary | ICD-10-CM

## 2022-03-23 DIAGNOSIS — E669 Obesity, unspecified: Secondary | ICD-10-CM

## 2022-03-23 MED ORDER — SEMAGLUTIDE (2 MG/DOSE) 8 MG/3ML ~~LOC~~ SOPN: 2.0000 mg | PEN_INJECTOR | SUBCUTANEOUS | 0 refills | Status: DC

## 2022-03-23 MED ORDER — VITAMIN D (ERGOCALCIFEROL) 1.25 MG (50000 UNIT) PO CAPS: 50000.0000 [IU] | ORAL_CAPSULE | ORAL | 0 refills | Status: DC

## 2022-03-24 LAB — CMP14+EGFR
ALT: 14 IU/L (ref 0–32)
AST: 18 IU/L (ref 0–40)
Albumin/Globulin Ratio: 1.4 (ref 1.2–2.2)
Albumin: 4.4 g/dL (ref 3.8–4.9)
Alkaline Phosphatase: 129 IU/L — ABNORMAL HIGH (ref 44–121)
BUN/Creatinine Ratio: 11 (ref 9–23)
BUN: 12 mg/dL (ref 6–24)
Bilirubin Total: 0.4 mg/dL (ref 0.0–1.2)
CO2: 23 mmol/L (ref 20–29)
Calcium: 9.9 mg/dL (ref 8.7–10.2)
Chloride: 104 mmol/L (ref 96–106)
Creatinine, Ser: 1.08 mg/dL — ABNORMAL HIGH (ref 0.57–1.00)
Globulin, Total: 3.1 g/dL (ref 1.5–4.5)
Glucose: 82 mg/dL (ref 70–99)
Potassium: 4.5 mmol/L (ref 3.5–5.2)
Sodium: 141 mmol/L (ref 134–144)
Total Protein: 7.5 g/dL (ref 6.0–8.5)
eGFR: 62 mL/min/{1.73_m2} (ref >59)

## 2022-03-24 LAB — VITAMIN D 25 HYDROXY (VIT D DEFICIENCY, FRACTURES): Vit D, 25-Hydroxy: 44.1 ng/mL (ref 30.0–100.0)

## 2022-03-24 LAB — LIPID PANEL WITH LDL/HDL RATIO
Cholesterol, Total: 147 mg/dL (ref 100–199)
HDL: 48 mg/dL (ref >39)
LDL Chol Calc (NIH): 85 mg/dL (ref 0–99)
LDL/HDL Ratio: 1.8 ratio (ref 0.0–3.2)
Triglycerides: 72 mg/dL (ref 0–149)
VLDL Cholesterol Cal: 14 mg/dL (ref 5–40)

## 2022-03-24 LAB — TSH: TSH: 2.09 u[IU]/mL (ref 0.450–4.500)

## 2022-03-24 LAB — INSULIN, RANDOM: INSULIN: 9.8 u[IU]/mL (ref 2.6–24.9)

## 2022-03-24 LAB — HEMOGLOBIN A1C
Est. average glucose Bld gHb Est-mCnc: 108 mg/dL
Hgb A1c MFr Bld: 5.4 % (ref 4.8–5.6)

## 2022-03-29 NOTE — Progress Notes (Signed)
Chief Complaint:   OBESITY Caitlin Stark is here to discuss her progress with her obesity treatment plan along with follow-up of her obesity related diagnoses. Caitlin Stark is on keeping a food journal and adhering to recommended goals of 1400 calories and 80+ grams of protein daily and states she is following her eating plan approximately 90% of the time. Caitlin Stark states she is walking 5 times per week.    Today's visit was #: 7 Starting weight: 215 lbs Starting date: 04/30/2020 Today's weight: 198 lbs Today's date: 03/23/2022 Total lbs lost to date: 17 Total lbs lost since last in-office visit: 3  Interim History: Caitlin Stark continues to do well with weight loss.  She has changed her exercise around and she is doing well with meal planning.  Subjective:   1. Vitamin D deficiency Caitlin Stark is on vitamin D, and she is due for labs.  No side effects were noted.  2. Prediabetes Caitlin Stark is stable on Ozempic, and she denies nausea, vomiting, or hypoglycemia.  Assessment/Plan:   1. Vitamin D deficiency We will check labs today, and we will refill prescription Vitamin D for 1 month. Caitlin Stark will follow-up for routine testing of Vitamin D, at least 2-3 times per year to avoid over-replacement.  - VITAMIN D 25 Hydroxy (Vit-D Deficiency, Fractures) - Vitamin D, Ergocalciferol, (DRISDOL) 1.25 MG (50000 UNIT) CAPS capsule; Take 1 capsule (50,000 Units total) by mouth every 7 (seven) days.  Dispense: 4 capsule; Refill: 0  2. Prediabetes We will check labs today, and we will refill Ozempic for 1 month.  Caitlin Stark will continue to work on weight loss, exercise, and decreasing simple carbohydrates to help decrease the risk of diabetes.   - CMP14+EGFR - Insulin, random - Hemoglobin A1c - Lipid Panel With LDL/HDL Ratio - TSH - Semaglutide, 2 MG/DOSE, 8 MG/3ML SOPN; Inject 2 mg as directed once a week.  Dispense: 3 mL; Refill: 0  3. Obesity, Current BMI 31.1 Caitlin Stark is currently in the action stage of change. As  such, her goal is to continue with weight loss efforts. She has agreed to keeping a food journal and adhering to recommended goals of 1400 calories and 80+ grams of protein daily.   Exercise goals: Add ankle weights to walking.  Behavioral modification strategies: increasing lean protein intake and travel eating strategies.  Caitlin Stark has agreed to follow-up with our clinic in 4 weeks. She was informed of the importance of frequent follow-up visits to maximize her success with intensive lifestyle modifications for her multiple health conditions.   Caitlin Stark was informed we would discuss her lab results at her next visit unless there is a critical issue that needs to be addressed sooner. Caitlin Stark agreed to keep her next visit at the agreed upon time to discuss these results.  Objective:   Blood pressure 110/71, pulse (!) 57, temperature 98 F (36.7 C), height $RemoveBe'5\' 7"'scSiNFmEQ$  (1.702 m), weight 198 lb (89.8 kg), last menstrual period 09/10/2012, SpO2 97 %. Body mass index is 31.01 kg/m.  General: Cooperative, alert, well developed, in no acute distress. HEENT: Conjunctivae and lids unremarkable. Cardiovascular: Regular rhythm.  Lungs: Normal work of breathing. Neurologic: No focal deficits.   Lab Results  Component Value Date   CREATININE 1.08 (H) 03/23/2022   BUN 12 03/23/2022   NA 141 03/23/2022   K 4.5 03/23/2022   CL 104 03/23/2022   CO2 23 03/23/2022   Lab Results  Component Value Date   ALT 14 03/23/2022   AST 18 03/23/2022  ALKPHOS 129 (H) 03/23/2022   BILITOT 0.4 03/23/2022   Lab Results  Component Value Date   HGBA1C 5.4 03/23/2022   HGBA1C 5.8 (H) 08/04/2021   HGBA1C 5.6 10/14/2020   HGBA1C 5.8 (H) 04/30/2020   HGBA1C 5.5 05/21/2018   Lab Results  Component Value Date   INSULIN 9.8 03/23/2022   INSULIN 11.2 08/04/2021   INSULIN 14.8 10/14/2020   INSULIN 12.3 04/30/2020   INSULIN 4.5 05/21/2018   Lab Results  Component Value Date   TSH 2.090 03/23/2022   Lab Results   Component Value Date   CHOL 147 03/23/2022   HDL 48 03/23/2022   LDLCALC 85 03/23/2022   TRIG 72 03/23/2022   CHOLHDL 3.7 08/04/2021   Lab Results  Component Value Date   VD25OH 44.1 03/23/2022   VD25OH 51.2 12/16/2021   VD25OH 74.1 08/04/2021   Lab Results  Component Value Date   WBC 8.0 04/30/2020   HGB 13.3 04/30/2020   HCT 41.2 04/30/2020   MCV 86 04/30/2020   PLT 343 04/30/2020   No results found for: "IRON", "TIBC", "FERRITIN"  Attestation Statements:   Reviewed by clinician on day of visit: allergies, medications, problem list, medical history, surgical history, family history, social history, and previous encounter notes.   I, Trixie Dredge, am acting as transcriptionist for Dennard Nip, MD.  I have reviewed the above documentation for accuracy and completeness, and I agree with the above. -  Dennard Nip, MD

## 2022-04-03 ENCOUNTER — Other Ambulatory Visit (INDEPENDENT_AMBULATORY_CARE_PROVIDER_SITE_OTHER): Payer: Self-pay | Admitting: Family Medicine

## 2022-04-03 DIAGNOSIS — R7303 Prediabetes: Secondary | ICD-10-CM

## 2022-04-04 ENCOUNTER — Encounter (INDEPENDENT_AMBULATORY_CARE_PROVIDER_SITE_OTHER): Payer: Self-pay | Admitting: Family Medicine

## 2022-04-05 ENCOUNTER — Other Ambulatory Visit (INDEPENDENT_AMBULATORY_CARE_PROVIDER_SITE_OTHER): Payer: Self-pay | Admitting: Family Medicine

## 2022-04-05 DIAGNOSIS — R7303 Prediabetes: Secondary | ICD-10-CM

## 2022-04-06 ENCOUNTER — Other Ambulatory Visit (INDEPENDENT_AMBULATORY_CARE_PROVIDER_SITE_OTHER): Payer: Self-pay | Admitting: Family Medicine

## 2022-04-06 DIAGNOSIS — R7303 Prediabetes: Secondary | ICD-10-CM

## 2022-04-07 ENCOUNTER — Encounter (INDEPENDENT_AMBULATORY_CARE_PROVIDER_SITE_OTHER): Payer: Self-pay

## 2022-04-07 ENCOUNTER — Telehealth (INDEPENDENT_AMBULATORY_CARE_PROVIDER_SITE_OTHER): Payer: Self-pay | Admitting: Family Medicine

## 2022-04-07 NOTE — Telephone Encounter (Signed)
Dr. Leafy Ro - Prior authorization denied for Ozempic. Per insurance: Patient does not have type 2 diabetes. Patient sent denial message via mychart.

## 2022-04-13 ENCOUNTER — Encounter (INDEPENDENT_AMBULATORY_CARE_PROVIDER_SITE_OTHER): Payer: Self-pay

## 2022-04-14 ENCOUNTER — Ambulatory Visit (INDEPENDENT_AMBULATORY_CARE_PROVIDER_SITE_OTHER): Payer: BC Managed Care – PPO | Admitting: Family Medicine

## 2022-04-14 ENCOUNTER — Encounter (INDEPENDENT_AMBULATORY_CARE_PROVIDER_SITE_OTHER): Payer: Self-pay | Admitting: Family Medicine

## 2022-04-14 ENCOUNTER — Other Ambulatory Visit (HOSPITAL_COMMUNITY): Payer: Self-pay

## 2022-04-14 VITALS — BP 116/74 | HR 66 | Temp 98.3°F | Ht 67.0 in | Wt 202.0 lb

## 2022-04-14 DIAGNOSIS — I10 Essential (primary) hypertension: Secondary | ICD-10-CM

## 2022-04-14 DIAGNOSIS — Z6831 Body mass index (BMI) 31.0-31.9, adult: Secondary | ICD-10-CM | POA: Diagnosis not present

## 2022-04-14 DIAGNOSIS — E669 Obesity, unspecified: Secondary | ICD-10-CM

## 2022-04-14 DIAGNOSIS — R7303 Prediabetes: Secondary | ICD-10-CM

## 2022-04-14 MED ORDER — WEGOVY 1.7 MG/0.75ML ~~LOC~~ SOAJ
1.7000 mg | SUBCUTANEOUS | 0 refills | Status: DC
  Filled 2022-04-14: qty 3, 28d supply, fill #0

## 2022-04-15 ENCOUNTER — Other Ambulatory Visit (HOSPITAL_COMMUNITY): Payer: Self-pay

## 2022-04-18 ENCOUNTER — Encounter (INDEPENDENT_AMBULATORY_CARE_PROVIDER_SITE_OTHER): Payer: Self-pay | Admitting: Family Medicine

## 2022-04-18 ENCOUNTER — Other Ambulatory Visit (HOSPITAL_COMMUNITY): Payer: Self-pay

## 2022-04-19 ENCOUNTER — Telehealth (INDEPENDENT_AMBULATORY_CARE_PROVIDER_SITE_OTHER): Payer: Self-pay | Admitting: Family Medicine

## 2022-04-19 ENCOUNTER — Encounter (INDEPENDENT_AMBULATORY_CARE_PROVIDER_SITE_OTHER): Payer: Self-pay

## 2022-04-19 NOTE — Telephone Encounter (Signed)
Dr. Leafy Ro - Prior authorization approved for [facility]. Effective: 04/18/2022 - 11/17/2022. Patient sent approval message via mychart.

## 2022-04-20 ENCOUNTER — Ambulatory Visit (INDEPENDENT_AMBULATORY_CARE_PROVIDER_SITE_OTHER): Payer: BC Managed Care – PPO | Admitting: Family Medicine

## 2022-04-25 NOTE — Progress Notes (Unsigned)
Chief Complaint:   OBESITY Caitlin Stark is here to discuss her progress with her obesity treatment plan along with follow-up of her obesity related diagnoses. Caitlin Stark is on keeping a food journal and adhering to recommended goals of 1400 calories and 80+ grams of protein daily and states she is following her eating plan approximately 80% of the time. Caitlin Stark states she is walking for 30-40 minutes 4 times per week.  Today's visit was #: 44 Starting weight: 215 lbs Starting date: 04/30/2020 Today's weight: 202 lbs Today's date: 04/14/2022 Total lbs lost to date: 13 Total lbs lost since last in-office visit: 0  Interim History: Caitlin Stark has been on vacation and she did some celebration eating, but she was mindful.  She is off her GLP-1 due to insurance lack of coverage.  Subjective:   1. Prediabetes Caitlin Stark was unable to continue with Ozempic due to insurance.  She notes increased polyphagia now.  2. Essential hypertension Caitlin Stark's blood pressure is controlled on her medications, and she has no signs of hypotension.  Assessment/Plan:   1. Prediabetes Caitlin Stark will try another GLP-1, and we will follow-up. She will continue to work on weight loss, exercise, and decreasing simple carbohydrates to help decrease the risk of diabetes.   2. Essential hypertension Caitlin Stark will continue with her diet, weight loss, and exercise, and medications.  We will continue to monitor.  3. Obesity, Current BMI 31.7 Caitlin Stark is currently in the action stage of change. As such, her goal is to continue with weight loss efforts. She has agreed to keeping a food journal and adhering to recommended goals of 1400 calories and 80+ grams of protein daily.   We discussed various medication options to help Caitlin Stark with her weight loss efforts and we both agreed to start Wegovy 1.7 mg once weekly with no refills.  - Semaglutide-Weight Management (WEGOVY) 1.7 MG/0.75ML SOAJ; Inject 1.7 mg into the skin once a week.  Dispense:  3 mL; Refill: 0  Exercise goals: As is.   Behavioral modification strategies: increasing lean protein intake and meal planning and cooking strategies.  Caitlin Stark has agreed to follow-up with our clinic in 3 to 4 weeks. She was informed of the importance of frequent follow-up visits to maximize her success with intensive lifestyle modifications for her multiple health conditions.   Objective:   Blood pressure 116/74, pulse 66, temperature 98.3 F (36.8 C), height '5\' 7"'$  (1.702 m), weight 202 lb (91.6 kg), last menstrual period 09/10/2012, SpO2 98 %. Body mass index is 31.64 kg/m.  General: Cooperative, alert, well developed, in no acute distress. HEENT: Conjunctivae and lids unremarkable. Cardiovascular: Regular rhythm.  Lungs: Normal work of breathing. Neurologic: No focal deficits.   Lab Results  Component Value Date   CREATININE 1.08 (H) 03/23/2022   BUN 12 03/23/2022   NA 141 03/23/2022   K 4.5 03/23/2022   CL 104 03/23/2022   CO2 23 03/23/2022   Lab Results  Component Value Date   ALT 14 03/23/2022   AST 18 03/23/2022   ALKPHOS 129 (H) 03/23/2022   BILITOT 0.4 03/23/2022   Lab Results  Component Value Date   HGBA1C 5.4 03/23/2022   HGBA1C 5.8 (H) 08/04/2021   HGBA1C 5.6 10/14/2020   HGBA1C 5.8 (H) 04/30/2020   HGBA1C 5.5 05/21/2018   Lab Results  Component Value Date   INSULIN 9.8 03/23/2022   INSULIN 11.2 08/04/2021   INSULIN 14.8 10/14/2020   INSULIN 12.3 04/30/2020   INSULIN 4.5 05/21/2018   Lab  Results  Component Value Date   TSH 2.090 03/23/2022   Lab Results  Component Value Date   CHOL 147 03/23/2022   HDL 48 03/23/2022   LDLCALC 85 03/23/2022   TRIG 72 03/23/2022   CHOLHDL 3.7 08/04/2021   Lab Results  Component Value Date   VD25OH 44.1 03/23/2022   VD25OH 51.2 12/16/2021   VD25OH 74.1 08/04/2021   Lab Results  Component Value Date   WBC 8.0 04/30/2020   HGB 13.3 04/30/2020   HCT 41.2 04/30/2020   MCV 86 04/30/2020   PLT 343  04/30/2020   No results found for: "IRON", "TIBC", "FERRITIN"  Attestation Statements:   Reviewed by clinician on day of visit: allergies, medications, problem list, medical history, surgical history, family history, social history, and previous encounter notes.   I, Trixie Dredge, am acting as transcriptionist for Dennard Nip, MD.  I have reviewed the above documentation for accuracy and completeness, and I agree with the above. -  Dennard Nip, MD

## 2022-05-04 ENCOUNTER — Ambulatory Visit: Payer: BC Managed Care – PPO | Admitting: Family Medicine

## 2022-05-04 ENCOUNTER — Ambulatory Visit: Payer: Self-pay

## 2022-05-04 VITALS — BP 128/84 | HR 72 | Ht 67.0 in | Wt 206.2 lb

## 2022-05-04 DIAGNOSIS — G8929 Other chronic pain: Secondary | ICD-10-CM

## 2022-05-04 DIAGNOSIS — M5441 Lumbago with sciatica, right side: Secondary | ICD-10-CM | POA: Diagnosis not present

## 2022-05-04 NOTE — Progress Notes (Unsigned)
I, Peterson Lombard, LAT, ATC acting as a scribe for Lynne Leader, MD.  Caitlin Stark is a 51 y.o. female who presents to [place] [facility] today for R-sided low back pain. Pt works as an Armed forces technical officer. Pt was last seen by Dr. Georgina Snell on 02/15/22 for chronic R-sided LBP w/ radiating pain into her R buttocks and thigh and was referred to PT. Pt was only able to complete 4 PT visits, due to high co-pay cost. Today, pt c/o R hip pain x /. Pt locates pain to R side of her low back and buttock and radiating w/ an aching pain into her R thigh. No numbness/tingling.   Aggravates: transitioning from sitting to standing  Dx imaging: 11/25/21 L-spine XR  Pertinent review of systems: No fevers or chills  Relevant historical information: Hypertension   Exam:  BP 128/84   Pulse 72   Ht '5\' 7"'$  (1.702 m)   Wt 206 lb 3.2 oz (93.5 kg)   LMP 09/10/2012   SpO2 97%   BMI 32.30 kg/m  General: Well Developed, well nourished, and in no acute distress.   MSK: L-spine: Nontender midline. Decreased lumbar motion. Lower extremity strength is intact. Sensation and reflexes are intact distally.  Right hip: Normal.  Normal motion without pain. Hip strength is intact.    Lab and Radiology Results  EXAM: LUMBAR SPINE - 2-3 VIEW   COMPARISON:  None.   FINDINGS: Five non rib-bearing lumbar type vertebra. Alignment is normal. Vertebral body heights are normal.   Mild disc space narrowing and degenerative change L1-L2 and L2-L3. Mild facet degenerative changes of the lower lumbar spine   IMPRESSION: Mild multilevel degenerative changes     Electronically Signed   By: Donavan Foil M.D.   On: 11/28/2021 15:39   I, Lynne Leader, personally (independently) visualized and performed the interpretation of the images attached in this note.     Assessment and Plan: 51 y.o. female with low back pain with pain radiating to the thigh thought to be  L3 lumbar radiculopathy on the right.  This has been ongoing since at least her first visit in March.  She has had trials of physical therapy which helped but unfortunately somewhat temporarily.  She continues her home exercise program.  Plan for MRI lumbar spine to evaluate for potential lumbar radiculopathy and for potential epidural steroid injection planning.  If MRI does not show the clear cause of pain would consider x-ray hip. Can proceed directly to epidural steroid injection after MRI.   PDMP not reviewed this encounter. Orders Placed This Encounter  Procedures   [place] LIMITED JOINT SPACE STRUCTURES UP RIGHT(NO LINKED CHARGES)    Order Specific Question:   Reason for Exam (SYMPTOM  OR DIAGNOSIS REQUIRED)    Answer:   sacroiliac    Order Specific Question:   Preferred imaging location?    Answer:   [place]   MR LUMBAR SPINE WO CONTRAST    To evaluate cause of lumbar radicular symptoms and future ESI planning    Standing Status:   Future    Standing Expiration Date:   06/04/2022    Order Specific Question:   What is the patient's sedation requirement?    Answer:   No Sedation    Order Specific Question:   Does the patient have a pacemaker or implanted devices?    Answer:   No    Order Specific Question:   Preferred imaging  location?    Answer:   [facility] (table limit-350lbs)   No orders of the defined types were placed in this encounter.    Discussed warning signs or symptoms. Please see discharge instructions. Patient expresses understanding.   The above documentation has been reviewed and is accurate and complete Lynne Leader, M.D.

## 2022-05-04 NOTE — Patient Instructions (Signed)
Thank you for coming in today.   You should hear from MRI scheduling within 1 week. If you do not hear please let me know.    Check back after MRI or may proceed directly to epidural steroid injection.

## 2022-05-05 ENCOUNTER — Ambulatory Visit (INDEPENDENT_AMBULATORY_CARE_PROVIDER_SITE_OTHER): Payer: BC Managed Care – PPO | Admitting: Family Medicine

## 2022-05-05 ENCOUNTER — Encounter (INDEPENDENT_AMBULATORY_CARE_PROVIDER_SITE_OTHER): Payer: Self-pay | Admitting: Family Medicine

## 2022-05-05 VITALS — BP 120/82 | HR 81 | Temp 98.1°F | Ht 67.0 in | Wt 202.0 lb

## 2022-05-05 DIAGNOSIS — E669 Obesity, unspecified: Secondary | ICD-10-CM

## 2022-05-05 DIAGNOSIS — E559 Vitamin D deficiency, unspecified: Secondary | ICD-10-CM | POA: Diagnosis not present

## 2022-05-05 DIAGNOSIS — Z6830 Body mass index (BMI) 30.0-30.9, adult: Secondary | ICD-10-CM

## 2022-05-05 DIAGNOSIS — G8929 Other chronic pain: Secondary | ICD-10-CM

## 2022-05-05 DIAGNOSIS — M549 Dorsalgia, unspecified: Secondary | ICD-10-CM | POA: Diagnosis not present

## 2022-05-05 DIAGNOSIS — Z6831 Body mass index (BMI) 31.0-31.9, adult: Secondary | ICD-10-CM

## 2022-05-05 MED ORDER — WEGOVY 1 MG/0.5ML ~~LOC~~ SOAJ: 1.0000 mg | SUBCUTANEOUS | 0 refills | Status: DC

## 2022-05-05 MED ORDER — VITAMIN D (ERGOCALCIFEROL) 1.25 MG (50000 UNIT) PO CAPS: 50000.0000 [IU] | ORAL_CAPSULE | ORAL | 0 refills | Status: DC

## 2022-05-07 ENCOUNTER — Ambulatory Visit (INDEPENDENT_AMBULATORY_CARE_PROVIDER_SITE_OTHER): Payer: BC Managed Care – PPO

## 2022-05-07 ENCOUNTER — Other Ambulatory Visit (HOSPITAL_COMMUNITY): Payer: Self-pay

## 2022-05-07 DIAGNOSIS — M5441 Lumbago with sciatica, right side: Secondary | ICD-10-CM | POA: Diagnosis not present

## 2022-05-07 DIAGNOSIS — G8929 Other chronic pain: Secondary | ICD-10-CM | POA: Diagnosis not present

## 2022-05-10 ENCOUNTER — Encounter (INDEPENDENT_AMBULATORY_CARE_PROVIDER_SITE_OTHER): Payer: Self-pay | Admitting: Family Medicine

## 2022-05-10 ENCOUNTER — Telehealth: Payer: Self-pay | Admitting: Family Medicine

## 2022-05-10 DIAGNOSIS — M47816 Spondylosis without myelopathy or radiculopathy, lumbar region: Secondary | ICD-10-CM

## 2022-05-10 DIAGNOSIS — E669 Obesity, unspecified: Secondary | ICD-10-CM

## 2022-05-10 DIAGNOSIS — G8929 Other chronic pain: Secondary | ICD-10-CM

## 2022-05-10 DIAGNOSIS — M25551 Pain in right hip: Secondary | ICD-10-CM

## 2022-05-10 NOTE — Telephone Encounter (Signed)
Per note on MRI, Dr. Georgina Snell wants to do a hip xray.  Pt is interested in this. Does she need to seem him or can he just order the xray? I was unsure how to proceed.

## 2022-05-10 NOTE — Progress Notes (Signed)
Lumbar spine MRI shows greatest changes that explain back pain but does not show much pinched nerve that could cause thigh pain.  Next step would be an x-ray of your hip.  Would you like me to arrange for that?

## 2022-05-11 MED ORDER — SAXENDA 18 MG/3ML ~~LOC~~ SOPN: 3.0000 mg | PEN_INJECTOR | Freq: Every day | SUBCUTANEOUS | 0 refills | Status: DC

## 2022-05-11 NOTE — Telephone Encounter (Signed)
I spoke with Caitlin Stark   Plan for hip x-ray to evaluate thigh pain.  Plan for facet injections to help treat the chronic right-sided low back pain.

## 2022-05-11 NOTE — Progress Notes (Signed)
Chief Complaint:   OBESITY Caitlin Stark is here to discuss her progress with her obesity treatment plan along with follow-up of her obesity related diagnoses. Caitlin Stark is on keeping a food journal and adhering to recommended goals of 1400 calories and 80+ grams of protein daily and states she is following her eating plan approximately 85% of the time. Caitlin Stark states she is tracking her steps 7 times per week.    Today's visit was #: 28 Starting weight: 215 lbs Starting date: 04/30/2020 Today's weight: 202 lbs Today's date: 05/05/2022 Total lbs lost to date: 13 Total lbs lost since last in-office visit: 0  Interim History: Caitlin Stark increased Wegovy to 1.7 mg and she has significant nausea. She stopped her Mancel Parsons but she is struggling to lose weight.   Subjective:   1. Vitamin D deficiency Caitlin Stark was on Vitamin D, but she stopped and her level is now below goal.   2. Chronic back pain, unspecified back location, unspecified back pain laterality Caitlin Stark is unable to exercise as much due to right back and hip pain, and occasional weakness. She has an MRI scheduled soon.   Assessment/Plan:   1. Vitamin D deficiency Caitlin Stark agreed to restart Vitamin D 50,000 IU once weekly with no refills.   - Vitamin D, Ergocalciferol, (DRISDOL) 1.25 MG (50000 UNIT) CAPS capsule; Take 1 capsule (50,000 Units total) by mouth every 7 (seven) days.  Dispense: 4 capsule; Refill: 0  2. Chronic back pain, unspecified back location, unspecified back pain laterality Caitlin Stark will hold off on her exercise until her MRI results and treatment.   3. Obesity, Current BMI 31.7 Caitlin Stark is currently in the action stage of change. As such, her goal is to continue with weight loss efforts. She has agreed to keeping a food journal and adhering to recommended goals of 1400 calories and 80+ grams of protein daily.   We discussed various medication options to help Caitlin Stark with her weight loss efforts and we both agreed to decrease  Wegovy to 1 mg once weekly, and we will refill for 1 month.  - Semaglutide-Weight Management (WEGOVY) 1 MG/0.5ML SOAJ; Inject 1 mg into the skin once a week.  Dispense: 2 mL; Refill: 0  Exercise goals: As is.   Behavioral modification strategies: increasing lean protein intake.  Caitlin Stark has agreed to follow-up with our clinic in 3 to 4 weeks. She was informed of the importance of frequent follow-up visits to maximize her success with intensive lifestyle modifications for her multiple health conditions.   Objective:   Blood pressure 120/82, pulse 81, temperature 98.1 F (36.7 C), height '5\' 7"'$  (1.702 m), weight 202 lb (91.6 kg), last menstrual period 09/10/2012, SpO2 99 %. Body mass index is 31.64 kg/m.  General: Cooperative, alert, well developed, in no acute distress. HEENT: Conjunctivae and lids unremarkable. Cardiovascular: Regular rhythm.  Lungs: Normal work of breathing. Neurologic: No focal deficits.   Lab Results  Component Value Date   CREATININE 1.08 (H) 03/23/2022   BUN 12 03/23/2022   NA 141 03/23/2022   K 4.5 03/23/2022   CL 104 03/23/2022   CO2 23 03/23/2022   Lab Results  Component Value Date   ALT 14 03/23/2022   AST 18 03/23/2022   ALKPHOS 129 (H) 03/23/2022   BILITOT 0.4 03/23/2022   Lab Results  Component Value Date   HGBA1C 5.4 03/23/2022   HGBA1C 5.8 (H) 08/04/2021   HGBA1C 5.6 10/14/2020   HGBA1C 5.8 (H) 04/30/2020   HGBA1C 5.5 05/21/2018  Lab Results  Component Value Date   INSULIN 9.8 03/23/2022   INSULIN 11.2 08/04/2021   INSULIN 14.8 10/14/2020   INSULIN 12.3 04/30/2020   INSULIN 4.5 05/21/2018   Lab Results  Component Value Date   TSH 2.090 03/23/2022   Lab Results  Component Value Date   CHOL 147 03/23/2022   HDL 48 03/23/2022   LDLCALC 85 03/23/2022   TRIG 72 03/23/2022   CHOLHDL 3.7 08/04/2021   Lab Results  Component Value Date   VD25OH 44.1 03/23/2022   VD25OH 51.2 12/16/2021   VD25OH 74.1 08/04/2021   Lab Results   Component Value Date   WBC 8.0 04/30/2020   HGB 13.3 04/30/2020   HCT 41.2 04/30/2020   MCV 86 04/30/2020   PLT 343 04/30/2020   No results found for: "IRON", "TIBC", "FERRITIN"  Attestation Statements:   Reviewed by clinician on day of visit: allergies, medications, problem list, medical history, surgical history, family history, social history, and previous encounter notes.   I, Trixie Dredge, am acting as transcriptionist for Dennard Nip, MD.  I have reviewed the above documentation for accuracy and completeness, and I agree with the above. -  Dennard Nip, MD

## 2022-05-11 NOTE — Telephone Encounter (Signed)
Please refill saxenda x 1

## 2022-05-17 ENCOUNTER — Other Ambulatory Visit: Payer: BC Managed Care – PPO

## 2022-05-30 ENCOUNTER — Ambulatory Visit (INDEPENDENT_AMBULATORY_CARE_PROVIDER_SITE_OTHER): Payer: BC Managed Care – PPO

## 2022-05-30 DIAGNOSIS — G8929 Other chronic pain: Secondary | ICD-10-CM | POA: Diagnosis not present

## 2022-05-30 DIAGNOSIS — M5441 Lumbago with sciatica, right side: Secondary | ICD-10-CM | POA: Diagnosis not present

## 2022-05-30 DIAGNOSIS — M25551 Pain in right hip: Secondary | ICD-10-CM | POA: Diagnosis not present

## 2022-06-01 NOTE — Progress Notes (Signed)
Right hip x-ray looks normal to radiology

## 2022-06-09 ENCOUNTER — Encounter (INDEPENDENT_AMBULATORY_CARE_PROVIDER_SITE_OTHER): Payer: Self-pay | Admitting: Family Medicine

## 2022-06-09 ENCOUNTER — Ambulatory Visit (INDEPENDENT_AMBULATORY_CARE_PROVIDER_SITE_OTHER): Payer: BC Managed Care – PPO | Admitting: Family Medicine

## 2022-06-09 VITALS — BP 97/61 | HR 66 | Temp 98.2°F | Ht 67.0 in | Wt 204.0 lb

## 2022-06-09 DIAGNOSIS — E669 Obesity, unspecified: Secondary | ICD-10-CM | POA: Diagnosis not present

## 2022-06-09 DIAGNOSIS — Z6832 Body mass index (BMI) 32.0-32.9, adult: Secondary | ICD-10-CM | POA: Diagnosis not present

## 2022-06-09 DIAGNOSIS — E559 Vitamin D deficiency, unspecified: Secondary | ICD-10-CM

## 2022-06-09 DIAGNOSIS — F439 Reaction to severe stress, unspecified: Secondary | ICD-10-CM

## 2022-06-09 MED ORDER — VITAMIN D (ERGOCALCIFEROL) 1.25 MG (50000 UNIT) PO CAPS: 50000.0000 [IU] | ORAL_CAPSULE | ORAL | 0 refills | Status: DC

## 2022-06-09 MED ORDER — WEGOVY 1 MG/0.5ML ~~LOC~~ SOAJ: 1.0000 mg | SUBCUTANEOUS | 0 refills | Status: DC

## 2022-06-10 ENCOUNTER — Encounter (INDEPENDENT_AMBULATORY_CARE_PROVIDER_SITE_OTHER): Payer: Self-pay | Admitting: Family Medicine

## 2022-06-14 NOTE — Progress Notes (Signed)
Chief Complaint:   OBESITY Caitlin Stark is here to discuss her progress with her obesity treatment plan along with follow-up of her obesity related diagnoses. Caitlin Stark is on keeping a food journal and adhering to recommended goals of 1400 calories and 80+ grams of protein daily and states she is following her eating plan approximately 80% of the time. Caitlin Stark states she is counting steps 5 times per week.    Today's visit was #: 16 Starting weight: 215 lbs Starting date: 04/30/2020 Today's weight: 204 lbs Today's date: 06/09/2022 Total lbs lost to date: 11 Total lbs lost since last in-office visit: 0  Interim History: Caitlin Stark has struggled to stay on track, and her stress level is high and she has not concentrated on nutrition as much as she would like.  Subjective:   1. Stress Caitlin Stark notes increased stress at work.  She feels she has increased some stress and comfort eating.  2. Vitamin D deficiency Caitlin Stark is on vitamin D prescription, with no side effects noted.  Assessment/Plan:   1. Stress Emotional eating behaviors were discussed with the patient today, and we will continue to follow closely.  2. Vitamin D deficiency We will refill prescription Vitamin D for 1 month. Caitlin Stark will follow-up for routine testing of Vitamin D, at least 2-3 times per year to avoid over-replacement.  - Vitamin D, Ergocalciferol, (DRISDOL) 1.25 MG (50000 UNIT) CAPS capsule; Take 1 capsule (50,000 Units total) by mouth every 7 (seven) days.  Dispense: 4 capsule; Refill: 0  3. Obesity, Current BMI 32.0 Caitlin Stark is currently in the action stage of change. As such, her goal is to continue with weight loss efforts. She has agreed to change to the Category 2 Plan.   We discussed various medication options to help Caitlin Stark with her weight loss efforts and we both agreed to continue Wegovy at 1 mg once weekly, and we will refill for 1 month.  - Semaglutide-Weight Management (WEGOVY) 1 MG/0.5ML SOAJ; Inject 1 mg  into the skin once a week.  Dispense: 2 mL; Refill: 0  Exercise goals: As is.   Behavioral modification strategies: increasing lean protein intake.  Caitlin Stark has agreed to follow-up with our clinic in 3 to 4 weeks. She was informed of the importance of frequent follow-up visits to maximize her success with intensive lifestyle modifications for her multiple health conditions.   Objective:   Blood pressure 97/61, pulse 66, temperature 98.2 F (36.8 C), height '5\' 7"'$  (1.702 m), weight 204 lb (92.5 kg), last menstrual period 09/10/2012, SpO2 98 %. Body mass index is 31.95 kg/m.  General: Cooperative, alert, well developed, in no acute distress. HEENT: Conjunctivae and lids unremarkable. Cardiovascular: Regular rhythm.  Lungs: Normal work of breathing. Neurologic: No focal deficits.   Lab Results  Component Value Date   CREATININE 1.08 (H) 03/23/2022   BUN 12 03/23/2022   NA 141 03/23/2022   K 4.5 03/23/2022   CL 104 03/23/2022   CO2 23 03/23/2022   Lab Results  Component Value Date   ALT 14 03/23/2022   AST 18 03/23/2022   ALKPHOS 129 (H) 03/23/2022   BILITOT 0.4 03/23/2022   Lab Results  Component Value Date   HGBA1C 5.4 03/23/2022   HGBA1C 5.8 (H) 08/04/2021   HGBA1C 5.6 10/14/2020   HGBA1C 5.8 (H) 04/30/2020   HGBA1C 5.5 05/21/2018   Lab Results  Component Value Date   INSULIN 9.8 03/23/2022   INSULIN 11.2 08/04/2021   INSULIN 14.8 10/14/2020   INSULIN  12.3 04/30/2020   INSULIN 4.5 05/21/2018   Lab Results  Component Value Date   TSH 2.090 03/23/2022   Lab Results  Component Value Date   CHOL 147 03/23/2022   HDL 48 03/23/2022   LDLCALC 85 03/23/2022   TRIG 72 03/23/2022   CHOLHDL 3.7 08/04/2021   Lab Results  Component Value Date   VD25OH 44.1 03/23/2022   VD25OH 51.2 12/16/2021   VD25OH 74.1 08/04/2021   Lab Results  Component Value Date   WBC 8.0 04/30/2020   HGB 13.3 04/30/2020   HCT 41.2 04/30/2020   MCV 86 04/30/2020   PLT 343 04/30/2020    No results found for: "IRON", "TIBC", "FERRITIN"  Attestation Statements:   Reviewed by clinician on day of visit: allergies, medications, problem list, medical history, surgical history, family history, social history, and previous encounter notes.   I, Trixie Dredge, am acting as transcriptionist for Dennard Nip, MD.  I have reviewed the above documentation for accuracy and completeness, and I agree with the above. -  Dennard Nip, MD

## 2022-06-20 ENCOUNTER — Other Ambulatory Visit: Payer: BC Managed Care – PPO

## 2022-06-20 NOTE — Telephone Encounter (Signed)
Please look this up and tell the patient

## 2022-06-29 ENCOUNTER — Encounter (INDEPENDENT_AMBULATORY_CARE_PROVIDER_SITE_OTHER): Payer: Self-pay | Admitting: Family Medicine

## 2022-06-29 NOTE — Telephone Encounter (Signed)
[place], Appt 11/1

## 2022-06-29 NOTE — Telephone Encounter (Signed)
Can you check to see if [facility] has the '1mg'$  dose?

## 2022-06-30 NOTE — Telephone Encounter (Signed)
No '1mg'$  available within [facility]

## 2022-07-06 ENCOUNTER — Ambulatory Visit (INDEPENDENT_AMBULATORY_CARE_PROVIDER_SITE_OTHER): Payer: BC Managed Care – PPO | Admitting: Family Medicine

## 2022-07-06 ENCOUNTER — Encounter (INDEPENDENT_AMBULATORY_CARE_PROVIDER_SITE_OTHER): Payer: Self-pay | Admitting: Family Medicine

## 2022-07-06 ENCOUNTER — Other Ambulatory Visit (HOSPITAL_COMMUNITY): Payer: Self-pay

## 2022-07-06 VITALS — BP 119/75 | HR 64 | Temp 98.0°F | Ht 67.0 in | Wt 207.0 lb

## 2022-07-06 DIAGNOSIS — Z6832 Body mass index (BMI) 32.0-32.9, adult: Secondary | ICD-10-CM

## 2022-07-06 DIAGNOSIS — E669 Obesity, unspecified: Secondary | ICD-10-CM

## 2022-07-06 DIAGNOSIS — E559 Vitamin D deficiency, unspecified: Secondary | ICD-10-CM | POA: Diagnosis not present

## 2022-07-06 DIAGNOSIS — R7303 Prediabetes: Secondary | ICD-10-CM | POA: Diagnosis not present

## 2022-07-06 MED ORDER — VITAMIN D (ERGOCALCIFEROL) 1.25 MG (50000 UNIT) PO CAPS: 50000.0000 [IU] | ORAL_CAPSULE | ORAL | 0 refills | Status: DC

## 2022-07-06 MED ORDER — SEMAGLUTIDE-WEIGHT MANAGEMENT 1.7 MG/0.75ML ~~LOC~~ SOAJ
1.7000 mg | SUBCUTANEOUS | 0 refills | Status: DC
  Filled 2022-07-06: qty 3, 28d supply, fill #0

## 2022-07-07 ENCOUNTER — Other Ambulatory Visit (HOSPITAL_COMMUNITY): Payer: Self-pay

## 2022-07-14 ENCOUNTER — Ambulatory Visit
Admission: RE | Admit: 2022-07-14 | Discharge: 2022-07-14 | Disposition: A | Discharge status: Home / Self Care | Payer: BC Managed Care – PPO | Source: Ambulatory Visit | Attending: Family Medicine | Admitting: Family Medicine

## 2022-07-14 DIAGNOSIS — M47816 Spondylosis without myelopathy or radiculopathy, lumbar region: Secondary | ICD-10-CM

## 2022-07-14 DIAGNOSIS — G8929 Other chronic pain: Secondary | ICD-10-CM

## 2022-07-14 MED ORDER — METHYLPREDNISOLONE ACETATE 40 MG/ML INJ SUSP (RADIOLOG
80.0000 mg | Freq: Once | INTRAMUSCULAR | Status: AC
  Administered 2022-07-14: 80 mg via INTRA_ARTICULAR

## 2022-07-14 MED ORDER — IOPAMIDOL (ISOVUE-M 200) INJECTION 41%
1.0000 mL | Freq: Once | INTRAMUSCULAR | Status: AC
  Administered 2022-07-14: 1 mL via INTRA_ARTICULAR

## 2022-07-14 NOTE — Discharge Instructions (Signed)

## 2022-07-18 NOTE — Progress Notes (Unsigned)
Chief Complaint:   OBESITY Caitlin Stark is here to discuss her progress with her obesity treatment plan along with follow-up of her obesity related diagnoses. Caitlin Stark is on the Category 2 Plan and states she is following her eating plan approximately 80% of the time. Caitlin Stark states she is walking 10,000 steps 7 times per week.    Today's visit was #: 58 Starting weight: 215 lbs Starting date: 04/30/2020 Today's weight: 207 lbs Today's date: 07/06/2022 Total lbs lost to date: 8 Total lbs lost since last in-office visit: 0  Interim History: Caitlin Stark is slowly gaining weight and she is having a very hard time with getting her Wegovy.  Her hunger is not a big problem per the patient.  She feels things giving will not be an issue.  Subjective:   1. Prediabetes Caitlin Stark has been on a GLP-1 and she was working on her diet, but she has had a difficult time finding it.  2. Vitamin D deficiency Caitlin Stark's last vitamin D level was not yet at goal.  No side effects were noted.  Assessment/Plan:   1. Prediabetes We discussed Victoza will be an option in the future if we go we cannot be found. Caitlin Stark will continue with her diet and exercise.   2. Vitamin D deficiency We will refill prescription Vitamin D for 1 month. Caitlin Stark will follow-up for routine testing of Vitamin D, at least 2-3 times per year to avoid over-replacement.  - Vitamin D, Ergocalciferol, (DRISDOL) 1.25 MG (50000 UNIT) CAPS capsule; Take 1 capsule (50,000 Units total) by mouth every 7 (seven) days.  Dispense: 4 capsule; Refill: 0  3. Obesity, Current BMI 32.5 Caitlin Stark is currently in the action stage of change. As such, her goal is to continue with weight loss efforts. She has agreed to the Category 2 Plan.   Caitlin Stark agreed to change to [facility] 1.7 mg once weekly, and we will refill for 1 month.   - Semaglutide-Weight Management 1.7 MG/0.75ML SOAJ; Inject 1.7 mg into the skin once a week for 28 days.  Dispense: 3 mL; Refill:  0  Exercise goals: As is.   Behavioral modification strategies: increasing lean protein intake and holiday eating strategies .  Caitlin Stark has agreed to follow-up with our clinic in 4 weeks. She was informed of the importance of frequent follow-up visits to maximize her success with intensive lifestyle modifications for her multiple health conditions.   Objective:   Blood pressure 119/75, pulse 64, temperature 98 F (36.7 C), height '5\' 7"'$  (1.702 m), weight 207 lb (93.9 kg), last menstrual period 09/10/2012, SpO2 99 %. Body mass index is 32.42 kg/m.  General: Cooperative, alert, well developed, in no acute distress. HEENT: Conjunctivae and lids unremarkable. Cardiovascular: Regular rhythm.  Lungs: Normal work of breathing. Neurologic: No focal deficits.   Lab Results  Component Value Date   CREATININE 1.08 (H) 03/23/2022   BUN 12 03/23/2022   NA 141 03/23/2022   K 4.5 03/23/2022   CL 104 03/23/2022   CO2 23 03/23/2022   Lab Results  Component Value Date   ALT 14 03/23/2022   AST 18 03/23/2022   ALKPHOS 129 (H) 03/23/2022   BILITOT 0.4 03/23/2022   Lab Results  Component Value Date   HGBA1C 5.4 03/23/2022   HGBA1C 5.8 (H) 08/04/2021   HGBA1C 5.6 10/14/2020   HGBA1C 5.8 (H) 04/30/2020   HGBA1C 5.5 05/21/2018   Lab Results  Component Value Date   INSULIN 9.8 03/23/2022   INSULIN 11.2 08/04/2021  INSULIN 14.8 10/14/2020   INSULIN 12.3 04/30/2020   INSULIN 4.5 05/21/2018   Lab Results  Component Value Date   TSH 2.090 03/23/2022   Lab Results  Component Value Date   CHOL 147 03/23/2022   HDL 48 03/23/2022   LDLCALC 85 03/23/2022   TRIG 72 03/23/2022   CHOLHDL 3.7 08/04/2021   Lab Results  Component Value Date   VD25OH 44.1 03/23/2022   VD25OH 51.2 12/16/2021   VD25OH 74.1 08/04/2021   Lab Results  Component Value Date   WBC 8.0 04/30/2020   HGB 13.3 04/30/2020   HCT 41.2 04/30/2020   MCV 86 04/30/2020   PLT 343 04/30/2020   No results found for:  "IRON", "TIBC", "FERRITIN"  Attestation Statements:   Reviewed by clinician on day of visit: allergies, medications, problem list, medical history, surgical history, family history, social history, and previous encounter notes.    I, Trixie Dredge, am acting as transcriptionist for Dennard Nip, MD.  I have reviewed the above documentation for accuracy and completeness, and I agree with the above. -  Dennard Nip, MD

## 2022-08-04 ENCOUNTER — Other Ambulatory Visit (HOSPITAL_COMMUNITY): Payer: Self-pay

## 2022-08-04 ENCOUNTER — Ambulatory Visit (INDEPENDENT_AMBULATORY_CARE_PROVIDER_SITE_OTHER): Payer: BC Managed Care – PPO | Admitting: Family Medicine

## 2022-08-04 VITALS — BP 122/74 | HR 71 | Temp 98.0°F | Ht 67.0 in | Wt 205.0 lb

## 2022-08-04 DIAGNOSIS — Z6830 Body mass index (BMI) 30.0-30.9, adult: Secondary | ICD-10-CM

## 2022-08-04 DIAGNOSIS — E669 Obesity, unspecified: Secondary | ICD-10-CM | POA: Diagnosis not present

## 2022-08-04 DIAGNOSIS — E559 Vitamin D deficiency, unspecified: Secondary | ICD-10-CM | POA: Diagnosis not present

## 2022-08-04 DIAGNOSIS — R7303 Prediabetes: Secondary | ICD-10-CM

## 2022-08-04 DIAGNOSIS — Z6832 Body mass index (BMI) 32.0-32.9, adult: Secondary | ICD-10-CM

## 2022-08-04 MED ORDER — SEMAGLUTIDE-WEIGHT MANAGEMENT 1.7 MG/0.75ML ~~LOC~~ SOAJ
1.7000 mg | SUBCUTANEOUS | 1 refills | Status: DC
  Filled 2022-08-04: qty 3, 28d supply, fill #0

## 2022-08-08 ENCOUNTER — Other Ambulatory Visit (HOSPITAL_COMMUNITY): Payer: Self-pay

## 2022-08-16 NOTE — Progress Notes (Signed)
Chief Complaint:   OBESITY Caitlin Stark is here to discuss her progress with her obesity treatment plan along with follow-up of her obesity related diagnoses. Caitlin Stark is on the Category 2 Plan and states she is following her eating plan approximately 85% of the time. Caitlin Stark states she is walking (steps) 7 times per week.    Today's visit was #: 65 Starting weight: 215 lbs Starting date: 04/30/2020 Today's weight: 205 lbs Today's date: 08/04/2022 Total lbs lost to date: 10 Total lbs lost since last in-office visit: 2  Interim History: Caitlin Stark has done well with avoiding weight gain over Thanksgiving.  She notes her hunger has improved and she is working on portion control.   Subjective:   1. Vitamin D deficiency Caitlin Stark is on vitamin D, and she is due to have labs rechecked soon.  2. Prediabetes Caitlin Stark's last A1c and glucose improved with GLP-1.  No side effects were noted.  Assessment/Plan:   1. Vitamin D deficiency We will recheck labs in 1 month, and See will continue her vitamin D as is.  2. Prediabetes Caitlin Stark will continue with her diet and GLP-1, and will follow-up.  We will recheck labs in 1 month.  3. Obesity, Current BMI 32.1 Caitlin Stark is currently in the action stage of change. As such, her goal is to continue with weight loss efforts. She has agreed to the Category 2 Plan.   Caitlin Stark will continue Wegovy 1.7 mg once weekly, and we will refill for 2 months.  - Semaglutide-Weight Management 1.7 MG/0.75ML SOAJ; Inject 1.7 mg into the skin once a week for 28 days.  Dispense: 3 mL; Refill: 1  Exercise goals: As is.   Behavioral modification strategies: increasing lean protein intake and meal planning and cooking strategies.  Caitlin Stark has agreed to follow-up with our clinic in 5 weeks. She was informed of the importance of frequent follow-up visits to maximize her success with intensive lifestyle modifications for her multiple health conditions.   Objective:   Blood  pressure 122/74, pulse 71, temperature 98 F (36.7 C), height '5\' 7"'$  (1.702 m), weight 205 lb (93 kg), last menstrual period 09/10/2012, SpO2 98 %. Body mass index is 32.11 kg/m.  General: Cooperative, alert, well developed, in no acute distress. HEENT: Conjunctivae and lids unremarkable. Cardiovascular: Regular rhythm.  Lungs: Normal work of breathing. Neurologic: No focal deficits.   Lab Results  Component Value Date   CREATININE 1.08 (H) 03/23/2022   BUN 12 03/23/2022   NA 141 03/23/2022   K 4.5 03/23/2022   CL 104 03/23/2022   CO2 23 03/23/2022   Lab Results  Component Value Date   ALT 14 03/23/2022   AST 18 03/23/2022   ALKPHOS 129 (H) 03/23/2022   BILITOT 0.4 03/23/2022   Lab Results  Component Value Date   HGBA1C 5.4 03/23/2022   HGBA1C 5.8 (H) 08/04/2021   HGBA1C 5.6 10/14/2020   HGBA1C 5.8 (H) 04/30/2020   HGBA1C 5.5 05/21/2018   Lab Results  Component Value Date   INSULIN 9.8 03/23/2022   INSULIN 11.2 08/04/2021   INSULIN 14.8 10/14/2020   INSULIN 12.3 04/30/2020   INSULIN 4.5 05/21/2018   Lab Results  Component Value Date   TSH 2.090 03/23/2022   Lab Results  Component Value Date   CHOL 147 03/23/2022   HDL 48 03/23/2022   LDLCALC 85 03/23/2022   TRIG 72 03/23/2022   CHOLHDL 3.7 08/04/2021   Lab Results  Component Value Date   VD25OH 44.1 03/23/2022  VD25OH 51.2 12/16/2021   VD25OH 74.1 08/04/2021   Lab Results  Component Value Date   WBC 8.0 04/30/2020   HGB 13.3 04/30/2020   HCT 41.2 04/30/2020   MCV 86 04/30/2020   PLT 343 04/30/2020   No results found for: "IRON", "TIBC", "FERRITIN"  Attestation Statements:   Reviewed by clinician on day of visit: allergies, medications, problem list, medical history, surgical history, family history, social history, and previous encounter notes.   I, Trixie Dredge, am acting as transcriptionist for Dennard Nip, MD.  I have reviewed the above documentation for accuracy and completeness,  and I agree with the above. -  Dennard Nip, MD

## 2022-09-14 ENCOUNTER — Other Ambulatory Visit (HOSPITAL_COMMUNITY): Payer: Self-pay

## 2022-09-14 ENCOUNTER — Encounter (INDEPENDENT_AMBULATORY_CARE_PROVIDER_SITE_OTHER): Payer: Self-pay | Admitting: Family Medicine

## 2022-09-14 ENCOUNTER — Ambulatory Visit (INDEPENDENT_AMBULATORY_CARE_PROVIDER_SITE_OTHER): Payer: BC Managed Care – PPO | Admitting: Family Medicine

## 2022-09-14 VITALS — BP 121/72 | HR 62 | Temp 98.1°F | Ht 67.0 in | Wt 204.0 lb

## 2022-09-14 DIAGNOSIS — Z6832 Body mass index (BMI) 32.0-32.9, adult: Secondary | ICD-10-CM

## 2022-09-14 DIAGNOSIS — R7303 Prediabetes: Secondary | ICD-10-CM | POA: Diagnosis not present

## 2022-09-14 DIAGNOSIS — E669 Obesity, unspecified: Secondary | ICD-10-CM | POA: Diagnosis not present

## 2022-09-14 DIAGNOSIS — E559 Vitamin D deficiency, unspecified: Secondary | ICD-10-CM

## 2022-09-14 DIAGNOSIS — L659 Nonscarring hair loss, unspecified: Secondary | ICD-10-CM | POA: Diagnosis not present

## 2022-09-14 MED ORDER — SAXENDA 18 MG/3ML ~~LOC~~ SOPN
1.2000 mg | PEN_INJECTOR | Freq: Every day | SUBCUTANEOUS | 0 refills | Status: DC
  Filled 2022-09-14: qty 6, 30d supply, fill #0

## 2022-09-15 ENCOUNTER — Other Ambulatory Visit (HOSPITAL_COMMUNITY): Payer: Self-pay

## 2022-09-15 LAB — CMP14+EGFR
ALT: 20 IU/L (ref 0–32)
AST: 19 IU/L (ref 0–40)
Albumin/Globulin Ratio: 1.5 (ref 1.2–2.2)
Albumin: 4.6 g/dL (ref 3.8–4.9)
Alkaline Phosphatase: 131 IU/L — ABNORMAL HIGH (ref 44–121)
BUN/Creatinine Ratio: 12 (ref 9–23)
BUN: 13 mg/dL (ref 6–24)
Bilirubin Total: 0.3 mg/dL (ref 0.0–1.2)
CO2: 25 mmol/L (ref 20–29)
Calcium: 9.9 mg/dL (ref 8.7–10.2)
Chloride: 102 mmol/L (ref 96–106)
Creatinine, Ser: 1.08 mg/dL — ABNORMAL HIGH (ref 0.57–1.00)
Globulin, Total: 3 g/dL (ref 1.5–4.5)
Glucose: 72 mg/dL (ref 70–99)
Potassium: 4.2 mmol/L (ref 3.5–5.2)
Sodium: 142 mmol/L (ref 134–144)
Total Protein: 7.6 g/dL (ref 6.0–8.5)
eGFR: 62 mL/min/{1.73_m2} (ref >59)

## 2022-09-15 LAB — LIPID PANEL WITH LDL/HDL RATIO
Cholesterol, Total: 154 mg/dL (ref 100–199)
HDL: 47 mg/dL (ref >39)
LDL Chol Calc (NIH): 93 mg/dL (ref 0–99)
LDL/HDL Ratio: 2 ratio (ref 0.0–3.2)
Triglycerides: 73 mg/dL (ref 0–149)
VLDL Cholesterol Cal: 14 mg/dL (ref 5–40)

## 2022-09-15 LAB — VITAMIN B12: Vitamin B-12: 782 pg/mL (ref 232–1245)

## 2022-09-15 LAB — INSULIN, RANDOM: INSULIN: 22.7 u[IU]/mL (ref 2.6–24.9)

## 2022-09-15 LAB — VITAMIN D 25 HYDROXY (VIT D DEFICIENCY, FRACTURES): Vit D, 25-Hydroxy: 54 ng/mL (ref 30.0–100.0)

## 2022-09-15 LAB — HEMOGLOBIN A1C
Est. average glucose Bld gHb Est-mCnc: 114 mg/dL
Hgb A1c MFr Bld: 5.6 % (ref 4.8–5.6)

## 2022-09-15 LAB — TSH: TSH: 1.9 u[IU]/mL (ref 0.450–4.500)

## 2022-09-15 LAB — PREALBUMIN: PREALBUMIN: 25 mg/dL (ref 10–36)

## 2022-09-20 ENCOUNTER — Encounter (INDEPENDENT_AMBULATORY_CARE_PROVIDER_SITE_OTHER): Payer: Self-pay | Admitting: Family Medicine

## 2022-09-20 ENCOUNTER — Other Ambulatory Visit (HOSPITAL_COMMUNITY): Payer: Self-pay

## 2022-09-21 ENCOUNTER — Encounter (INDEPENDENT_AMBULATORY_CARE_PROVIDER_SITE_OTHER): Payer: Self-pay | Admitting: Family Medicine

## 2022-09-21 ENCOUNTER — Other Ambulatory Visit (HOSPITAL_COMMUNITY): Payer: Self-pay

## 2022-09-21 ENCOUNTER — Telehealth (INDEPENDENT_AMBULATORY_CARE_PROVIDER_SITE_OTHER): Payer: Self-pay | Admitting: Family Medicine

## 2022-09-21 NOTE — Telephone Encounter (Signed)
Caitlin Stark (Key: [id number])  Your information has been submitted to [place]. To check for an updated outcome later, reopen this PA request from your dashboard.  If [employer] has not responded to your request within 24 hours, contact [employer] at [phone]. If you think there may be a problem with your PA request, use our live chat feature at the bottom right.

## 2022-09-23 NOTE — Telephone Encounter (Signed)
Message from plan: Your PA request has been approved. Additional information will be provided in the approval communication. (Message 1145)

## 2022-09-26 NOTE — Progress Notes (Signed)
Chief Complaint:   OBESITY Camy is here to discuss her progress with her obesity treatment plan along with follow-up of her obesity related diagnoses. Avanni is on the Category 2 Plan and states she is following her eating plan approximately 80% of the time. Pebbles states she is walking 7 times per week.    Today's visit was #: 29 Starting weight: 215 lbs Starting date: 04/30/2020 Today's weight: 204 lbs Today's date: 09/14/2022 Total lbs lost to date: 11 Total lbs lost since last in-office visit: 1  Interim History: Ruther has many questions about Wegovy and GLP-1's, especially related to side effects and health risks.   Subjective:   1. Hair thinning Shacola has a new diagnosis. She notes hair thinning and she wonders if it is related to her medications. She denies alopecia, and she is working on her diet.   2. Vitamin D deficiency Majesty is on Vitamin D, and she is due to have labs rechecked. She shows no signs of over-replacement.   3. Prediabetes Julianny is working on her diet and weight loss to treat her pre-diabetes, and help prevent diabetes mellitus. She is on a GLP-1 as well. She is due for labs today.   Assessment/Plan:   1. Hair thinning We will check labs today. Sonam is to make sure to meet her protein goals.   - Vitamin B12 - TSH - Prealbumin - Albumin  2. Vitamin D deficiency We will check labs today, and we will follow-up at Marshawn's next visit.   - VITAMIN D 25 Hydroxy (Vit-D Deficiency, Fractures)  3. Prediabetes We will check labs today. Meckenzie will continue to work on her diet, exercise, and decreasing simple carbohydrates to help decrease the risk of diabetes.   - CMP14+EGFR - Vitamin B12 - Insulin, random - Hemoglobin A1c - Lipid Panel With LDL/HDL Ratio  4. Obesity, Current BMI 32.0 We discussed various medication options to help Angely with her weight loss efforts and we both agreed to discontinue Wegovy, and start Saxenda 3 mg once  daily with no refills (patient is to start at 1.2 mg). Side effects of GLP-1 and mechanism of action for these medications were discussed in depth with the patient today.  - Liraglutide -Weight Management (SAXENDA) 18 MG/3ML SOPN; Inject 1.2 mg into the skin daily.  Dispense: 15 mL; Refill: 0  Jenasia is currently in the action stage of change. As such, her goal is to continue with weight loss efforts. She has agreed to keeping a food journal and adhering to recommended goals of 1400 calories and 80+ grams of protein daily.   Exercise goals: As is.   Behavioral modification strategies: increasing lean protein intake.  Mayelin has agreed to follow-up with our clinic in 4 weeks. She was informed of the importance of frequent follow-up visits to maximize her success with intensive lifestyle modifications for her multiple health conditions.   Ariyon was informed we would discuss her lab results at her next visit unless there is a critical issue that needs to be addressed sooner. Onnie agreed to keep her next visit at the agreed upon time to discuss these results.  Objective:   Blood pressure 121/72, pulse 62, temperature 98.1 F (36.7 C), height '5\' 7"'$  (1.702 m), weight 204 lb (92.5 kg), last menstrual period 09/10/2012, SpO2 98 %. Body mass index is 31.95 kg/m.  General: Cooperative, alert, well developed, in no acute distress. HEENT: Conjunctivae and lids unremarkable. Cardiovascular: Regular rhythm.  Lungs: Normal work of breathing.  Neurologic: No focal deficits.   Lab Results  Component Value Date   CREATININE 1.08 (H) 09/14/2022   BUN 13 09/14/2022   NA 142 09/14/2022   K 4.2 09/14/2022   CL 102 09/14/2022   CO2 25 09/14/2022   Lab Results  Component Value Date   ALT 20 09/14/2022   AST 19 09/14/2022   ALKPHOS 131 (H) 09/14/2022   BILITOT 0.3 09/14/2022   Lab Results  Component Value Date   HGBA1C 5.6 09/14/2022   HGBA1C 5.4 03/23/2022   HGBA1C 5.8 (H) 08/04/2021    HGBA1C 5.6 10/14/2020   HGBA1C 5.8 (H) 04/30/2020   Lab Results  Component Value Date   INSULIN 22.7 09/14/2022   INSULIN 9.8 03/23/2022   INSULIN 11.2 08/04/2021   INSULIN 14.8 10/14/2020   INSULIN 12.3 04/30/2020   Lab Results  Component Value Date   TSH 1.900 09/14/2022   Lab Results  Component Value Date   CHOL 154 09/14/2022   HDL 47 09/14/2022   LDLCALC 93 09/14/2022   TRIG 73 09/14/2022   CHOLHDL 3.7 08/04/2021   Lab Results  Component Value Date   VD25OH 54.0 09/14/2022   VD25OH 44.1 03/23/2022   VD25OH 51.2 12/16/2021   Lab Results  Component Value Date   WBC 8.0 04/30/2020   HGB 13.3 04/30/2020   HCT 41.2 04/30/2020   MCV 86 04/30/2020   PLT 343 04/30/2020   No results found for: "IRON", "TIBC", "FERRITIN"  Attestation Statements:   Reviewed by clinician on day of visit: allergies, medications, problem list, medical history, surgical history, family history, social history, and previous encounter notes.  Time spent on visit including pre-visit chart review and post-visit care and charting was 40 minutes.   I, Trixie Dredge, am acting as transcriptionist for Dennard Nip, MD.  I have reviewed the above documentation for accuracy and completeness, and I agree with the above. -  Dennard Nip, MD

## 2022-10-12 ENCOUNTER — Ambulatory Visit (INDEPENDENT_AMBULATORY_CARE_PROVIDER_SITE_OTHER): Payer: BC Managed Care – PPO | Admitting: Family Medicine

## 2022-10-12 ENCOUNTER — Encounter (INDEPENDENT_AMBULATORY_CARE_PROVIDER_SITE_OTHER): Payer: Self-pay | Admitting: Family Medicine

## 2022-10-12 VITALS — BP 119/75 | HR 74 | Temp 97.9°F | Ht 67.0 in | Wt 209.0 lb

## 2022-10-12 DIAGNOSIS — E559 Vitamin D deficiency, unspecified: Secondary | ICD-10-CM

## 2022-10-12 DIAGNOSIS — R7303 Prediabetes: Secondary | ICD-10-CM | POA: Diagnosis not present

## 2022-10-12 DIAGNOSIS — Z6834 Body mass index (BMI) 34.0-34.9, adult: Secondary | ICD-10-CM | POA: Insufficient documentation

## 2022-10-12 DIAGNOSIS — Z6833 Body mass index (BMI) 33.0-33.9, adult: Secondary | ICD-10-CM | POA: Insufficient documentation

## 2022-10-12 DIAGNOSIS — Z6830 Body mass index (BMI) 30.0-30.9, adult: Secondary | ICD-10-CM

## 2022-10-12 DIAGNOSIS — Z6832 Body mass index (BMI) 32.0-32.9, adult: Secondary | ICD-10-CM | POA: Insufficient documentation

## 2022-10-12 DIAGNOSIS — E669 Obesity, unspecified: Secondary | ICD-10-CM | POA: Diagnosis not present

## 2022-10-12 DIAGNOSIS — Z6835 Body mass index (BMI) 35.0-35.9, adult: Secondary | ICD-10-CM | POA: Insufficient documentation

## 2022-10-12 MED ORDER — BD PEN NEEDLE NANO U/F 32G X 4 MM MISC: 1.0000 [IU] | 0 refills | Status: AC

## 2022-10-12 MED ORDER — VITAMIN D (ERGOCALCIFEROL) 1.25 MG (50000 UNIT) PO CAPS: 50000.0000 [IU] | ORAL_CAPSULE | ORAL | 0 refills | Status: DC

## 2022-10-12 MED ORDER — SAXENDA 18 MG/3ML ~~LOC~~ SOPN
3.0000 mg | PEN_INJECTOR | Freq: Every day | SUBCUTANEOUS | 0 refills | Status: DC
  Filled 2022-10-17: qty 15, 30d supply, fill #0

## 2022-10-17 ENCOUNTER — Other Ambulatory Visit (HOSPITAL_COMMUNITY): Payer: Self-pay

## 2022-10-26 NOTE — Progress Notes (Signed)
Chief Complaint:   OBESITY Caitlin Stark is here to discuss her progress with her obesity treatment plan along with follow-up of her obesity related diagnoses. Caitlin Stark is on keeping a food journal and adhering to recommended goals of 1400 calories and 80+ grams of protein and states she is following her eating plan approximately 60% of the time. Caitlin Stark states she is doing 0 minutes 0 times per week.  Today's visit was #: 81 Starting weight: 215 lbs Starting date: 04/30/2020 Today's weight: 209 lbs Today's date: 10/12/2022 Total lbs lost to date: 6 Total lbs lost since last in-office visit: 0  Interim History: Anitria has struggled more with weight loss. She is not getting as much hunger control with Saxenda but she is still on a lower dose.   Subjective:   1. Prediabetes Caitlin Stark is working on her diet but she has struggled with increased polyphagia.   2. Vitamin D deficiency Caitlin Stark is on Vitamin D prescription with no side effects noted.   Assessment/Plan:   1. Prediabetes Caitlin Stark will get back to strict journaling and continue GLP-1. We will refill nano needles #100.  - Insulin Pen Needle (BD PEN NEEDLE NANO U/F) 32G X 4 MM MISC; 1 Units by Does not apply route 1 day or 1 dose for 1 dose.  Dispense: 100 each; Refill: 0  2. Vitamin D deficiency We will refill prescription Vitamin D, and we will check labs in 1-2 months.   - Vitamin D, Ergocalciferol, (DRISDOL) 1.25 MG (50000 UNIT) CAPS capsule; Take 1 capsule (50,000 Units total) by mouth every 7 (seven) days.  Dispense: 4 capsule; Refill: 0  3. BMI 32.0-32.9,adult  - Liraglutide -Weight Management (SAXENDA) 18 MG/3ML SOPN; Inject 3 mg into the skin daily.  Dispense: 15 mL; Refill: 0 - Insulin Pen Needle (BD PEN NEEDLE NANO U/F) 32G X 4 MM MISC; 1 Units by Does not apply route 1 day or 1 dose for 1 dose.  Dispense: 100 each; Refill: 0  4. Obesity, Beginning BMI 30.85 We will refill Saxenda 3 mg once daily (patient is to increase to  1.8 mg for now); and nano needles.   - Liraglutide -Weight Management (SAXENDA) 18 MG/3ML SOPN; Inject 3 mg into the skin daily.  Dispense: 15 mL; Refill: 0 - Insulin Pen Needle (BD PEN NEEDLE NANO U/F) 32G X 4 MM MISC; 1 Units by Does not apply route 1 day or 1 dose for 1 dose.  Dispense: 100 each; Refill: 0  Caitlin Stark is currently in the action stage of change. As such, her goal is to continue with weight loss efforts. She has agreed to keeping a food journal and adhering to recommended goals of 1400 calories and 80+ grams of protein daily.   Behavioral modification strategies: increasing lean protein intake and keeping a strict food journal.  Caitlin Stark has agreed to follow-up with our clinic in 3 to 4 weeks. She was informed of the importance of frequent follow-up visits to maximize her success with intensive lifestyle modifications for her multiple health conditions.   Objective:   Blood pressure 119/75, pulse 74, temperature 97.9 F (36.6 C), height 5' 7"$  (1.702 m), weight 209 lb (94.8 kg), last menstrual period 09/10/2012, SpO2 96 %. Body mass index is 32.73 kg/m.  General: Cooperative, alert, well developed, in no acute distress. HEENT: Conjunctivae and lids unremarkable. Cardiovascular: Regular rhythm.  Lungs: Normal work of breathing. Neurologic: No focal deficits.   Lab Results  Component Value Date   CREATININE 1.08 (H)  09/14/2022   BUN 13 09/14/2022   NA 142 09/14/2022   K 4.2 09/14/2022   CL 102 09/14/2022   CO2 25 09/14/2022   Lab Results  Component Value Date   ALT 20 09/14/2022   AST 19 09/14/2022   ALKPHOS 131 (H) 09/14/2022   BILITOT 0.3 09/14/2022   Lab Results  Component Value Date   HGBA1C 5.6 09/14/2022   HGBA1C 5.4 03/23/2022   HGBA1C 5.8 (H) 08/04/2021   HGBA1C 5.6 10/14/2020   HGBA1C 5.8 (H) 04/30/2020   Lab Results  Component Value Date   INSULIN 22.7 09/14/2022   INSULIN 9.8 03/23/2022   INSULIN 11.2 08/04/2021   INSULIN 14.8 10/14/2020    INSULIN 12.3 04/30/2020   Lab Results  Component Value Date   TSH 1.900 09/14/2022   Lab Results  Component Value Date   CHOL 154 09/14/2022   HDL 47 09/14/2022   LDLCALC 93 09/14/2022   TRIG 73 09/14/2022   CHOLHDL 3.7 08/04/2021   Lab Results  Component Value Date   VD25OH 54.0 09/14/2022   VD25OH 44.1 03/23/2022   VD25OH 51.2 12/16/2021   Lab Results  Component Value Date   WBC 8.0 04/30/2020   HGB 13.3 04/30/2020   HCT 41.2 04/30/2020   MCV 86 04/30/2020   PLT 343 04/30/2020   No results found for: "IRON", "TIBC", "FERRITIN"  Attestation Statements:   Reviewed by clinician on day of visit: allergies, medications, problem list, medical history, surgical history, family history, social history, and previous encounter notes.   I, Trixie Dredge, am acting as transcriptionist for Dennard Nip, MD.  I have reviewed the above documentation for accuracy and completeness, and I agree with the above. -  Dennard Nip, MD

## 2022-11-10 ENCOUNTER — Encounter (INDEPENDENT_AMBULATORY_CARE_PROVIDER_SITE_OTHER): Payer: Self-pay | Admitting: Family Medicine

## 2022-11-10 ENCOUNTER — Ambulatory Visit (INDEPENDENT_AMBULATORY_CARE_PROVIDER_SITE_OTHER): Payer: BC Managed Care – PPO | Admitting: Family Medicine

## 2022-11-10 VITALS — BP 114/69 | HR 65 | Temp 98.0°F | Ht 67.0 in | Wt 213.0 lb

## 2022-11-10 DIAGNOSIS — E669 Obesity, unspecified: Secondary | ICD-10-CM

## 2022-11-10 DIAGNOSIS — Z6833 Body mass index (BMI) 33.0-33.9, adult: Secondary | ICD-10-CM

## 2022-11-10 DIAGNOSIS — E559 Vitamin D deficiency, unspecified: Secondary | ICD-10-CM | POA: Diagnosis not present

## 2022-11-10 DIAGNOSIS — R7303 Prediabetes: Secondary | ICD-10-CM

## 2022-11-10 DIAGNOSIS — Z6832 Body mass index (BMI) 32.0-32.9, adult: Secondary | ICD-10-CM

## 2022-11-10 MED ORDER — SAXENDA 18 MG/3ML ~~LOC~~ SOPN: 3.0000 mg | PEN_INJECTOR | Freq: Every day | SUBCUTANEOUS | 0 refills | Status: DC

## 2022-11-10 MED ORDER — VITAMIN D (ERGOCALCIFEROL) 1.25 MG (50000 UNIT) PO CAPS: 50000.0000 [IU] | ORAL_CAPSULE | ORAL | 0 refills | Status: DC

## 2022-11-22 NOTE — Progress Notes (Signed)
Chief Complaint:   OBESITY Caitlin Stark is here to discuss her progress with her obesity treatment plan along with follow-up of her obesity related diagnoses. Caitlin Stark is on keeping a food journal and adhering to recommended goals of 1400 calories and 80+ grams of protein and states she is following her eating plan approximately 85% of the time. Jeffie states she is walking the hills and stairs for 45 minutes 7 times per week.  Today's visit was #: 67 Starting weight: 215 lbs Starting date: 04/30/2020 Today's weight: 213 lbs Today's date: 11/10/2022 Total lbs lost to date: 2 Total lbs lost since last in-office visit: 0  Interim History: Caitlin Stark is retaining some fluid and she has an increase in muscle mass according to our bioimpedance scale. She is frustrated at how difficult weight loss has been, which is completely understandable. She is getting minimal hunger control on Saxenda at 1.8 mg.   Subjective:   1. Prediabetes Caitlin Stark is on Saxenda 1.8 mg.  2. Vitamin D deficiency Caitlin Stark is on Vitamin D prescription. No side effects were noted.   Assessment/Plan:   1. Prediabetes [name] will continue Saxenda, and we will refill for 1 month.   - Liraglutide -Weight Management (SAXENDA) 18 MG/3ML SOPN; Inject 3 mg into the skin daily.  Dispense: 15 mL; Refill: 0  2. Vitamin D deficiency Caitlin Stark will continue prescription Vitamin D, and we will refill for 1 month.   - Vitamin D, Ergocalciferol, (DRISDOL) 1.25 MG (50000 UNIT) CAPS capsule; Take 1 capsule (50,000 Units total) by mouth every 7 (seven) days.  Dispense: 4 capsule; Refill: 0  3. BMI 32.0-32.9,adult - Liraglutide -Weight Management (SAXENDA) 18 MG/3ML SOPN; Inject 3 mg into the skin daily.  Dispense: 15 mL; Refill: 0  4. Obesity, Beginning BMI 30.85 We will refill Saxenda 3 mg for 1 month. (Patient is to increase to 2.4 mg over the next 4 weeks).   - Liraglutide -Weight Management (SAXENDA) 18 MG/3ML SOPN; Inject 3 mg into the  skin daily.  Dispense: 15 mL; Refill: 0  Caitlin Stark is currently in the action stage of change. As such, her goal is to continue with weight loss efforts. She has agreed to keeping a food journal and adhering to recommended goals of 1400 calories and 80+ grams of protein daily.   Exercise goals: As is.   Behavioral modification strategies: increasing lean protein intake.  Caitlin Stark has agreed to follow-up with our clinic in 4 weeks. She was informed of the importance of frequent follow-up visits to maximize her success with intensive lifestyle modifications for her multiple health conditions.   Objective:   Blood pressure 114/69, pulse 65, temperature 98 F (36.7 C), height 5\' 7"  (1.702 m), weight 213 lb (96.6 kg), last menstrual period 09/10/2012, SpO2 96 %. Body mass index is 33.36 kg/m.  Lab Results  Component Value Date   CREATININE 1.08 (H) 09/14/2022   BUN 13 09/14/2022   NA 142 09/14/2022   K 4.2 09/14/2022   CL 102 09/14/2022   CO2 25 09/14/2022   Lab Results  Component Value Date   ALT 20 09/14/2022   AST 19 09/14/2022   ALKPHOS 131 (H) 09/14/2022   BILITOT 0.3 09/14/2022   Lab Results  Component Value Date   HGBA1C 5.6 09/14/2022   HGBA1C 5.4 03/23/2022   HGBA1C 5.8 (H) 08/04/2021   HGBA1C 5.6 10/14/2020   HGBA1C 5.8 (H) 04/30/2020   Lab Results  Component Value Date   INSULIN 22.7 09/14/2022  INSULIN 9.8 03/23/2022   INSULIN 11.2 08/04/2021   INSULIN 14.8 10/14/2020   INSULIN 12.3 04/30/2020   Lab Results  Component Value Date   TSH 1.900 09/14/2022   Lab Results  Component Value Date   CHOL 154 09/14/2022   HDL 47 09/14/2022   LDLCALC 93 09/14/2022   TRIG 73 09/14/2022   CHOLHDL 3.7 08/04/2021   Lab Results  Component Value Date   VD25OH 54.0 09/14/2022   VD25OH 44.1 03/23/2022   VD25OH 51.2 12/16/2021   Lab Results  Component Value Date   WBC 8.0 04/30/2020   HGB 13.3 04/30/2020   HCT 41.2 04/30/2020   MCV 86 04/30/2020   PLT 343  04/30/2020   No results found for: "IRON", "TIBC", "FERRITIN"  Attestation Statements:   Reviewed by clinician on day of visit: allergies, medications, problem list, medical history, surgical history, family history, social history, and previous encounter notes.   I, Trixie Dredge, am acting as transcriptionist for Dennard Nip, MD.  I have reviewed the above documentation for accuracy and completeness, and I agree with the above. -  Dennard Nip, MD

## 2022-12-08 ENCOUNTER — Encounter (INDEPENDENT_AMBULATORY_CARE_PROVIDER_SITE_OTHER): Payer: Self-pay | Admitting: Family Medicine

## 2022-12-08 ENCOUNTER — Ambulatory Visit (INDEPENDENT_AMBULATORY_CARE_PROVIDER_SITE_OTHER): Payer: BC Managed Care – PPO | Admitting: Family Medicine

## 2022-12-08 DIAGNOSIS — E669 Obesity, unspecified: Secondary | ICD-10-CM | POA: Diagnosis not present

## 2022-12-08 DIAGNOSIS — R7303 Prediabetes: Secondary | ICD-10-CM | POA: Diagnosis not present

## 2022-12-08 DIAGNOSIS — Z6833 Body mass index (BMI) 33.0-33.9, adult: Secondary | ICD-10-CM | POA: Diagnosis not present

## 2022-12-08 MED ORDER — SAXENDA 18 MG/3ML ~~LOC~~ SOPN
3.0000 mg | PEN_INJECTOR | Freq: Every day | SUBCUTANEOUS | 0 refills | Status: DC
  Filled 2022-12-08: qty 15, 30d supply, fill #0

## 2022-12-08 MED ORDER — METFORMIN HCL 500 MG PO TABS
500.0000 mg | ORAL_TABLET | Freq: Two times a day (BID) | ORAL | 0 refills | Status: DC
  Filled 2022-12-08: qty 60, 30d supply, fill #0

## 2022-12-09 ENCOUNTER — Other Ambulatory Visit (HOSPITAL_COMMUNITY): Payer: Self-pay

## 2022-12-10 ENCOUNTER — Other Ambulatory Visit (HOSPITAL_COMMUNITY): Payer: Self-pay

## 2022-12-12 NOTE — Progress Notes (Unsigned)
Chief Complaint:   OBESITY Caitlin Stark is here to discuss her progress with her obesity treatment plan along with follow-up of her obesity related diagnoses. Caitlin Stark is on keeping a food journal and adhering to recommended goals of 1400 calories and 80+ grams of protein and states she is following her eating plan approximately 85% of the time. Caitlin Stark states she is walking 7 times per week.    Today's visit was #: 35 Starting weight: 215 lbs Starting date: 04/30/2020 Today's weight: 213 lbs Today's date: 12/08/2022 Total lbs lost to date: 2 Total lbs lost since last in-office visit: 0  Interim History: Caitlin Stark has done well with maintaining her weight loss.  She is doing well with meeting her protein goals.  Her hunger is mostly controlled.  Subjective:   1. Prediabetes Caitlin Stark continues to work on her diet.  She is stable on Saxenda with no nausea or vomiting noted.  Her insurance may not continue to cover this however.  Assessment/Plan:   1. Prediabetes Caitlin Stark agreed to start metformin 500 mg twice daily with no refills (she is to start if she is unable to get Saxenda).  We will refill Saxenda for 1 month.  - Liraglutide -Weight Management (SAXENDA) 18 MG/3ML SOPN; Inject 3 mg into the skin daily.  Dispense: 15 mL; Refill: 0 - metFORMIN (GLUCOPHAGE) 500 MG tablet; Take 1 tablet (500 mg total) by mouth 2 (two) times daily with a meal.  Dispense: 60 tablet; Refill: 0  2. BMI 33.0-33.9,adult - Liraglutide -Weight Management (SAXENDA) 18 MG/3ML SOPN; Inject 3 mg into the skin daily.  Dispense: 15 mL; Refill: 0  3. Obesity, Beginning BMI 30.85 Will refill Saxenda for 1 month.  - Liraglutide -Weight Management (SAXENDA) 18 MG/3ML SOPN; Inject 3 mg into the skin daily.  Dispense: 15 mL; Refill: 0  Caitlin Stark is currently in the action stage of change. As such, her goal is to continue with weight loss efforts. She has agreed to keeping a food journal and adhering to recommended goals of 1400  calories and 80+ grams of protein daily.   Exercise goals: As is.   Behavioral modification strategies: increasing lean protein intake.  Caitlin Stark has agreed to follow-up with our clinic in 4 weeks. She was informed of the importance of frequent follow-up visits to maximize her success with intensive lifestyle modifications for her multiple health conditions.   Objective:   Blood pressure 120/73, pulse 67, temperature 98.1 F (36.7 C), height 5\' 7"  (1.702 m), weight 213 lb (96.6 kg), last menstrual period 09/10/2012, SpO2 98 %. Body mass index is 33.36 kg/m.  Lab Results  Component Value Date   CREATININE 1.08 (H) 09/14/2022   BUN 13 09/14/2022   NA 142 09/14/2022   K 4.2 09/14/2022   CL 102 09/14/2022   CO2 25 09/14/2022   Lab Results  Component Value Date   ALT 20 09/14/2022   AST 19 09/14/2022   ALKPHOS 131 (H) 09/14/2022   BILITOT 0.3 09/14/2022   Lab Results  Component Value Date   HGBA1C 5.6 09/14/2022   HGBA1C 5.4 03/23/2022   HGBA1C 5.8 (H) 08/04/2021   HGBA1C 5.6 10/14/2020   HGBA1C 5.8 (H) 04/30/2020   Lab Results  Component Value Date   INSULIN 22.7 09/14/2022   INSULIN 9.8 03/23/2022   INSULIN 11.2 08/04/2021   INSULIN 14.8 10/14/2020   INSULIN 12.3 04/30/2020   Lab Results  Component Value Date   TSH 1.900 09/14/2022   Lab Results  Component  Value Date   CHOL 154 09/14/2022   HDL 47 09/14/2022   LDLCALC 93 09/14/2022   TRIG 73 09/14/2022   CHOLHDL 3.7 08/04/2021   Lab Results  Component Value Date   VD25OH 54.0 09/14/2022   VD25OH 44.1 03/23/2022   VD25OH 51.2 12/16/2021   Lab Results  Component Value Date   WBC 8.0 04/30/2020   HGB 13.3 04/30/2020   HCT 41.2 04/30/2020   MCV 86 04/30/2020   PLT 343 04/30/2020   No results found for: "IRON", "TIBC", "FERRITIN"  Attestation Statements:   Reviewed by clinician on day of visit: allergies, medications, problem list, medical history, surgical history, family history, social history,  and previous encounter notes.   I, Burt Knack, am acting as transcriptionist for Quillian Quince, MD.  I have reviewed the above documentation for accuracy and completeness, and I agree with the above. -  Quillian Quince, MD

## 2022-12-20 ENCOUNTER — Other Ambulatory Visit (HOSPITAL_COMMUNITY): Payer: Self-pay

## 2022-12-28 ENCOUNTER — Other Ambulatory Visit (HOSPITAL_COMMUNITY): Payer: Self-pay

## 2023-01-03 ENCOUNTER — Ambulatory Visit (INDEPENDENT_AMBULATORY_CARE_PROVIDER_SITE_OTHER): Payer: BC Managed Care – PPO | Admitting: Family Medicine

## 2023-02-02 ENCOUNTER — Ambulatory Visit (INDEPENDENT_AMBULATORY_CARE_PROVIDER_SITE_OTHER): Payer: BC Managed Care – PPO | Admitting: Family Medicine

## 2023-02-02 ENCOUNTER — Encounter (INDEPENDENT_AMBULATORY_CARE_PROVIDER_SITE_OTHER): Payer: Self-pay | Admitting: Family Medicine

## 2023-02-02 VITALS — BP 121/73 | HR 85 | Temp 98.7°F | Ht 67.0 in | Wt 216.0 lb

## 2023-02-02 DIAGNOSIS — E860 Dehydration: Secondary | ICD-10-CM

## 2023-02-02 DIAGNOSIS — Z6833 Body mass index (BMI) 33.0-33.9, adult: Secondary | ICD-10-CM

## 2023-02-02 DIAGNOSIS — R7303 Prediabetes: Secondary | ICD-10-CM

## 2023-02-02 DIAGNOSIS — E669 Obesity, unspecified: Secondary | ICD-10-CM

## 2023-02-02 DIAGNOSIS — N189 Chronic kidney disease, unspecified: Secondary | ICD-10-CM | POA: Insufficient documentation

## 2023-02-02 DIAGNOSIS — E559 Vitamin D deficiency, unspecified: Secondary | ICD-10-CM | POA: Diagnosis not present

## 2023-02-02 MED ORDER — VITAMIN D (ERGOCALCIFEROL) 1.25 MG (50000 UNIT) PO CAPS: 50000.0000 [IU] | ORAL_CAPSULE | ORAL | 0 refills | Status: DC

## 2023-02-02 MED ORDER — METFORMIN HCL 500 MG PO TABS: 500.0000 mg | ORAL_TABLET | Freq: Two times a day (BID) | ORAL | 0 refills | Status: DC

## 2023-02-02 NOTE — Progress Notes (Signed)
.smr  Office: [phone]  /  Fax: [phone]  WEIGHT SUMMARY AND BIOMETRICS  Anthropometric Measurements Height: 5\' 7"  (1.702 m) Weight: 216 lb (98 kg) BMI (Calculated): 33.82 Weight at Last Visit: 213lb Weight Lost Since Last Visit: 0lb Weight Gained Since Last Visit: 3lb   Body Composition  Body Fat %: 37.3 % Fat Mass (lbs): 80.6 lbs Muscle Mass (lbs): 128.6 lbs Total Body Water (lbs): 83.8 lbs Visceral Fat Rating : 10   Other Clinical Data Fasting: no Labs: no Today's Visit #: 36    Chief Complaint: OBESITY  Discussed the use of AI scribe software for clinical note transcription with the patient, who gave verbal consent to proceed.  Caitlin Stark is here to discuss her progress with her obesity treatment plan. She is on the keeping a food journal and adhering to recommended goals of 1400 calories and 80 protein and states she is following her eating plan approximately 80 % of the time. She states she is exercising 0 minutes 0 times per week.   History of Present Illness   The patient, a teacher with prediabetes, has been struggling with weight loss and adherence to their metformin regimen. They report often forgetting the second dose of metformin, which they aim to take after lunch. They express feeling stagnant in their weight loss journey, experiencing difficulty in maintaining a consistent diet. They describe a rhythm to their eating habits, which they have been unable to regain after a disruption. Their diet varies day-to-day, with some days adhering to healthier choices and others succumbing to convenience foods like pizza. Despite this, they report their caloric intake remains under 2000 calories a day, yet they have noticed a steady weight gain which they find perplexing.  The patient has been trying to incorporate more protein into their diet, often through protein supplements such as bars and shakes. They express a preference for snacks like peanut butter crackers and  granola bars with 10 grams of protein. They also consume a protein milkshake with 15-30 grams of protein. However, they acknowledge that some of these choices may be contributing to their weight gain and express a willingness to make changes.  The patient also reports taking vitamin D supplements, but it is unclear if they have enough for continued use. They have been making an effort to increase their water intake, consuming at least one large bottle of water a day, with additional intake at home. 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          PHYSICAL EXAM:  Blood pressure 121/73, pulse 85, temperature 98.7 F (37.1 C), height 5\' 7"  (1.702 m), weight 216 lb (98 kg), last menstrual period 09/10/2012, SpO2 98 %. Body mass index is 33.83 kg/m.  DIAGNOSTIC DATA REVIEWED:  BMET    Component Value Date/Time   NA 142 09/14/2022 0919   K 4.2 09/14/2022 0919   CL 102 09/14/2022 0919   CO2 25 09/14/2022 0919   GLUCOSE 72 09/14/2022 0919   GLUCOSE 101 (H) 09/04/2019 2211   BUN 13 09/14/2022 0919   CREATININE 1.08 (H) 09/14/2022 0919   CALCIUM 9.9 09/14/2022 0919   GFRNONAA 64 10/14/2020 0731   GFRAA 73 10/14/2020 0731   Lab Results  Component Value Date   HGBA1C 5.6 09/14/2022   HGBA1C 5.9 (H) 09/06/2017   Lab Results  Component Value Date   INSULIN 22.7 09/14/2022   INSULIN 7.0 09/06/2017   Lab Results  Component Value Date  TSH 1.900 09/14/2022   CBC    Component Value Date/Time   WBC 8.0 04/30/2020 0827   WBC 10.8 (H) 09/04/2019 2211   RBC 4.79 04/30/2020 0827   RBC 4.81 09/04/2019 2211   HGB 13.3 04/30/2020 0827   HCT 41.2 04/30/2020 0827   PLT 343 04/30/2020 0827   MCV 86 04/30/2020 0827   MCH 27.8 04/30/2020 0827   MCH 28.3 09/04/2019 2211   MCHC 32.3 04/30/2020 0827   MCHC 31.5 09/04/2019 2211   RDW 14.4 04/30/2020 0827   Iron Studies No results found for: "IRON", "TIBC", "FERRITIN", "IRONPCTSAT" Lipid Panel      Component Value Date/Time   CHOL 154 09/14/2022 0919   TRIG 73 09/14/2022 0919   HDL 47 09/14/2022 0919   CHOLHDL 3.7 08/04/2021 1455   LDLCALC 93 09/14/2022 0919   Hepatic Function Panel     Component Value Date/Time   PROT 7.6 09/14/2022 0919   ALBUMIN 4.6 09/14/2022 0919   AST 19 09/14/2022 0919   ALT 20 09/14/2022 0919   ALKPHOS 131 (H) 09/14/2022 0919   BILITOT 0.3 09/14/2022 0919      Component Value Date/Time   TSH 1.900 09/14/2022 0919   Nutritional Lab Results  Component Value Date   VD25OH 54.0 09/14/2022   VD25OH 44.1 03/23/2022   VD25OH 51.2 12/16/2021     Assessment and Plan    Prediabetes: Struggling with weight loss and adherence to metformin regimen. Discussed the importance of consistent metformin dosing and the role of diet in managing prediabetes. -Continue Metformin, aiming for consistent dosing around meal times. -Consider taking Metformin before lunch to improve adherence. -Encouraged to follow a structured diet plan (Category 2) or continue journaling food intake, focusing on reducing simple carbohydrates and increasing protein intake. -Plan to check Hemoglobin A1c, insulin, kidney and liver function at next visit.  Vitamin D deficiency: Last Vitamin D level was within normal range, but patient may be running low on supplements. -Ensure adequate supply of Vitamin D supplements. -Plan to check Vitamin D level at next visit.  Hydration: Patient reports improved hydration, but could potentially increase water intake. -Aim for 80 ounces of calorie-free, caffeine-free fluids per day.  Follow-up in 4 weeks. If any issues arise before then, patient is encouraged to contact the office.          I have personally spent 51 minutes total time today in preparation, patient care, and documentation for this visit, including the following: review of clinical lab tests; review of medical tests/procedures/services.    She was informed of the importance of  frequent follow up visits to maximize her success with intensive lifestyle modifications for her multiple health conditions.   Quillian Quince, MD

## 2023-02-06 ENCOUNTER — Telehealth (INDEPENDENT_AMBULATORY_CARE_PROVIDER_SITE_OTHER): Payer: Self-pay | Admitting: *Deleted

## 2023-02-06 NOTE — Telephone Encounter (Signed)
PA for submitted for Saxenda. Waiting on approval as noted, no further information required.

## 2023-02-07 NOTE — Telephone Encounter (Signed)
Per CoverMyMeds:  Your PA has been resolved, no additional PA is required. 

## 2023-03-02 ENCOUNTER — Ambulatory Visit (INDEPENDENT_AMBULATORY_CARE_PROVIDER_SITE_OTHER): Payer: BC Managed Care – PPO | Admitting: Family Medicine

## 2023-03-02 ENCOUNTER — Encounter (INDEPENDENT_AMBULATORY_CARE_PROVIDER_SITE_OTHER): Payer: Self-pay | Admitting: Family Medicine

## 2023-03-02 DIAGNOSIS — E669 Obesity, unspecified: Secondary | ICD-10-CM | POA: Diagnosis not present

## 2023-03-02 DIAGNOSIS — Z6833 Body mass index (BMI) 33.0-33.9, adult: Secondary | ICD-10-CM | POA: Diagnosis not present

## 2023-03-02 DIAGNOSIS — R7303 Prediabetes: Secondary | ICD-10-CM

## 2023-03-02 DIAGNOSIS — E559 Vitamin D deficiency, unspecified: Secondary | ICD-10-CM | POA: Diagnosis not present

## 2023-03-02 MED ORDER — VITAMIN D (ERGOCALCIFEROL) 1.25 MG (50000 UNIT) PO CAPS
50000.0000 [IU] | ORAL_CAPSULE | ORAL | 0 refills | Status: DC
Start: 2023-03-02 — End: 2023-09-14

## 2023-03-02 MED ORDER — METFORMIN HCL 500 MG PO TABS: 500.0000 mg | ORAL_TABLET | Freq: Two times a day (BID) | ORAL | 0 refills | Status: DC

## 2023-03-02 NOTE — Progress Notes (Signed)
.smr  Office: [phone]  /  Fax: [phone]  WEIGHT SUMMARY AND BIOMETRICS  Anthropometric Measurements Height: 5\' 7"  (1.702 m) Weight: 216 lb (98 kg) BMI (Calculated): 33.82 Weight at Last Visit: 216 lb Weight Lost Since Last Visit: 0 Weight Gained Since Last Visit: 0 Starting Weight: 197 lb Total Weight Loss (lbs): 0 lb (0 kg)   Body Composition  Body Fat %: 37 % Fat Mass (lbs): 80 lbs Muscle Mass (lbs): 129.2 lbs Total Body Water (lbs): 81.8 lbs Visceral Fat Rating : 10   Other Clinical Data Fasting: no Labs: no Today's Visit #: 37 Starting Date: 09/06/17    Chief Complaint: OBESITY   Discussed the use of AI scribe software for clinical note transcription with the patient, who gave verbal consent to proceed.  History of Present Illness   The patient is a 52 year old female with a history of prediabetes, vitamin D deficiency, and obesity. She has been adhering to a 1400 calorie, 80 gram protein diet approximately 85% of the time and has maintained her weight over the past month. She has also been engaging in regular physical activity, walking for 45 minutes three times a week.  The patient recently traveled to [place] where she found it challenging to maintain her diet due to the local cuisine's lack of vegetables and prevalence of fried foods. Despite these challenges, she managed to maintain her weight during this period.  The patient has been experiencing difficulty with sleep due to a broken air conditioner in her bedroom. She is considering sleeping in a different part of the house until the air conditioner can be repaired.  Regarding her medication, the patient is currently taking metformin for her prediabetes. She was previously on Saxenda, but discontinued it due to cost. Her most recent labs in January showed a vitamin D level of 54 and an A1c of 5.6, indicating good control of her prediabetes.  The patient's goal is to reduce her weight to below  200 pounds, ideally reaching 195 pounds. She is committed to continuing her diet and exercise regimen to achieve this goal.          PHYSICAL EXAM:  Blood pressure 124/74, pulse 65, temperature 98.8 F (37.1 C), height 5\' 7"  (1.702 m), weight 216 lb (98 kg), last menstrual period 09/10/2012, SpO2 99 %. Body mass index is 33.83 kg/m.  DIAGNOSTIC DATA REVIEWED:  BMET    Component Value Date/Time   NA 142 09/14/2022 0919   K 4.2 09/14/2022 0919   CL 102 09/14/2022 0919   CO2 25 09/14/2022 0919   GLUCOSE 72 09/14/2022 0919   GLUCOSE 101 (H) 09/04/2019 2211   BUN 13 09/14/2022 0919   CREATININE 1.08 (H) 09/14/2022 0919   CALCIUM 9.9 09/14/2022 0919   GFRNONAA 64 10/14/2020 0731   GFRAA 73 10/14/2020 0731   Lab Results  Component Value Date   HGBA1C 5.6 09/14/2022   HGBA1C 5.9 (H) 09/06/2017   Lab Results  Component Value Date   INSULIN 22.7 09/14/2022   INSULIN 7.0 09/06/2017   Lab Results  Component Value Date   TSH 1.900 09/14/2022   CBC    Component Value Date/Time   WBC 8.0 04/30/2020 0827   WBC 10.8 (H) 09/04/2019 2211   RBC 4.79 04/30/2020 0827   RBC 4.81 09/04/2019 2211   HGB 13.3 04/30/2020 0827   HCT 41.2 04/30/2020 0827   PLT 343 04/30/2020 0827   MCV 86 04/30/2020 0827   MCH 27.8 04/30/2020 0827  MCH 28.3 09/04/2019 2211   MCHC 32.3 04/30/2020 0827   MCHC 31.5 09/04/2019 2211   RDW 14.4 04/30/2020 0827   Iron Studies No results found for: "IRON", "TIBC", "FERRITIN", "IRONPCTSAT" Lipid Panel     Component Value Date/Time   CHOL 154 09/14/2022 0919   TRIG 73 09/14/2022 0919   HDL 47 09/14/2022 0919   CHOLHDL 3.7 08/04/2021 1455   LDLCALC 93 09/14/2022 0919   Hepatic Function Panel     Component Value Date/Time   PROT 7.6 09/14/2022 0919   ALBUMIN 4.6 09/14/2022 0919   AST 19 09/14/2022 0919   ALT 20 09/14/2022 0919   ALKPHOS 131 (H) 09/14/2022 0919   BILITOT 0.3 09/14/2022 0919      Component Value Date/Time   TSH 1.900  09/14/2022 0919   Nutritional Lab Results  Component Value Date   VD25OH 54.0 09/14/2022   VD25OH 44.1 03/23/2022   VD25OH 51.2 12/16/2021     Assessment and Plan    Prediabetes: Maintained weight despite recent travel and dietary challenges. Adherence to 1400 calorie, 80g protein diet approximately 85% of the time. Last A1C was 5.6 in January. Currently on Metformin. -Continue current diet and Metformin. -Encourage journaling when possible to improve long-term outcomes. -Check A1C and insulin levels at next visit.  Vitamin D deficiency: Last level was 54 in January. Currently on Vitamin D supplementation. -Continue Vitamin D supplementation. -Check Vitamin D level at next visit.  Obesity: Maintained weight over the last month despite travel. Walking 45 minutes three times per week. Goal to lose 16 pounds over the next six months. -Continue current diet and exercise regimen. -Encourage use of portion control and smart choices when unable to journal. -Consider strategies for maintaining exercise routine when school starts. -Return in 4 weeks for follow-up.         I have personally spent 42 minutes total time today in preparation, patient care, and documentation for this visit, including the following: review of clinical lab tests; review of medical tests/procedures/services.    She was informed of the importance of frequent follow up visits to maximize her success with intensive lifestyle modifications for her multiple health conditions.    Quillian Quince, MD

## 2023-03-30 ENCOUNTER — Ambulatory Visit (INDEPENDENT_AMBULATORY_CARE_PROVIDER_SITE_OTHER): Payer: BC Managed Care – PPO | Admitting: Family Medicine

## 2023-03-30 ENCOUNTER — Encounter (INDEPENDENT_AMBULATORY_CARE_PROVIDER_SITE_OTHER): Payer: Self-pay | Admitting: Family Medicine

## 2023-03-30 VITALS — BP 118/72 | HR 78 | Temp 98.2°F | Ht 67.0 in | Wt 218.0 lb

## 2023-03-30 DIAGNOSIS — E669 Obesity, unspecified: Secondary | ICD-10-CM | POA: Diagnosis not present

## 2023-03-30 DIAGNOSIS — Z6834 Body mass index (BMI) 34.0-34.9, adult: Secondary | ICD-10-CM | POA: Diagnosis not present

## 2023-03-30 DIAGNOSIS — R7303 Prediabetes: Secondary | ICD-10-CM | POA: Diagnosis not present

## 2023-03-30 MED ORDER — METFORMIN HCL 500 MG PO TABS
500.0000 mg | ORAL_TABLET | Freq: Two times a day (BID) | ORAL | 0 refills | Status: DC
Start: 2023-03-30 — End: 2023-05-25

## 2023-04-03 NOTE — Progress Notes (Signed)
Chief Complaint:   OBESITY Caitlin Stark is here to discuss her progress with her obesity treatment plan along with follow-up of her obesity related diagnoses. Caitlin Stark is on keeping a food journal and adhering to recommended goals of 1400 calories and 80+ grams of protein and states she is following her eating plan approximately 80% of the time. Caitlin Stark states she is walking for 40 minutes 4-5 times per week.  Today's visit was #: 38 Starting weight: 215 lbs Starting date: 04/30/2020 Today's weight: 218 lbs Today's date: 03/30/2023 Total lbs lost to date: 0 Total lbs lost since last in-office visit: 0  Interim History: Patient has been traveling more recently.  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  Subjective:   1. Prediabetes Patient is struggling to take her second dose of metformin.  She is working on her diet and exercise.  Assessment/Plan:   1. Prediabetes Patient will continue with her diet, exercise, and metformin.  We will refill metformin 500 mg twice daily for 1 month.  - metFORMIN (GLUCOPHAGE) 500 MG tablet; Take 1 tablet (500 mg total) by mouth 2 (two) times daily with a meal.  Dispense: 60 tablet; Refill: 0  2. BMI 34.0-34.9,adult  3. Obesity, Beginning BMI 30.85 Caitlin Stark is currently in the action stage of change. As such, her goal is to continue with weight loss efforts. She has agreed to keeping a food journal and adhering to recommended goals of 1400 calories and 80+ grams of protein daily.   Exercise goals: As is.   Behavioral modification strategies: increasing lean protein intake.  Caitlin Stark has agreed to follow-up with our clinic in 4 weeks. She was informed of the importance of frequent follow-up visits to maximize her success with intensive lifestyle modifications for her multiple health conditions.   Objective:   Blood pressure 118/72, pulse 78, temperature 98.2  F (36.8 C), height 5\' 7"  (1.702 m), weight 218 lb (98.9 kg), last menstrual period 09/10/2012, SpO2 96%. Body mass index is 34.14 kg/m.  Lab Results  Component Value Date   CREATININE 1.08 (H) 09/14/2022   BUN 13 09/14/2022   NA 142 09/14/2022   K 4.2 09/14/2022   CL 102 09/14/2022   CO2 25 09/14/2022   Lab Results  Component Value Date   ALT 20 09/14/2022   AST 19 09/14/2022   ALKPHOS 131 (H) 09/14/2022   BILITOT 0.3 09/14/2022   Lab Results  Component Value Date   HGBA1C 5.6 09/14/2022   HGBA1C 5.4 03/23/2022   HGBA1C 5.8 (H) 08/04/2021   HGBA1C 5.6 10/14/2020   HGBA1C 5.8 (H) 04/30/2020   Lab Results  Component Value Date   INSULIN 22.7 09/14/2022   INSULIN 9.8 03/23/2022   INSULIN 11.2 08/04/2021   INSULIN 14.8 10/14/2020   INSULIN 12.3 04/30/2020   Lab Results  Component Value Date   TSH 1.900 09/14/2022   Lab Results  Component Value Date   CHOL 154 09/14/2022   HDL 47 09/14/2022   LDLCALC 93 09/14/2022   TRIG 73 09/14/2022   CHOLHDL 3.7 08/04/2021   Lab Results  Component Value Date   VD25OH 54.0 09/14/2022   VD25OH 44.1 03/23/2022   VD25OH 51.2 12/16/2021   Lab Results  Component Value Date   WBC 8.0 04/30/2020   HGB 13.3 04/30/2020   HCT 41.2 04/30/2020   MCV 86 04/30/2020   PLT  343 04/30/2020   No results found for: "IRON", "TIBC", "FERRITIN"  Attestation Statements:   Reviewed by clinician on day of visit: allergies, medications, problem list, medical history, surgical history, family history, social history, and previous encounter notes.   I, Burt Knack, am acting as transcriptionist for Quillian Quince, MD.  I have reviewed the above documentation for accuracy and completeness, and I agree with the above. -  Quillian Quince, MD

## 2023-04-27 ENCOUNTER — Other Ambulatory Visit: Payer: Self-pay | Admitting: Family Medicine

## 2023-04-27 ENCOUNTER — Ambulatory Visit (INDEPENDENT_AMBULATORY_CARE_PROVIDER_SITE_OTHER): Payer: BC Managed Care – PPO | Admitting: Family Medicine

## 2023-04-27 NOTE — Telephone Encounter (Signed)
Pt has not been seen since Oct 2023. Refill not appropriate.

## 2023-05-03 ENCOUNTER — Ambulatory Visit: Payer: BC Managed Care – PPO | Admitting: Family Medicine

## 2023-05-03 VITALS — BP 118/78 | HR 67 | Ht 67.0 in | Wt 226.0 lb

## 2023-05-03 DIAGNOSIS — M545 Low back pain, unspecified: Secondary | ICD-10-CM | POA: Diagnosis not present

## 2023-05-03 DIAGNOSIS — G8929 Other chronic pain: Secondary | ICD-10-CM

## 2023-05-03 DIAGNOSIS — M47816 Spondylosis without myelopathy or radiculopathy, lumbar region: Secondary | ICD-10-CM | POA: Diagnosis not present

## 2023-05-03 MED ORDER — MELOXICAM 15 MG PO TABS: 15.0000 mg | ORAL_TABLET | Freq: Every day | ORAL | 1 refills | Status: AC | PRN

## 2023-05-03 NOTE — Progress Notes (Signed)
   Rubin Payor, PhD, LAT, ATC acting as a scribe for Clementeen Graham, MD.  Caitlin Stark is a 52 y.o. female who presents to [facility] today for a flare up of her low back pain. Pt was last seen by Dr. Denyse Amass on 05/04/22 and a L-spine MRI was ordered. Later facet injections were ordered and later performed on Nov 9th.  Today, pt reports much relief from prior facet injections. In May, her family moved into a new home. Pain varies day-to-day. Pt locates pain to the R-side of her low back.   Radiating pain: no LE numbness/tingling: no LE weakness: no Aggravates: increase activity, yard work Treatments tried: meloxicam  Dx imaging: 05/30/22 R hip XR  05/07/22 L-spine MRI 11/25/21 L-spine XR   Pertinent review of systems: ***  Relevant historical information: ***   Exam:  LMP 09/10/2012  General: Well Developed, well nourished, and in no acute distress.   MSK: ***    Lab and Radiology Results No results found for this or any previous visit (from the past 72 hour(s)). No results found.     Assessment and Plan: 52 y.o. female with ***   PDMP not reviewed this encounter. No orders of the defined types were placed in this encounter.  No orders of the defined types were placed in this encounter.    Discussed warning signs or symptoms. Please see discharge instructions. Patient expresses understanding.   ***

## 2023-05-03 NOTE — Patient Instructions (Addendum)
Thank you for coming in today.   Please call [facility] ([facility]) at [phone] to schedule your spine injection.    Let me know if you have any problems.   Ok to use meloxicam while we are getting to the injections.

## 2023-05-19 NOTE — Discharge Instructions (Signed)

## 2023-05-22 ENCOUNTER — Ambulatory Visit
Admission: RE | Admit: 2023-05-22 | Discharge: 2023-05-22 | Disposition: A | Discharge status: Home / Self Care | Payer: BC Managed Care – PPO | Source: Ambulatory Visit | Attending: Family Medicine | Admitting: Family Medicine

## 2023-05-22 DIAGNOSIS — M545 Other chronic pain: Secondary | ICD-10-CM

## 2023-05-22 DIAGNOSIS — M47816 Spondylosis without myelopathy or radiculopathy, lumbar region: Secondary | ICD-10-CM

## 2023-05-22 MED ORDER — METHYLPREDNISOLONE ACETATE 40 MG/ML INJ SUSP (RADIOLOG
80.0000 mg | Freq: Once | INTRAMUSCULAR | Status: AC
  Administered 2023-05-22: 80 mg via INTRA_ARTICULAR

## 2023-05-22 MED ORDER — IOPAMIDOL (ISOVUE-M 200) INJECTION 41%
1.0000 mL | Freq: Once | INTRAMUSCULAR | Status: AC
  Administered 2023-05-22: 1 mL via INTRA_ARTICULAR

## 2023-05-25 ENCOUNTER — Encounter (INDEPENDENT_AMBULATORY_CARE_PROVIDER_SITE_OTHER): Payer: Self-pay | Admitting: Family Medicine

## 2023-05-25 ENCOUNTER — Ambulatory Visit (INDEPENDENT_AMBULATORY_CARE_PROVIDER_SITE_OTHER): Payer: BC Managed Care – PPO | Admitting: Family Medicine

## 2023-05-25 VITALS — BP 113/67 | Temp 98.1°F | Ht 67.0 in | Wt 218.0 lb

## 2023-05-25 DIAGNOSIS — E669 Obesity, unspecified: Secondary | ICD-10-CM | POA: Diagnosis not present

## 2023-05-25 DIAGNOSIS — Z6834 Body mass index (BMI) 34.0-34.9, adult: Secondary | ICD-10-CM | POA: Diagnosis not present

## 2023-05-25 DIAGNOSIS — R7303 Prediabetes: Secondary | ICD-10-CM | POA: Diagnosis not present

## 2023-05-25 DIAGNOSIS — E559 Vitamin D deficiency, unspecified: Secondary | ICD-10-CM | POA: Diagnosis not present

## 2023-05-25 MED ORDER — METFORMIN HCL 500 MG PO TABS
500.0000 mg | ORAL_TABLET | Freq: Two times a day (BID) | ORAL | 0 refills | Status: DC
Start: 2023-05-25 — End: 2023-07-20

## 2023-05-25 NOTE — Progress Notes (Signed)
.smr  Office: [phone]  /  Fax: [phone]  WEIGHT SUMMARY AND BIOMETRICS  Anthropometric Measurements Height: 5\' 7"  (1.702 m) Weight: 218 lb (98.9 kg) BMI (Calculated): 34.14 Weight at Last Visit: 218 lb Weight Lost Since Last Visit: 0 Weight Gained Since Last Visit: 0 Starting Weight: 197 lb Total Weight Loss (lbs): 0 lb (0 kg)   Body Composition  Body Fat %: 38 % Fat Mass (lbs): 83 lbs Muscle Mass (lbs): 128.6 lbs Total Body Water (lbs): 83.6 lbs Visceral Fat Rating : 10   Other Clinical Data Fasting: No Labs: No Today's Visit #: 64 Starting Date: 09/06/17    Chief Complaint: OBESITY  History of Present Illness   The patient, with a history of prediabetes, vitamin D deficiency, and obesity, has been maintaining her weight over the past two months. She has been adhering to a dietary regimen of 1400 calories and 80 grams of protein per day, approximately 90% of the time. However, she is not currently engaged in any exercise regimen. She is on a weekly dose of 50,000 international units of vitamin D, and her last vitamin D level in January was at goal. She is also on metformin 500 mg twice a day. Her last hemoglobin A1c was 5, but her fasting insulin was elevated at 22.7 in January.  The patient has recently adopted an intermittent fasting schedule, eating between 8 am and 4 pm, which she reports is working well for her. She has noticed that eating later in the evening causes her indigestion. She is also trying to ensure that she consumes protein with every meal and snack, but she suspects she may not be reaching her goal of 80 grams per day.  The patient reports that she is doing better with taking her metformin twice a day, although she may miss a day or two. She recently had a second injection for back pain, which she had previously exacerbated by lifting heavy objects. She reports that she is now being more cautious about not overexerting herself.           PHYSICAL EXAM:  Blood pressure 113/67, temperature 98.1 F (36.7 C), height 5\' 7"  (1.702 m), weight 218 lb (98.9 kg), last menstrual period 09/10/2012. Body mass index is 34.14 kg/m.  DIAGNOSTIC DATA REVIEWED:  BMET    Component Value Date/Time   NA 142 09/14/2022 0919   K 4.2 09/14/2022 0919   CL 102 09/14/2022 0919   CO2 25 09/14/2022 0919   GLUCOSE 72 09/14/2022 0919   GLUCOSE 101 (H) 09/04/2019 2211   BUN 13 09/14/2022 0919   CREATININE 1.08 (H) 09/14/2022 0919   CALCIUM 9.9 09/14/2022 0919   GFRNONAA 64 10/14/2020 0731   GFRAA 73 10/14/2020 0731   Lab Results  Component Value Date   HGBA1C 5.6 09/14/2022   HGBA1C 5.9 (H) 09/06/2017   Lab Results  Component Value Date   INSULIN 22.7 09/14/2022   INSULIN 7.0 09/06/2017   Lab Results  Component Value Date   TSH 1.900 09/14/2022   CBC    Component Value Date/Time   WBC 8.0 04/30/2020 0827   WBC 10.8 (H) 09/04/2019 2211   RBC 4.79 04/30/2020 0827   RBC 4.81 09/04/2019 2211   HGB 13.3 04/30/2020 0827   HCT 41.2 04/30/2020 0827   PLT 343 04/30/2020 0827   MCV 86 04/30/2020 0827   MCH 27.8 04/30/2020 0827   MCH 28.3 09/04/2019 2211   MCHC 32.3 04/30/2020 0827   MCHC 31.5 09/04/2019 2211  RDW 14.4 04/30/2020 0827   Iron Studies No results found for: "IRON", "TIBC", "FERRITIN", "IRONPCTSAT" Lipid Panel     Component Value Date/Time   CHOL 154 09/14/2022 0919   TRIG 73 09/14/2022 0919   HDL 47 09/14/2022 0919   CHOLHDL 3.7 08/04/2021 1455   LDLCALC 93 09/14/2022 0919   Hepatic Function Panel     Component Value Date/Time   PROT 7.6 09/14/2022 0919   ALBUMIN 4.6 09/14/2022 0919   AST 19 09/14/2022 0919   ALT 20 09/14/2022 0919   ALKPHOS 131 (H) 09/14/2022 0919   BILITOT 0.3 09/14/2022 0919      Component Value Date/Time   TSH 1.900 09/14/2022 0919   Nutritional Lab Results  Component Value Date   VD25OH 54.0 09/14/2022   VD25OH 44.1 03/23/2022   VD25OH 51.2 12/16/2021     Assessment  and Plan    Prediabetes Patient is on Metformin 500mg  twice daily. Last HbA1c was 5.0, but fasting insulin was elevated at 22.7 in January. Patient reports adherence to medication regimen but admits to occasional missed doses. Patient has implemented intermittent fasting and is maintaining a food journal with a goal of 1400 calories and 80g of protein daily. -Continue Metformin 500mg  twice daily. -Encourage consistent medication adherence. -Continue intermittent fasting and food journaling. -Check HbA1c and fasting insulin at next visit.  Vitamin D Deficiency Patient is on Vitamin D 50,000 IU weekly. Last Vitamin D level in January was at goal. -Continue Vitamin D 50,000 IU weekly. -Check Vitamin D level at next visit.  Obesity Patient has maintained weight since last visit two months ago. She is not currently exercising. She is adhering to a 1400 calorie diet with 80g of protein daily about 90% of the time. -Encourage continuation of current dietary habits. -Encourage initiation of physical activity as tolerated. -Plan for weight check at next visit.  General Health Maintenance / Followup Plans -Next visit to be fasting for lab work. -Schedule follow-up appointments for the next two visits due to anticipated busy fall season.       She was informed of the importance of frequent follow up visits to maximize her success with intensive lifestyle modifications for her multiple health conditions.    Quillian Quince, MD

## 2023-05-29 ENCOUNTER — Encounter (HOSPITAL_BASED_OUTPATIENT_CLINIC_OR_DEPARTMENT_OTHER): Payer: Self-pay

## 2023-05-29 ENCOUNTER — Other Ambulatory Visit: Payer: Self-pay

## 2023-05-29 ENCOUNTER — Emergency Department (HOSPITAL_BASED_OUTPATIENT_CLINIC_OR_DEPARTMENT_OTHER)
Admission: EM | Admit: 2023-05-29 | Discharge: 2023-05-29 | Disposition: A | Discharge status: Home / Self Care | Payer: BC Managed Care – PPO | Attending: Emergency Medicine | Admitting: Emergency Medicine

## 2023-05-29 DIAGNOSIS — U071 COVID-19: Secondary | ICD-10-CM | POA: Diagnosis not present

## 2023-05-29 DIAGNOSIS — I10 Essential (primary) hypertension: Secondary | ICD-10-CM | POA: Insufficient documentation

## 2023-05-29 DIAGNOSIS — R110 Nausea: Secondary | ICD-10-CM | POA: Diagnosis present

## 2023-05-29 LAB — URINALYSIS, ROUTINE W REFLEX MICROSCOPIC
Bilirubin Urine: NEGATIVE
Glucose, UA: NEGATIVE mg/dL
Hgb urine dipstick: NEGATIVE
Ketones, ur: NEGATIVE mg/dL
Leukocytes,Ua: NEGATIVE
Nitrite: NEGATIVE
Specific Gravity, Urine: 1.025 (ref 1.005–1.030)
pH: 6 (ref 5.0–8.0)

## 2023-05-29 LAB — BASIC METABOLIC PANEL
Anion gap: 8 (ref 5–15)
BUN: 16 mg/dL (ref 6–20)
CO2: 29 mmol/L (ref 22–32)
Calcium: 10 mg/dL (ref 8.9–10.3)
Chloride: 104 mmol/L (ref 98–111)
Creatinine, Ser: 1.01 mg/dL — ABNORMAL HIGH (ref 0.44–1.00)
GFR, Estimated: >60 mL/min (ref >60)
Glucose, Bld: 82 mg/dL (ref 70–99)
Potassium: 3.4 mmol/L — ABNORMAL LOW (ref 3.5–5.1)
Sodium: 141 mmol/L (ref 135–145)

## 2023-05-29 LAB — SARS CORONAVIRUS 2 BY RT PCR: SARS Coronavirus 2 by RT PCR: POSITIVE — AB

## 2023-05-29 LAB — TROPONIN I (HIGH SENSITIVITY): Troponin I (High Sensitivity): <2 ng/L (ref <18)

## 2023-05-29 LAB — CBC
HCT: 42.4 % (ref 36.0–46.0)
Hemoglobin: 13.8 g/dL (ref 12.0–15.0)
MCH: 28.3 pg (ref 26.0–34.0)
MCHC: 32.5 g/dL (ref 30.0–36.0)
MCV: 87.1 fL (ref 80.0–100.0)
Platelets: 394 10*3/uL (ref 150–400)
RBC: 4.87 MIL/uL (ref 3.87–5.11)
RDW: 14.1 % (ref 11.5–15.5)
WBC: 12.5 10*3/uL — ABNORMAL HIGH (ref 4.0–10.5)
nRBC: 0 % (ref 0.0–0.2)

## 2023-05-29 LAB — CBG MONITORING, ED
Glucose-Capillary: 66 mg/dL — ABNORMAL LOW (ref 70–99)
Glucose-Capillary: 90 mg/dL (ref 70–99)

## 2023-05-29 MED ORDER — SODIUM CHLORIDE 0.9 % IV BOLUS
1000.0000 mL | Freq: Once | INTRAVENOUS | Status: AC
  Administered 2023-05-29: 1000 mL via INTRAVENOUS

## 2023-05-29 NOTE — Discharge Instructions (Addendum)
Thank you for allowing [place] to be a part of your care today.  You tested positive for COVID-19.  The rest of your workup is overall reassuring.  Your potassium was borderline low, I have attached foods that are high in potassium that you can incorporate into your diet.    Please follow CDC guidelines regarding isolation and mask wearing for COVID.  Current guideline recommend staying home until your symptoms have improved and you are without fever for at least 24 hours without the use of Advil or Tylenol.   Return to the ED if you develop sudden worsening of your symptoms or if you have any new concerns.

## 2023-05-29 NOTE — ED Triage Notes (Signed)
Pt arrived POV from work with family reporting episode of weakness, dizziness, nausea and diaphoresis at work. Pt states she "still doesn't feel right." Pt denies CP and SOB but still c/o dizziness and nausea. Hx diabetes, reports EMS came and checked CBG and it was 110 approx 1 hr ago.

## 2023-05-29 NOTE — ED Notes (Signed)
Pt tolerated po well. Provider meghan informed

## 2023-05-29 NOTE — ED Notes (Signed)
Patient verbalizes understanding of discharge instructions. Opportunity for questioning and answers were provided. Patient discharged from ED. Iv removed x1

## 2023-05-29 NOTE — ED Notes (Signed)
Apple juice provided, pt caox4 and able to drink at this time.

## 2023-05-29 NOTE — ED Provider Notes (Signed)
[place] EMERGENCY DEPARTMENT AT [facility] Provider Note   CSN: [id number] Arrival date & time: 05/29/23  7846     History  Chief Complaint  Patient presents with   Nausea   Dizziness   Near Syncope    Caitlin Stark is a 52 y.o. female with past medical history significant for hypertension, anemia, prediabetes presents to the ED from work reporting an episode of weakness, dizziness, nausea and diaphoresis.  Patient reports feeling better, but does still have mild dizziness. Patient was checked by school nurse who assisted her to a lying position on the floor.  Patient's blood sugar checked prior to ED arrival and it was 110.  Denies fever, chest pain, shortness of breath, palpitations, abdominal pain, vomiting, diarrhea, syncope, headache.       Home Medications Prior to Admission medications   Medication Sig Start Date End Date Taking? Authorizing Provider  diltiazem (DILACOR XR) 180 MG 24 hr capsule Take 180 mg by mouth daily.    [provider]  meloxicam (MOBIC) 15 MG tablet Take 1 tablet (15 mg total) by mouth daily as needed for pain. 05/03/23   Rodolph Bong, MD  metFORMIN (GLUCOPHAGE) 500 MG tablet Take 1 tablet (500 mg total) by mouth 2 (two) times daily with a meal. 05/25/23   Quillian Quince D, MD  spironolactone (ALDACTONE) 25 MG tablet Take 25 mg by mouth as directed. TAKE 2 TABLETS BY MOUTH EVERY MORNING    [provider]  Vitamin D, Ergocalciferol, (DRISDOL) 1.25 MG (50000 UNIT) CAPS capsule Take 1 capsule (50,000 Units total) by mouth every 7 (seven) days. 03/02/23   Quillian Quince D, MD      Allergies    Amoxicillin, Hydrochlorothiazide, Lisinopril, and Morphine and codeine    Review of Systems   Review of Systems  Constitutional:  Positive for diaphoresis.  Eyes:  Negative for visual disturbance.  Respiratory:  Negative for shortness of breath.   Cardiovascular:  Negative for chest pain.  Gastrointestinal:  Positive for  nausea. Negative for abdominal pain, diarrhea and vomiting.  Neurological:  Positive for dizziness and weakness (generalized). Negative for syncope, light-headedness and headaches.    Physical Exam Updated Vital Signs BP 134/79 (BP Location: Left Arm)   Pulse 60   Temp 97.9 F (36.6 C) (Oral)   Resp 18   Ht 5\' 7"  (1.702 m)   Wt 97.5 kg   LMP 09/10/2012   SpO2 100%   BMI 33.67 kg/m  Physical Exam Vitals and nursing note reviewed.  Constitutional:      General: She is not in acute distress.    Appearance: She is not ill-appearing.  HENT:     Mouth/Throat:     Mouth: Mucous membranes are moist.     Pharynx: Oropharynx is clear.  Eyes:     General: Vision grossly intact.     Extraocular Movements: Extraocular movements intact.     Pupils: Pupils are equal, round, and reactive to light.  Cardiovascular:     Rate and Rhythm: Normal rate and regular rhythm.     Pulses: Normal pulses.     Heart sounds: Normal heart sounds.  Pulmonary:     Effort: Pulmonary effort is normal. No tachypnea or respiratory distress.     Breath sounds: Normal breath sounds and air entry.  Abdominal:     General: Abdomen is flat. Bowel sounds are normal. There is no distension.     Palpations: Abdomen is soft.     Tenderness:  There is no abdominal tenderness.  Skin:    General: Skin is warm and dry.     Capillary Refill: Capillary refill takes less than 2 seconds.  Neurological:     Mental Status: She is alert. Mental status is at baseline.  Psychiatric:        Mood and Affect: Mood normal.        Behavior: Behavior normal.     ED Results / Procedures / Treatments   Labs (all labs ordered are listed, but only abnormal results are displayed) Labs Reviewed  SARS CORONAVIRUS 2 BY RT PCR - Abnormal; Notable for the following components:      Result Value   SARS Coronavirus 2 by RT PCR POSITIVE (*)    All other components within normal limits  BASIC METABOLIC PANEL - Abnormal; Notable for the  following components:   Potassium 3.4 (*)    Creatinine, Ser 1.01 (*)    All other components within normal limits  CBC - Abnormal; Notable for the following components:   WBC 12.5 (*)    All other components within normal limits  URINALYSIS, ROUTINE W REFLEX MICROSCOPIC - Abnormal; Notable for the following components:   Protein, ur TRACE (*)    All other components within normal limits  CBG MONITORING, ED - Abnormal; Notable for the following components:   Glucose-Capillary 66 (*)    All other components within normal limits  CBG MONITORING, ED  TROPONIN I (HIGH SENSITIVITY)    EKG EKG Interpretation Date/Time:  Monday May 29 2023 09:14:12 EDT Ventricular Rate:  61 PR Interval:  158 QRS Duration:  83 QT Interval:  424 QTC Calculation: 428 R Axis:   43  Text Interpretation: Sinus rhythm Repol abnrm suggests ischemia, lateral leads No significant change since last tracing Confirmed by Vanetta Mulders [phone]) on 05/29/2023 10:31:23 AM  Radiology No results found.  Procedures Procedures    Medications Ordered in ED Medications  sodium chloride 0.9 % bolus 1,000 mL (0 mLs Intravenous Stopped 05/29/23 1134)    ED Course/ Medical Decision Making/ A&P                                 Medical Decision Making Amount and/or Complexity of Data Reviewed Labs: ordered.   This patient presents to the ED with chief complaint(s) of dizziness, nausea, diaphoresis with pertinent past medical history of hypertension, prediabetes.  The complaint involves an extensive differential diagnosis and also carries with it a high risk of complications and morbidity.    The differential diagnosis includes hypoglycemia, vasovagal near syncope, COVID, other infectious/viral etiology, ACS, cardiac dysrhythmia   The initial plan is to obtain labs, ECG  Additional history obtained: Records reviewed Primary Care Documents  Initial Assessment:   Exam significant for overall well-appearing  patient who is not in acute distress.  Heart rate is normal around 60 with regular rhythm.  No abnormal heart sounds.  Lungs are clear to auscultation bilaterally.  Skin is warm and dry.  Abdomen soft, non-distended and nontender to palpation.  EOM intact without visible nystagmus.  Patient is moving all extremities appropriately.  Independent ECG/labs interpretation:  The following labs were independently interpreted:  CBC with leukocytosis.  Initial CBG of 66.  Metabolic panel with mildly elevated 1.01.  COVID+.  UA without infection.    Treatment and Reassessment: Patient given IV fluids with improvement in her symptoms.  Disposition:   Suspect patient's  symptoms likely related to COVID with probable vasovagal near syncope based on description of symptoms.  Patient has improvement in symptoms following ED interventions of IV fluids.  Discussed with patient continued supportive care for symptoms at home.  The patient has been appropriately medically screened and/or stabilized in the ED. I have low suspicion for any other emergent medical condition which would require further screening, evaluation or treatment in the ED or require inpatient management. At time of discharge the patient is hemodynamically stable and in no acute distress. I have discussed work-up results and diagnosis with patient and answered all questions. Patient is agreeable with discharge plan. We discussed strict return precautions for returning to the emergency department and they verbalized understanding.           Final Clinical Impression(s) / ED Diagnoses Final diagnoses:  COVID-19    Rx / DC Orders ED Discharge Orders     None         Lenard Simmer, PA-C 05/29/23 1808    Vanetta Mulders, MD 06/02/23 1340

## 2023-06-22 ENCOUNTER — Ambulatory Visit (INDEPENDENT_AMBULATORY_CARE_PROVIDER_SITE_OTHER): Payer: BC Managed Care – PPO | Admitting: Family Medicine

## 2023-06-22 ENCOUNTER — Encounter (INDEPENDENT_AMBULATORY_CARE_PROVIDER_SITE_OTHER): Payer: Self-pay | Admitting: Family Medicine

## 2023-06-22 VITALS — BP 127/78 | HR 55 | Temp 98.2°F | Ht 67.0 in | Wt 217.0 lb

## 2023-06-22 DIAGNOSIS — E559 Vitamin D deficiency, unspecified: Secondary | ICD-10-CM | POA: Diagnosis not present

## 2023-06-22 DIAGNOSIS — E669 Obesity, unspecified: Secondary | ICD-10-CM

## 2023-06-22 DIAGNOSIS — Z6834 Body mass index (BMI) 34.0-34.9, adult: Secondary | ICD-10-CM | POA: Diagnosis not present

## 2023-06-22 DIAGNOSIS — R7303 Prediabetes: Secondary | ICD-10-CM

## 2023-06-22 NOTE — Progress Notes (Signed)
.smr  Office: [phone]  /  Fax: [phone]  WEIGHT SUMMARY AND BIOMETRICS  Anthropometric Measurements Height: 5\' 7"  (1.702 m) Weight: 217 lb (98.4 kg) BMI (Calculated): 33.98 Weight at Last Visit: 218 lb Weight Lost Since Last Visit: 1 lb Weight Gained Since Last Visit: 0 Starting Weight: 197 lb Total Weight Loss (lbs): 0 lb (0 kg)   Body Composition  Body Fat %: 38.1 % Fat Mass (lbs): 83 lbs Muscle Mass (lbs): 128 lbs Total Body Water (lbs): 81.6 lbs Visceral Fat Rating : 10   Other Clinical Data Fasting: Yes Labs: Yes Today's Visit #: 40 Starting Date: 09/06/17    Chief Complaint: OBESITY  History of Present Illness   The patient, with a history of obesity, prediabetes, and vitamin D deficiency, presents for a follow-up visit. She reports a weight loss of one pound over the past month, achieved through maintaining a food journal and consuming a diet of 1300-1400 calories per day with at least 80 grams of protein. She estimates adherence to this diet about 70% of the time. For physical activity, she is achieving 15,000 steps on most days.  The patient recently experienced an episode of profuse sweating and near fainting at work, which led to an ER visit. At the time, her blood glucose was 60 despite having eaten. This event prompted her to discontinue metformin, suspecting it might be lowering her blood sugar excessively. She also reports that during the ER visit, she was found to have low potassium and dehydration, and was treated with IV fluids. Interestingly, despite having no symptoms, she tested positive for COVID-19 during the ER visit.  The patient expresses a preference for minimizing medication use if possible. She is open to reevaluating the need for metformin based on upcoming lab results. She also expresses a desire to manage her blood pressure without medication, if possible. She is currently maintaining her vitamin D supplementation.           PHYSICAL EXAM:  Blood pressure 127/78, pulse (!) 55, temperature 98.2 F (36.8 C), height 5\' 7"  (1.702 m), weight 217 lb (98.4 kg), last menstrual period 09/10/2012, SpO2 98%. Body mass index is 33.99 kg/m.  DIAGNOSTIC DATA REVIEWED:  BMET    Component Value Date/Time   NA 141 05/29/2023 0921   NA 142 09/14/2022 0919   K 3.4 (L) 05/29/2023 0921   CL 104 05/29/2023 0921   CO2 29 05/29/2023 0921   GLUCOSE 82 05/29/2023 0921   BUN 16 05/29/2023 0921   BUN 13 09/14/2022 0919   CREATININE 1.01 (H) 05/29/2023 0921   CALCIUM 10.0 05/29/2023 0921   GFRNONAA >60 05/29/2023 0921   GFRAA 73 10/14/2020 0731   Lab Results  Component Value Date   HGBA1C 5.6 09/14/2022   HGBA1C 5.9 (H) 09/06/2017   Lab Results  Component Value Date   INSULIN 22.7 09/14/2022   INSULIN 7.0 09/06/2017   Lab Results  Component Value Date   TSH 1.900 09/14/2022   CBC    Component Value Date/Time   WBC 12.5 (H) 05/29/2023 0921   RBC 4.87 05/29/2023 0921   HGB 13.8 05/29/2023 0921   HGB 13.3 04/30/2020 0827   HCT 42.4 05/29/2023 0921   HCT 41.2 04/30/2020 0827   PLT 394 05/29/2023 0921   PLT 343 04/30/2020 0827   MCV 87.1 05/29/2023 0921   MCV 86 04/30/2020 0827   MCH 28.3 05/29/2023 0921   MCHC 32.5 05/29/2023 0921   RDW 14.1 05/29/2023 0921   RDW  14.4 04/30/2020 0827   Iron Studies No results found for: "IRON", "TIBC", "FERRITIN", "IRONPCTSAT" Lipid Panel     Component Value Date/Time   CHOL 154 09/14/2022 0919   TRIG 73 09/14/2022 0919   HDL 47 09/14/2022 0919   CHOLHDL 3.7 08/04/2021 1455   LDLCALC 93 09/14/2022 0919   Hepatic Function Panel     Component Value Date/Time   PROT 7.6 09/14/2022 0919   ALBUMIN 4.6 09/14/2022 0919   AST 19 09/14/2022 0919   ALT 20 09/14/2022 0919   ALKPHOS 131 (H) 09/14/2022 0919   BILITOT 0.3 09/14/2022 0919      Component Value Date/Time   TSH 1.900 09/14/2022 0919   Nutritional Lab Results  Component Value Date   VD25OH 54.0  09/14/2022   VD25OH 44.1 03/23/2022   VD25OH 51.2 12/16/2021     Assessment and Plan    Obesity 1 pound weight loss over the past month. Adherence to dietary plan and exercise regimen is approximately 70%. -Continue current dietary plan of 1300-1400 calories per day with at least 80 grams of protein. -Continue current exercise regimen of 15,000 steps most days of the week. -Plan to increase protein intake.  Prediabetes Last HbA1c in January was 5.6. Recent hypoglycemic episode with blood sugar of 60 after eating, possibly related to Metformin use. Patient has since stopped Metformin. -Check HbA1c, CMP today. -Decision to restart Metformin will be based on lab results and further discussion.  Vitamin D deficiency Last Vitamin D level in January was 50. Patient has Vitamin D at home. -Check Vitamin D level today. -Continue current Vitamin D supplementation pending lab results.  General Health Maintenance -Check B12 level today. -Plan for follow-up appointment in December.        She was informed of the importance of frequent follow up visits to maximize her success with intensive lifestyle modifications for her multiple health conditions.    Quillian Quince, MD

## 2023-06-23 LAB — INSULIN, RANDOM: INSULIN: 16.9 u[IU]/mL (ref 2.6–24.9)

## 2023-06-23 LAB — LIPID PANEL WITH LDL/HDL RATIO
Cholesterol, Total: 173 mg/dL (ref 100–199)
HDL: 50 mg/dL (ref >39)
LDL Chol Calc (NIH): 106 mg/dL — ABNORMAL HIGH (ref 0–99)
LDL/HDL Ratio: 2.1 {ratio} (ref 0.0–3.2)
Triglycerides: 90 mg/dL (ref 0–149)
VLDL Cholesterol Cal: 17 mg/dL (ref 5–40)

## 2023-06-23 LAB — CMP14+EGFR
ALT: 16 [IU]/L (ref 0–32)
AST: 18 [IU]/L (ref 0–40)
Albumin: 4.5 g/dL (ref 3.8–4.9)
Alkaline Phosphatase: 122 [IU]/L — ABNORMAL HIGH (ref 44–121)
BUN/Creatinine Ratio: 11 (ref 9–23)
BUN: 11 mg/dL (ref 6–24)
Bilirubin Total: 0.3 mg/dL (ref 0.0–1.2)
CO2: 21 mmol/L (ref 20–29)
Calcium: 9.6 mg/dL (ref 8.7–10.2)
Chloride: 105 mmol/L (ref 96–106)
Creatinine, Ser: 1.04 mg/dL — ABNORMAL HIGH (ref 0.57–1.00)
Globulin, Total: 2.8 g/dL (ref 1.5–4.5)
Glucose: 97 mg/dL (ref 70–99)
Potassium: 4.2 mmol/L (ref 3.5–5.2)
Sodium: 142 mmol/L (ref 134–144)
Total Protein: 7.3 g/dL (ref 6.0–8.5)
eGFR: 64 mL/min/{1.73_m2} (ref >59)

## 2023-06-23 LAB — HEMOGLOBIN A1C
Est. average glucose Bld gHb Est-mCnc: 131 mg/dL
Hgb A1c MFr Bld: 6.2 % — ABNORMAL HIGH (ref 4.8–5.6)

## 2023-06-23 LAB — VITAMIN B12: Vitamin B-12: 645 pg/mL (ref 232–1245)

## 2023-06-23 LAB — VITAMIN D 25 HYDROXY (VIT D DEFICIENCY, FRACTURES): Vit D, 25-Hydroxy: 47.2 ng/mL (ref 30.0–100.0)

## 2023-07-20 ENCOUNTER — Ambulatory Visit (INDEPENDENT_AMBULATORY_CARE_PROVIDER_SITE_OTHER): Payer: BC Managed Care – PPO | Admitting: Family Medicine

## 2023-07-20 ENCOUNTER — Encounter (INDEPENDENT_AMBULATORY_CARE_PROVIDER_SITE_OTHER): Payer: Self-pay | Admitting: Family Medicine

## 2023-07-20 VITALS — BP 120/64 | HR 70 | Temp 97.9°F | Ht 67.0 in | Wt 222.0 lb

## 2023-07-20 DIAGNOSIS — R7303 Prediabetes: Secondary | ICD-10-CM

## 2023-07-20 DIAGNOSIS — E785 Hyperlipidemia, unspecified: Secondary | ICD-10-CM | POA: Diagnosis not present

## 2023-07-20 DIAGNOSIS — Z6834 Body mass index (BMI) 34.0-34.9, adult: Secondary | ICD-10-CM

## 2023-07-20 DIAGNOSIS — F3289 Other specified depressive episodes: Secondary | ICD-10-CM | POA: Diagnosis not present

## 2023-07-20 DIAGNOSIS — E669 Obesity, unspecified: Secondary | ICD-10-CM

## 2023-07-20 DIAGNOSIS — E559 Vitamin D deficiency, unspecified: Secondary | ICD-10-CM

## 2023-07-20 DIAGNOSIS — E782 Mixed hyperlipidemia: Secondary | ICD-10-CM | POA: Insufficient documentation

## 2023-07-20 MED ORDER — METFORMIN HCL 500 MG PO TABS
500.0000 mg | ORAL_TABLET | Freq: Two times a day (BID) | ORAL | 0 refills | Status: AC
Start: 2023-07-20 — End: ?

## 2023-07-20 MED ORDER — TOPIRAMATE 50 MG PO TABS
ORAL_TABLET | ORAL | 0 refills | Status: AC
Start: 2023-07-20 — End: ?

## 2023-07-20 NOTE — Progress Notes (Signed)
.smr  Office: [phone]  /  Fax: [phone]  WEIGHT SUMMARY AND BIOMETRICS  Anthropometric Measurements Height: 5\' 7"  (1.702 m) Weight: 222 lb (100.7 kg) BMI (Calculated): 34.76 Weight at Last Visit: 217 lb Weight Lost Since Last Visit: 0 Weight Gained Since Last Visit: 5 lb Starting Weight: 197 lb Total Weight Loss (lbs): 0 lb (0 kg)   Body Composition  Body Fat %: 39 % Fat Mass (lbs): 86.8 lbs Muscle Mass (lbs): 129.2 lbs Total Body Water (lbs): 83.6 lbs Visceral Fat Rating : 10   Other Clinical Data Fasting: No Labs: No Today's Visit #: 41 Starting Date: 09/06/17    Chief Complaint: OBESITY   History of Present Illness   The patient, with a history of prediabetes, vitamin D deficiency, hyperlipidemia, and obesity, presents for a follow-up visit. Recent labs indicate an increase in A1c to 6.2, an increase in LDL from 93 to 106, and a decrease in vitamin D from 54 to 47. The patient has gained five pounds since the last visit a month ago. She reports adherence to a 1400 calorie diet with a goal of 80 grams of protein approximately 85% of the time. She also reports walking for exercise and achieving 10,000 steps at least five times a week.  The patient reports feeling stagnant in her progress and expresses fatigue with her current regimen. She has stopped taking metformin and is interested in exploring other treatment options. She reports struggling to meet her protein intake goal recently, which she attributes to feeling mentally tired.  The patient also reports an incident at school where her blood sugar dropped to 60 after eating a peanut butter and jelly sandwich. She expresses uncertainty about whether this was due to the metformin or another cause. She has some metformin at home and is considering giving it another try.  The patient is interested in exploring new treatment options for weight loss and blood sugar control. She expresses a desire for a medication  similar to metformin but different, although she understands that metformin is unique in its category. She has some metformin at home and a refill at the pharmacy.          PHYSICAL EXAM:  Blood pressure 120/64, pulse 70, temperature 97.9 F (36.6 C), height 5\' 7"  (1.702 m), weight 222 lb (100.7 kg), last menstrual period 09/10/2012, SpO2 97%. Body mass index is 34.77 kg/m.  DIAGNOSTIC DATA REVIEWED:  BMET    Component Value Date/Time   NA 142 06/22/2023 0841   K 4.2 06/22/2023 0841   CL 105 06/22/2023 0841   CO2 21 06/22/2023 0841   GLUCOSE 97 06/22/2023 0841   GLUCOSE 82 05/29/2023 0921   BUN 11 06/22/2023 0841   CREATININE 1.04 (H) 06/22/2023 0841   CALCIUM 9.6 06/22/2023 0841   GFRNONAA >60 05/29/2023 0921   GFRAA 73 10/14/2020 0731   Lab Results  Component Value Date   HGBA1C 6.2 (H) 06/22/2023   HGBA1C 5.9 (H) 09/06/2017   Lab Results  Component Value Date   INSULIN 16.9 06/22/2023   INSULIN 7.0 09/06/2017   Lab Results  Component Value Date   TSH 1.900 09/14/2022   CBC    Component Value Date/Time   WBC 12.5 (H) 05/29/2023 0921   RBC 4.87 05/29/2023 0921   HGB 13.8 05/29/2023 0921   HGB 13.3 04/30/2020 0827   HCT 42.4 05/29/2023 0921   HCT 41.2 04/30/2020 0827   PLT 394 05/29/2023 0921   PLT 343 04/30/2020 0827  MCV 87.1 05/29/2023 0921   MCV 86 04/30/2020 0827   MCH 28.3 05/29/2023 0921   MCHC 32.5 05/29/2023 0921   RDW 14.1 05/29/2023 0921   RDW 14.4 04/30/2020 0827   Iron Studies No results found for: "IRON", "TIBC", "FERRITIN", "IRONPCTSAT" Lipid Panel     Component Value Date/Time   CHOL 173 06/22/2023 0841   TRIG 90 06/22/2023 0841   HDL 50 06/22/2023 0841   CHOLHDL 3.7 08/04/2021 1455   LDLCALC 106 (H) 06/22/2023 0841   Hepatic Function Panel     Component Value Date/Time   PROT 7.3 06/22/2023 0841   ALBUMIN 4.5 06/22/2023 0841   AST 18 06/22/2023 0841   ALT 16 06/22/2023 0841   ALKPHOS 122 (H) 06/22/2023 0841   BILITOT  0.3 06/22/2023 0841      Component Value Date/Time   TSH 1.900 09/14/2022 0919   Nutritional Lab Results  Component Value Date   VD25OH 47.2 06/22/2023   VD25OH 54.0 09/14/2022   VD25OH 44.1 03/23/2022     Assessment and Plan    Prediabetes A1c increased to 6.2 from 5.4-5.8. Discussed metformin, GLP-1s, and topiramate for weight loss and glycemic control. Previous hypoglycemia on metformin led to discontinuation. - Refill metformin - Monitor A1c and blood glucose -continue diet and weight loss to prevent DM  Obesity and Emotional Eating Behaviors Gained 5 pounds in the last month. Following a 1400 calorie diet with 80 grams of protein 85% of the time and walking 10,000 steps five times a week. Discussed topiramate for weight loss and importance of protein intake to prevent metabolic slowdown. Topiramate can aid in consistent weight loss for those stalled in her efforts. - Continue current diet and exercise regimen - Start topiramate 50 mg daily as discussed - Monitor weight and dietary adherence  Hyperlipidemia LDL increased from 93 to 106, possibly related to increased A1c and weight gain. Improving glycemic control and weight loss expected to reduce LDL levels. - Monitor lipid levels  Vitamin D Deficiency Vitamin D level decreased from 54 to 47, below the goal of 50-60. Emphasized importance of maintaining adequate levels, especially during shorter daylight periods. - Restart vitamin D supplementation  Follow-up - Schedule follow-up appointment in December - Consider January appointment if needed.       I have personally spent 40 minutes total time today in preparation, patient care, and documentation for this visit, including the following: review of clinical lab tests; review of medical tests/procedures/services.    She was informed of the importance of frequent follow up visits to maximize her success with intensive lifestyle modifications for her multiple health  conditions.    Quillian Quince, MD

## 2023-08-08 ENCOUNTER — Encounter: Payer: Self-pay | Admitting: Family Medicine

## 2023-08-08 ENCOUNTER — Ambulatory Visit: Payer: BC Managed Care – PPO | Admitting: Family Medicine

## 2023-08-08 ENCOUNTER — Ambulatory Visit (INDEPENDENT_AMBULATORY_CARE_PROVIDER_SITE_OTHER): Payer: BC Managed Care – PPO

## 2023-08-08 VITALS — BP 132/82 | HR 66 | Ht 67.0 in | Wt 229.0 lb

## 2023-08-08 DIAGNOSIS — G8929 Other chronic pain: Secondary | ICD-10-CM

## 2023-08-08 DIAGNOSIS — M47816 Spondylosis without myelopathy or radiculopathy, lumbar region: Secondary | ICD-10-CM

## 2023-08-08 DIAGNOSIS — M545 Low back pain, unspecified: Secondary | ICD-10-CM | POA: Diagnosis not present

## 2023-08-08 NOTE — Patient Instructions (Addendum)
Thank you for coming in today.  Dr. Mosetta Putt at [facility]  Please call [facility] ([facility]ally [facility]) at [phone] to schedule your spine injection.    Please get an Xray today before you leave

## 2023-08-08 NOTE — Progress Notes (Unsigned)
I, Stevenson Clinch, CMA acting as a scribe for Clementeen Graham, MD.  Caitlin Stark is a 52 y.o. female who presents to [facility] today for exacerbation of her back pain. Pt was last seen by Dr. Denyse Amass on 05/03/23 and repeat facet injections were order, done on 05/22/23. Meloxicam was also refilled.   Today, pt reports less relief of lower back sx after last injection. Notes having shooting pain up her back towards her neck during the procedure. Didn't really get any relief from the last injection. Interested in repeat injection with the provider that did the previous injection.   Dx imaging: 05/30/22 R hip XR             05/07/22 L-spine MRI 11/25/21 L-spine XR   Pertinent review of systems: No fevers or chills  Relevant historical information: Hypertension   Exam:  BP 132/82   Pulse 66   Ht 5\' 7"  (1.702 m)   Wt 229 lb (103.9 kg)   LMP 09/10/2012   SpO2 99%   BMI 35.87 kg/m  General: Well Developed, well nourished, and in no acute distress.   MSK: L-spine nontender palpation spinal midline. Normal lumbar motion  X-ray images lumbar spine obtained today personally and independently interpreted. Mild degenerative changes.  No acute fractures visible. Await formal radiology review  Assessment and Plan: 52 y.o. female with chronic low back pain.  Pain thought to be due to facet arthritis.  She had good results with facet injections right L4-5 and L5-S1 a year ago with Dr. Mosetta Putt.  The same procedure performed more recently by different radiologist was less effective and felt different.  Looking at the images it looks the same to me but I think is worthwhile trying it again with Dr. Mosetta Putt specifically.  If this is not sufficiently helpful would proceed with repeat lumbar spine MRI. Lumbar spine x-ray updated today.  PDMP not reviewed this encounter. Orders Placed This Encounter  Procedures   DG FACET JT INJ L /S SINGLE LEVEL RIGHT W/FL/CT    Standing  Status:   Future    Standing Expiration Date:   08/07/2024    Scheduling Instructions:     Dr Mosetta Putt Please    Order Specific Question:   Reason for Exam (SYMPTOM  OR DIAGNOSIS REQUIRED)    Answer:   right L4-5 and L5-S1 facet injections    Order Specific Question:   Is the patient pregnant?    Answer:   No    Order Specific Question:   Preferred Imaging Location?    Answer:   [facility]    Order Specific Question:   Radiology Contrast Protocol - do NOT remove file path    Answer:   [email or web address]   DG FACET JT INJ L /S 2ND LEVEL RIGHT W/FL/CT    Standing Status:   Future    Standing Expiration Date:   08/07/2024    Scheduling Instructions:     Dr Mosetta Putt please    Order Specific Question:   Reason for Exam (SYMPTOM  OR DIAGNOSIS REQUIRED)    Answer:   right L4-5 and L5-S1 facet injections    Order Specific Question:   Is the patient pregnant?    Answer:   No    Order Specific Question:   Preferred Imaging Location?    Answer:   [facility]    Order Specific Question:   Radiology Contrast Protocol - do NOT remove file path  Answer:   [email or web address]   DG Lumbar Spine 2-3 Views    Standing Status:   Future    Number of Occurrences:   1    Standing Expiration Date:   08/07/2024    Order Specific Question:   Reason for Exam (SYMPTOM  OR DIAGNOSIS REQUIRED)    Answer:   eval back pain    Order Specific Question:   Is patient pregnant?    Answer:   No    Order Specific Question:   Preferred imaging location?    Answer:   [facility]   No orders of the defined types were placed in this encounter.    Discussed warning signs or symptoms. Please see discharge instructions. Patient expresses understanding.   The above documentation has been reviewed and is accurate and complete Clementeen Graham, M.D.

## 2023-08-09 NOTE — Addendum Note (Signed)
Addended by: Rodolph Bong on: 08/09/2023 05:39 AM   Modules accepted: Level of Service

## 2023-08-17 ENCOUNTER — Ambulatory Visit (INDEPENDENT_AMBULATORY_CARE_PROVIDER_SITE_OTHER): Payer: BC Managed Care – PPO | Admitting: Family Medicine

## 2023-08-21 ENCOUNTER — Encounter: Payer: Self-pay | Admitting: Family Medicine

## 2023-08-21 NOTE — Progress Notes (Signed)
Lumbar spine x-ray shows medium arthritis in places.

## 2023-08-25 NOTE — Discharge Instructions (Signed)

## 2023-08-28 ENCOUNTER — Ambulatory Visit
Admission: RE | Admit: 2023-08-28 | Discharge: 2023-08-28 | Disposition: A | Discharge status: Home / Self Care | Payer: BC Managed Care – PPO | Source: Ambulatory Visit | Attending: Family Medicine | Admitting: Family Medicine

## 2023-08-28 DIAGNOSIS — M47816 Spondylosis without myelopathy or radiculopathy, lumbar region: Secondary | ICD-10-CM

## 2023-08-28 DIAGNOSIS — M545 Low back pain, unspecified: Secondary | ICD-10-CM

## 2023-08-28 MED ORDER — METHYLPREDNISOLONE ACETATE 40 MG/ML INJ SUSP (RADIOLOG
80.0000 mg | Freq: Once | INTRAMUSCULAR | Status: AC
  Administered 2023-08-28: 80 mg via INTRA_ARTICULAR

## 2023-08-28 MED ORDER — IOPAMIDOL (ISOVUE-M 200) INJECTION 41%
1.0000 mL | Freq: Once | INTRAMUSCULAR | Status: AC
  Administered 2023-08-28: 1 mL via INTRA_ARTICULAR

## 2023-09-14 ENCOUNTER — Encounter (INDEPENDENT_AMBULATORY_CARE_PROVIDER_SITE_OTHER): Payer: Self-pay | Admitting: Family Medicine

## 2023-09-14 ENCOUNTER — Ambulatory Visit (INDEPENDENT_AMBULATORY_CARE_PROVIDER_SITE_OTHER): Payer: Self-pay | Admitting: Family Medicine

## 2023-09-14 VITALS — BP 119/67 | HR 65 | Temp 97.9°F | Ht 67.0 in | Wt 223.0 lb

## 2023-09-14 DIAGNOSIS — Z6834 Body mass index (BMI) 34.0-34.9, adult: Secondary | ICD-10-CM

## 2023-09-14 DIAGNOSIS — I10 Essential (primary) hypertension: Secondary | ICD-10-CM

## 2023-09-14 DIAGNOSIS — R7303 Prediabetes: Secondary | ICD-10-CM

## 2023-09-14 DIAGNOSIS — N951 Menopausal and female climacteric states: Secondary | ICD-10-CM

## 2023-09-14 DIAGNOSIS — M549 Dorsalgia, unspecified: Secondary | ICD-10-CM

## 2023-09-14 DIAGNOSIS — Z6835 Body mass index (BMI) 35.0-35.9, adult: Secondary | ICD-10-CM

## 2023-09-14 DIAGNOSIS — E559 Vitamin D deficiency, unspecified: Secondary | ICD-10-CM

## 2023-09-14 DIAGNOSIS — E669 Obesity, unspecified: Secondary | ICD-10-CM

## 2023-09-14 DIAGNOSIS — G8929 Other chronic pain: Secondary | ICD-10-CM

## 2023-09-14 MED ORDER — VITAMIN D (ERGOCALCIFEROL) 1.25 MG (50000 UNIT) PO CAPS: 50000.0000 [IU] | ORAL_CAPSULE | ORAL | 0 refills | Status: AC

## 2023-09-14 MED ORDER — RYBELSUS 3 MG PO TABS
3.0000 mg | ORAL_TABLET | Freq: Every day | ORAL | 0 refills | Status: AC
Start: 2023-09-14 — End: ?

## 2023-09-14 NOTE — Progress Notes (Signed)
 .smr  Office: [phone]  /  Fax: [phone]  WEIGHT SUMMARY AND BIOMETRICS  Anthropometric Measurements Height: 5' 7 (1.702 m) Weight: 223 lb (101.2 kg) BMI (Calculated): 34.92 Weight at Last Visit: 222 lb Weight Lost Since Last Visit: 0 Weight Gained Since Last Visit: 1 lb Starting Weight: 197 lb Total Weight Loss (lbs): 0 lb (0 kg)   Body Composition  Body Fat %: 38.3 % Fat Mass (lbs): 85.6 lbs Muscle Mass (lbs): 131 lbs Total Body Water (lbs): 83.2 lbs Visceral Fat Rating : 10   Other Clinical Data Fasting: No Labs: No Today's Visit #: 42 Starting Date: 09/06/17    Chief Complaint: OBESITY   History of Present Illness   The patient, with a history of obesity, vitamin D  deficiency, and hypertension, presents for a routine follow-up. She reports a recent weight gain of one pound over the past two months, attributed to dietary indiscretions during the holiday season. She expresses a desire to refocus on weight loss and has begun to incorporate exercise into her routine.  The patient has a history of back pain, for which she received an injection just before Christmas. She reports significant improvement in her symptoms, with no current pain radiating down her leg, a symptom she experienced prior to the injection.  The patient also reports fatigue and sleep disturbances, which she attributes to menopausal symptoms. She is considering hormone replacement therapy via a transdermal patch to alleviate these symptoms.  Regarding her medication regimen, the patient is currently on metformin , which she plans to adjust by taking the second dose at dinner instead of lunch due to midday discomfort. She also expresses dissatisfaction with the lack of significant weight loss while on metformin .  The patient is also on prescription vitamin D  and diltiazem, with the latter effectively controlling her blood pressure. She requests a refill of her vitamin D  prescription.           PHYSICAL EXAM:  Blood pressure 119/67, pulse 65, temperature 97.9 F (36.6 C), height 5' 7 (1.702 m), weight 223 lb (101.2 kg), last menstrual period 09/10/2012, SpO2 96%. Body mass index is 34.93 kg/m.  DIAGNOSTIC DATA REVIEWED:  BMET    Component Value Date/Time   NA 142 06/22/2023 0841   K 4.2 06/22/2023 0841   CL 105 06/22/2023 0841   CO2 21 06/22/2023 0841   GLUCOSE 97 06/22/2023 0841   GLUCOSE 82 05/29/2023 0921   BUN 11 06/22/2023 0841   CREATININE 1.04 (H) 06/22/2023 0841   CALCIUM 9.6 06/22/2023 0841   GFRNONAA >60 05/29/2023 0921   GFRAA 73 10/14/2020 0731   Lab Results  Component Value Date   HGBA1C 6.2 (H) 06/22/2023   HGBA1C 5.9 (H) 09/06/2017   Lab Results  Component Value Date   INSULIN  16.9 06/22/2023   INSULIN  7.0 09/06/2017   Lab Results  Component Value Date   TSH 1.900 09/14/2022   CBC    Component Value Date/Time   WBC 12.5 (H) 05/29/2023 0921   RBC 4.87 05/29/2023 0921   HGB 13.8 05/29/2023 0921   HGB 13.3 04/30/2020 0827   HCT 42.4 05/29/2023 0921   HCT 41.2 04/30/2020 0827   PLT 394 05/29/2023 0921   PLT 343 04/30/2020 0827   MCV 87.1 05/29/2023 0921   MCV 86 04/30/2020 0827   MCH 28.3 05/29/2023 0921   MCHC 32.5 05/29/2023 0921   RDW 14.1 05/29/2023 0921   RDW 14.4 04/30/2020 0827   Iron Studies No results found for: IRON, TIBC,  FERRITIN, IRONPCTSAT Lipid Panel     Component Value Date/Time   CHOL 173 06/22/2023 0841   TRIG 90 06/22/2023 0841   HDL 50 06/22/2023 0841   CHOLHDL 3.7 08/04/2021 1455   LDLCALC 106 (H) 06/22/2023 0841   Hepatic Function Panel     Component Value Date/Time   PROT 7.3 06/22/2023 0841   ALBUMIN 4.5 06/22/2023 0841   AST 18 06/22/2023 0841   ALT 16 06/22/2023 0841   ALKPHOS 122 (H) 06/22/2023 0841   BILITOT 0.3 06/22/2023 0841      Component Value Date/Time   TSH 1.900 09/14/2022 0919   Nutritional Lab Results  Component Value Date   VD25OH 47.2 06/22/2023   VD25OH 54.0  09/14/2022   VD25OH 44.1 03/23/2022     Assessment and Plan    Obesity and prediabetes Gained one pound over the last two months. Discussed the importance of structured diet and exercise. Considered GLP-1 medication for weight management. Discussed Rybelsus  (GLP-1) in pill form, which works better than metformin  for weight loss. Explained that Rybelsus  needs to be taken on an empty stomach with a little water, wait half an hour before eating, and that it may cause nausea if not timed correctly. Mentioned that Rybelsus  is FDA approved for diabetes and may be covered by insurance. - Prescribe Rybelsus  (GLP-1) in pill form, starting with the lowest dose - Continue metformin  - Encourage structured diet and exercise plan  Chronic Back Pain Received a recent injection for back pain at L4-L5, which has improved symptoms. No longer experiencing leg pain or weakness. Plans to resume exercise with modifications. Discussed the importance of avoiding certain exercises like deadlifts and asking the coach for modifications. - Monitor symptoms and adjust exercise regimen as needed - Follow up with Dr. Derrill for further injections if necessary  Hypertension Well-controlled with diltiazem 180 mg daily. Current blood pressure is 119/67 mmHg. - Continue diltiazem 180 mg daily  Menopausal Symptoms Reports fatigue and possible menopausal symptoms. Blood work showed low estrogen and progesterone levels. Considering hormone replacement therapy. Discussed the potential benefits of hormone replacement therapy through the skin, such as patches, to avoid risks like blood clots. - Monitor symptoms and consider hormone replacement therapy if symptoms persist  Vitamin D  Deficiency On prescription vitamin D  and requests a refill. - Refill prescription vitamin D   Follow-up - Send Rybelsus  and vitamin D  prescriptions to [facility] - Follow up on the effectiveness of Rybelsus  and any side effects - Monitor  blood pressure and back pain symptoms - Update on hormone replacement therapy progress.        She was informed of the importance of frequent follow up visits to maximize her success with intensive lifestyle modifications for her multiple health conditions.    Louann Penton, MD

## 2023-09-18 ENCOUNTER — Telehealth: Payer: Self-pay | Admitting: *Deleted

## 2023-09-18 NOTE — Telephone Encounter (Signed)
Prior authorization done via cover my meds for patients Rybelsus. Waiting on determination.  

## 2023-09-18 NOTE — Telephone Encounter (Signed)
 Patients prior authorization was denied for her Rybelsus .  Your plan only covers this drug when it is used for certain health conditions. Covered use is for type 2 diabetes mellitus. Your plan does not cover the drug for your health condition that your doctor told us  you have. We reviewed the information we had. Your request has been denied.

## 2023-10-12 ENCOUNTER — Other Ambulatory Visit (INDEPENDENT_AMBULATORY_CARE_PROVIDER_SITE_OTHER): Payer: Self-pay | Admitting: Family Medicine

## 2023-10-12 DIAGNOSIS — E559 Vitamin D deficiency, unspecified: Secondary | ICD-10-CM

## 2023-10-16 ENCOUNTER — Other Ambulatory Visit (INDEPENDENT_AMBULATORY_CARE_PROVIDER_SITE_OTHER): Payer: Self-pay | Admitting: Family Medicine

## 2023-10-16 DIAGNOSIS — E559 Vitamin D deficiency, unspecified: Secondary | ICD-10-CM

## 2023-10-25 ENCOUNTER — Ambulatory Visit (INDEPENDENT_AMBULATORY_CARE_PROVIDER_SITE_OTHER): Payer: Self-pay | Admitting: Family Medicine

## 2023-11-15 ENCOUNTER — Ambulatory Visit (INDEPENDENT_AMBULATORY_CARE_PROVIDER_SITE_OTHER): Payer: Self-pay | Admitting: Family Medicine

## 2023-11-20 ENCOUNTER — Encounter (INDEPENDENT_AMBULATORY_CARE_PROVIDER_SITE_OTHER): Payer: Self-pay

## 2023-12-20 ENCOUNTER — Other Ambulatory Visit (INDEPENDENT_AMBULATORY_CARE_PROVIDER_SITE_OTHER): Payer: Self-pay | Admitting: Family Medicine

## 2023-12-20 DIAGNOSIS — R7303 Prediabetes: Secondary | ICD-10-CM
